# Patient Record
Sex: Female | Born: 1935
Health system: Southern US, Community
[De-identification: ages and names within clinical notes are randomized; demographics above are authoritative.]

## PROBLEM LIST (undated history)

## (undated) DIAGNOSIS — M1990 Unspecified osteoarthritis, unspecified site: Secondary | ICD-10-CM

## (undated) DIAGNOSIS — I4891 Unspecified atrial fibrillation: Secondary | ICD-10-CM

## (undated) DIAGNOSIS — I071 Rheumatic tricuspid insufficiency: Secondary | ICD-10-CM

## (undated) DIAGNOSIS — K219 Gastro-esophageal reflux disease without esophagitis: Secondary | ICD-10-CM

## (undated) DIAGNOSIS — E871 Hypo-osmolality and hyponatremia: Secondary | ICD-10-CM

## (undated) DIAGNOSIS — J449 Chronic obstructive pulmonary disease, unspecified: Secondary | ICD-10-CM

## (undated) DIAGNOSIS — I340 Nonrheumatic mitral (valve) insufficiency: Secondary | ICD-10-CM

## (undated) DIAGNOSIS — K469 Unspecified abdominal hernia without obstruction or gangrene: Secondary | ICD-10-CM

## (undated) DIAGNOSIS — I10 Essential (primary) hypertension: Secondary | ICD-10-CM

## (undated) DIAGNOSIS — I509 Heart failure, unspecified: Secondary | ICD-10-CM

## (undated) HISTORY — DX: Unspecified atrial fibrillation: I48.91

## (undated) HISTORY — DX: Rheumatic tricuspid insufficiency: I07.1

## (undated) HISTORY — PX: ABDOMINAL HYSTERECTOMY: SHX81

## (undated) HISTORY — DX: Gastro-esophageal reflux disease without esophagitis: K21.9

## (undated) HISTORY — PX: OTHER SURGICAL HISTORY: SHX169

## (undated) HISTORY — DX: Chronic obstructive pulmonary disease, unspecified: J44.9

## (undated) HISTORY — DX: Nonrheumatic mitral (valve) insufficiency: I34.0

## (undated) HISTORY — DX: Unspecified osteoarthritis, unspecified site: M19.90

## (undated) HISTORY — DX: Hypo-osmolality and hyponatremia: E87.1

## (undated) HISTORY — DX: Morbid (severe) obesity due to excess calories: E66.01

---

## 2011-01-17 DIAGNOSIS — I4891 Unspecified atrial fibrillation: Secondary | ICD-10-CM

## 2011-01-21 ENCOUNTER — Observation Stay (HOSPITAL_COMMUNITY)
Admission: EM | Admit: 2011-01-21 | Discharge: 2011-01-23 | Disposition: A | Discharge status: Home / Self Care | Payer: Medicare Other | Attending: Internal Medicine | Admitting: Internal Medicine

## 2011-01-21 ENCOUNTER — Emergency Department (HOSPITAL_COMMUNITY): Payer: Medicare Other

## 2011-01-21 DIAGNOSIS — R143 Flatulence: Secondary | ICD-10-CM | POA: Insufficient documentation

## 2011-01-21 DIAGNOSIS — I4891 Unspecified atrial fibrillation: Secondary | ICD-10-CM | POA: Insufficient documentation

## 2011-01-21 DIAGNOSIS — R0609 Other forms of dyspnea: Secondary | ICD-10-CM | POA: Insufficient documentation

## 2011-01-21 DIAGNOSIS — M6281 Muscle weakness (generalized): Secondary | ICD-10-CM | POA: Insufficient documentation

## 2011-01-21 DIAGNOSIS — Z7901 Long term (current) use of anticoagulants: Secondary | ICD-10-CM | POA: Insufficient documentation

## 2011-01-21 DIAGNOSIS — K5900 Constipation, unspecified: Secondary | ICD-10-CM | POA: Insufficient documentation

## 2011-01-21 DIAGNOSIS — R197 Diarrhea, unspecified: Secondary | ICD-10-CM | POA: Insufficient documentation

## 2011-01-21 DIAGNOSIS — R141 Gas pain: Secondary | ICD-10-CM | POA: Insufficient documentation

## 2011-01-21 DIAGNOSIS — N179 Acute kidney failure, unspecified: Secondary | ICD-10-CM | POA: Insufficient documentation

## 2011-01-21 DIAGNOSIS — I10 Essential (primary) hypertension: Secondary | ICD-10-CM | POA: Insufficient documentation

## 2011-01-21 DIAGNOSIS — R1013 Epigastric pain: Principal | ICD-10-CM | POA: Insufficient documentation

## 2011-01-21 DIAGNOSIS — R0989 Other specified symptoms and signs involving the circulatory and respiratory systems: Secondary | ICD-10-CM | POA: Insufficient documentation

## 2011-01-21 DIAGNOSIS — I8390 Asymptomatic varicose veins of unspecified lower extremity: Secondary | ICD-10-CM | POA: Insufficient documentation

## 2011-01-21 DIAGNOSIS — R142 Eructation: Secondary | ICD-10-CM | POA: Insufficient documentation

## 2011-01-21 HISTORY — DX: Essential (primary) hypertension: I10

## 2011-01-21 HISTORY — DX: Unspecified abdominal hernia without obstruction or gangrene: K46.9

## 2011-01-21 HISTORY — DX: Heart failure, unspecified: I50.9

## 2011-01-21 LAB — BASIC METABOLIC PANEL
BUN: 22 mg/dL (ref 6–23)
Chloride: 96 mEq/L (ref 96–112)
Glucose, Bld: 144 mg/dL — ABNORMAL HIGH (ref 70–99)
Potassium: 3.6 mEq/L (ref 3.5–5.1)

## 2011-01-21 LAB — DIFFERENTIAL
Eosinophils Relative: 0 % (ref 0–5)
Lymphocytes Relative: 19 % (ref 12–46)
Lymphs Abs: 1.9 10*3/uL (ref 0.7–4.0)
Monocytes Relative: 10 % (ref 3–12)

## 2011-01-21 LAB — CBC
HCT: 43.7 % (ref 36.0–46.0)
MCV: 91.8 fL (ref 78.0–100.0)
RBC: 4.76 MIL/uL (ref 3.87–5.11)
RDW: 13.2 % (ref 11.5–15.5)
WBC: 9.6 10*3/uL (ref 4.0–10.5)

## 2011-01-21 LAB — HEPATIC FUNCTION PANEL
ALT: 61 U/L — ABNORMAL HIGH (ref 0–35)
AST: 53 U/L — ABNORMAL HIGH (ref 0–37)
Albumin: 4.3 g/dL (ref 3.5–5.2)
Alkaline Phosphatase: 52 U/L (ref 39–117)
Total Protein: 8.3 g/dL (ref 6.0–8.3)

## 2011-01-21 LAB — POCT CARDIAC MARKERS: Troponin i, poc: <0.05 ng/mL (ref 0.00–0.09)

## 2011-01-22 ENCOUNTER — Emergency Department (HOSPITAL_COMMUNITY): Payer: Medicare Other

## 2011-01-22 ENCOUNTER — Encounter (HOSPITAL_COMMUNITY): Payer: Self-pay | Admitting: Radiology

## 2011-01-22 LAB — BASIC METABOLIC PANEL
CO2: 27 mEq/L (ref 19–32)
Chloride: 94 mEq/L — ABNORMAL LOW (ref 96–112)
GFR calc Af Amer: 57 mL/min — ABNORMAL LOW (ref >60)
Potassium: 3 mEq/L — ABNORMAL LOW (ref 3.5–5.1)

## 2011-01-22 LAB — URINE MICROSCOPIC-ADD ON

## 2011-01-22 LAB — URINALYSIS, ROUTINE W REFLEX MICROSCOPIC
Ketones, ur: 15 mg/dL — AB
Leukocytes, UA: NEGATIVE
Nitrite: NEGATIVE
Specific Gravity, Urine: 1.015 (ref 1.005–1.030)
pH: 8 (ref 5.0–8.0)

## 2011-01-22 LAB — DIFFERENTIAL
Basophils Relative: 0 % (ref 0–1)
Eosinophils Absolute: 0.1 10*3/uL (ref 0.0–0.7)
Lymphocytes Relative: 16 % (ref 12–46)
Monocytes Absolute: 1.7 10*3/uL — ABNORMAL HIGH (ref 0.1–1.0)

## 2011-01-22 LAB — CBC
Hemoglobin: 14.2 g/dL (ref 12.0–15.0)
MCH: 30.9 pg (ref 26.0–34.0)
MCV: 91.9 fL (ref 78.0–100.0)
Platelets: 211 10*3/uL (ref 150–400)
RBC: 4.59 MIL/uL (ref 3.87–5.11)
WBC: 12.5 10*3/uL — ABNORMAL HIGH (ref 4.0–10.5)

## 2011-01-22 MED ORDER — IOHEXOL 300 MG/ML  SOLN
100.0000 mL | Freq: Once | INTRAMUSCULAR | Status: AC | PRN
  Administered 2011-01-22: 100 mL via INTRAVENOUS

## 2011-01-23 ENCOUNTER — Encounter: Payer: Self-pay | Admitting: *Deleted

## 2011-01-23 LAB — CBC
HCT: 41.2 % (ref 36.0–46.0)
MCH: 31.7 pg (ref 26.0–34.0)
MCV: 92.6 fL (ref 78.0–100.0)
RBC: 4.45 MIL/uL (ref 3.87–5.11)
WBC: 13.8 10*3/uL — ABNORMAL HIGH (ref 4.0–10.5)

## 2011-01-23 LAB — BASIC METABOLIC PANEL
CO2: 27 mEq/L (ref 19–32)
Chloride: 95 mEq/L — ABNORMAL LOW (ref 96–112)
GFR calc Af Amer: 36 mL/min — ABNORMAL LOW (ref >60)
Glucose, Bld: 131 mg/dL — ABNORMAL HIGH (ref 70–99)
Potassium: 4 mEq/L (ref 3.5–5.1)
Sodium: 132 mEq/L — ABNORMAL LOW (ref 135–145)

## 2011-01-24 NOTE — Discharge Summary (Signed)
[name], [name] NO.:  [id number]  MEDICAL RECORD NO.:  [id number]           PATIENT TYPE:  O  LOCATION:  6739                         FACILITY:  [facility]  PHYSICIAN:  Andreas Blower, MD       DATE OF BIRTH:  1931-10-28  DATE OF ADMISSION:  01/21/2011 DATE OF DISCHARGE:  01/23/2011                              DISCHARGE SUMMARY   PRIMARY CARE PHYSICIAN:  The patient does not have one, is planning on scheduling an appointment with Dr. Margo Aye.  DISCHARGE DIAGNOSES: 1. Abdominal pain, etiology unclear, maybe due to constipation. 2. Constipation. 3. Atrial fibrillation. 4. Hypertension. 5. Acute renal failure, likely due to contrast-induced nephropathy. 6. Possible diagnosis of congestive heart failure, uncertain if it is     diastolic or systolic.  She was diagnosed recently at [facility]  Records are unavailable.  DISCHARGE MEDICATIONS: 1. Robitussin DM 100-10 mg per 5 mL, 5 mL every 6 hours as needed for     cough. 2. Lisinopril 5 mg p.o. daily. 3. Polyethylene glycol 17 g p.o. daily for constipation, hold if any     diarrhea. 4. Senna 1 tablet p.o. daily at bedtime for constipation, hold if any     diarrhea. 5. Simethicone 80 mg p.o. 3 times daily as needed for abdominal pain     and distention. 6. Flonase nasal spray 1 spray daily as needed for allergies. 7. Furosemide 20 mg p.o. daily. 8. Metoprolol 25 mg p.o. twice daily. 9. Potassium chloride 20 mEq p.o. daily. 10.Albuterol 2 puffs every 4 hours as needed. 11.Warfarin 5 mg p.o. daily.  BRIEF ADMITTING HISTORY AND PHYSICAL:  [name] is a 77 year old Caucasian female with history of hypertension, recent diagnosis of AFib, and congestive heart failure who presented with abdominal distention and belching for the last 3-4 days.  RADIOLOGY/IMAGING: 1. The patient had CT of abdomen and pelvis which showed cardiomegaly     without congestive heart failure.  Left base scar probably  atelectasis of the right lung base. 2. The patient had abdominal x-ray on January 22, 2011, which showed     nonobstructive bowel-gas pattern. 3. The patient had CT of the abdomen and pelvis with contrast which     showed abnormal left nephrogram suggesting pyelonephritis and no     acute changes identified.  LABORATORY DATA:  CBC shows a white count of 13.8, hemoglobin 14.1, hematocrit 41.2, and platelet count 218.  INR 1.51.  Electrolytes showed sodium is 132, potassium 4.0, chloride 96, CO2 of 27, BUN 30, and creatinine 1.68.  Initial creatinine on presentation was 1.18.  Liver function tests normal except AST was 53 and ALT is 61.  Magnesium is 2.1.  BNP is 298.  UA was negative for nitrites and leukocytes.  HOSPITAL COURSE BY PROBLEM: 1. Abdominal pain, etiology unclear.  The patient has had CT of the     abdomen and pelvis which did show any significant findings.  Her CT     did show abnormal left pyelogram suggesting pyelonephritis,     however, her UA was negative for nitrites and leukocytes.  The  patient did not complain of any flank pain or any dysuria.  The     patient initially was empirically started on ciprofloxacin,     however, this was discontinued given that the patient did not have     any other symptoms to suggest pyelonephritis.  For her abdominal     pain, she was also started on MiraLax and senna.  She was also     given simethicone.  During the course hospital stay, her abdominal     pain and distention significantly improved.  I instructed the     patient that she should follow up with her primary care physician.     If the patient continues to have abdominal symptoms, could consider     referral to GI for further workup and evaluation. 2. AFib, new diagnosis at [facility].  The patient's heart rate     was controlled.  Continue the patient on Coumadin.  At the time of     discharge, her INR is 1.51. 3. Hypertension.  The patient's blood pressure has  been in the low     100s.  As a result, her hydrochlorothiazide has been discontinued     and her lisinopril dose was also decreased from 20 mg to 5 mg.     Further titration of antihypertensive medications to be done as an     outpatient.  The patient was continued on metoprolol 25 mg twice     daily. 4. Acute renal failure.  The raise in creatinine is most likely due to     the CT of the abdomen and pelvis that she received with IV     contrast.  I suspect her renal failure is due to contrast-induced     nephropathy.  The patient was instructed to adequately hydrate     herself and to have the lab work checked at the next clinic visit.     Have lab work checked at the next clinic visit with her primary     care physician.  DISPOSITION AND FOLLOWUP:  The patient to follow with Dr. Margo Aye.  She was given Dr. Scharlene Gloss telephone number to call and make an appointment.  Home health PT will be arranged for her.  The patient was also instructed that given her rising creatinine she should have her renal panel checked at the next clinic visit.  Time spent on discharge talking to the patient, talking to the patient's son, and coordinating care was 35 minutes.     Andreas Blower, MD     SR/MEDQ  D:  01/23/2011  T:  01/24/2011  Job:  [id number]  Electronically Signed by Wardell Heath Zaeem Kandel  on 01/24/2011 08:40:58 PM

## 2011-01-27 NOTE — H&P (Signed)
NAMEELLISON, LEISURE NO.:  [id number]  MEDICAL RECORD NO.:  [id number]           PATIENT TYPE:  LOCATION:                                 FACILITY:  PHYSICIAN:  Jeoffrey Massed, MD    DATE OF BIRTH:  1932-04-26  DATE OF ADMISSION: DATE OF DISCHARGE:                             HISTORY & PHYSICAL   PRIMARY CARE PRACTITIONER:  The patient does not have one.  CHIEF COMPLAINT:  Belching, abdominal distention x3-4 days.  HISTORY OF PRESENT ILLNESS:  The patient is a 77 year old female with past medical history of hypertension recently diagnosed with CHF and atrial fibrillation, comes to the hospital with the above-noted complaints.  Per the patient, this all started approximately a week ago when she started having weakness, abdominal distention, and pain along with belching and burping.  She went to [facility] where she was diagnosed with atrial fibrillation and CHF.  She was then given Coumadin and Lovenox and discharged home.  While over at [facility], the patient was noted to have excessive belching, burping, and abdominal distention.  She was apparently not having any vomiting or constipation.  Upon discharge, the patient continued to have symptoms of belching and burping.  She therefore went to [facility] and now was evaluated there and discharged.  She now comes to [facility] for similar complaints.  She claims that she has vomited as well as today. She has had no appetite.  She had very small bowel movements today.  She also is passing of flatus.  The patient claims that her abdomen and she is not even able to put her regular sized pants on as well, so at this point the patient is being admitted for further workup and evaluation. The patient also has been complaining of abdominal pain and claims that every time she presses on her belly she gets burps.  She has no fever. She has no diarrhea.  There is no chest pain.  There  is no shortness of breath.  There are no headaches.  ALLERGIES:  None.  PAST MEDICAL HISTORY: 1. Hypertension. 2. Arthritis. 3. Recent diagnosis of atrial fibrillation, apparently is on     overlapping Lovenox and Coumadin. 4. Was told at [facility] that she might have some CHF as well.  PAST SURGICAL HISTORY:  The patient has had a hysterectomy and C- section.  Medications at home include the following: 1. Ventolin inhaler 2 puffs inhaled q.4 p.r.n. 2. Flonase 1 spray daily p.r.n. 3. Lopressor 25 mg 1 tablet twice daily. 4. Potassium chloride 20 mEq 1 tablet daily. 5. Lasix 20 mg 1 tablet daily. 6. Lisinopril/hydrochlorothiazide 10/12.5 one tablet daily. 7. Coumadin 5 mg 1 tablet daily.  FAMILY HISTORY:  Noncontributory.  SOCIAL HISTORY:  Nonsmoker, lives alone.  Denies any toxic habits.  REVIEW OF SYSTEMS:  A detailed review of 12 systems was done and these are negative except for the ones mentioned in the HPI.  PHYSICAL EXAMINATION:  VITAL SIGNS:  Afebrile, heart rate of 76, blood 129/93, respiration of 20. GENERAL:  Lying in bed, does not appear to be in any discomfort, is  burping during my evaluation. HEENT:  Atraumatic, normocephalic.  Pupils equally react to light and accommodation. NECK:  Supple. CHEST:  Bilaterally clear to auscultation. CARDIOVASCULAR:  Heart sounds are irregular, slightly tachycardic.  No murmurs heard. ABDOMEN:  Slightly distended.  Bowel sounds are present.  There is mild but diffuse tenderness without any rebound or rigidity. EXTREMITIES:  No edema, plus chronic varicose veins present. NEUROLOGIC:  The patient is alert and oriented and has no focal neurological deficits on exam.  LABORATORY DATA:  CBC shows a WBC of 9.6, hemoglobin of 15.2, hematocrit of 43.7, and a platelet count of 233,000.  Chemistry shows sodium of 133, potassium of 3.6, chloride of 96, bicarb of 24, glucose of 144, BUN of 22, creatinine of 1.18, and a calcium of 9.7.   BNP is 298.  INR 1.16. LFT shows AST of 53, ALT of 61, alkaline phosphatase of 52, albumin of 4.3.  point-of-care cardiac markers are negative. Abdominal x-ray shows a nonobstructive bowel gas pattern.  X-ray of the chest shows cardiomegaly without congestive failure.  Left base scar with possible atelectasis of the right lung base.  If there is strong concern for right lower lobe pneumonia. Short-term radiographic follow up should be considered.  ASSESSMENT: 1. Burping, belching, abdominal distention, rule out constipation,     rule out ileus. 2. Atrial fibrillation with the heart rate being in the 90s-100s     range. 3. Hypertension currently controlled. 4. On Coumadin therapy for atrial fibrillation with a subtherapeutic     INR.  PLAN: 1. The patient will be brought in as an observation. 2. In regard to atrial fibrillation, she will be continue on Lopressor     and we will place her on a heparin drip and only resume Coumadin on     discharge if no significant findings seen on the CT of the abdomen     and no procedures contemplated. 3. In regard to her belching and burping, we will get a CT scan of her     abdomen to make sure she does not have ileus or an occult     obstruction as these symptoms have been persistent for a week and     have not gotten better with outpatient therapy. 4. We will provide her with some stool softeners and see if these     symptoms resolve with bowel movements. 5. Further plan will depend as the patient's clinical course evolves.     Further radiological studies are obtained. 6. An echocardiogram will also be obtained. 7. The patient apparently has been scheduled to see [facility]     at [facility] for atrial fibrillation and will need to do so on     discharge. 8. DVT prophylaxis is not needed as the patient will be on heparin     drip.  CODE STATUS:  Full code.  TOTAL TIME SPENT:  Forty five minutes.     Jeoffrey Massed,  MD     SG/MEDQ  D:  01/22/2011  T:  01/22/2011  Job:  [id number]  Electronically Signed by Jeoffrey Massed  on 01/27/2011 08:34:26 PM

## 2011-01-31 ENCOUNTER — Ambulatory Visit (INDEPENDENT_AMBULATORY_CARE_PROVIDER_SITE_OTHER): Payer: Medicare Other | Admitting: *Deleted

## 2011-01-31 DIAGNOSIS — Z7901 Long term (current) use of anticoagulants: Secondary | ICD-10-CM

## 2011-01-31 DIAGNOSIS — I4891 Unspecified atrial fibrillation: Secondary | ICD-10-CM | POA: Insufficient documentation

## 2011-01-31 LAB — POCT INR: INR: 1.7

## 2011-02-03 ENCOUNTER — Other Ambulatory Visit: Payer: Self-pay | Admitting: *Deleted

## 2011-02-03 MED ORDER — WARFARIN SODIUM 5 MG PO TABS: 5.0000 mg | ORAL_TABLET | ORAL | Status: DC

## 2011-02-14 ENCOUNTER — Ambulatory Visit (INDEPENDENT_AMBULATORY_CARE_PROVIDER_SITE_OTHER): Payer: Medicare Other | Admitting: *Deleted

## 2011-02-14 DIAGNOSIS — I4891 Unspecified atrial fibrillation: Secondary | ICD-10-CM

## 2011-02-14 DIAGNOSIS — Z7901 Long term (current) use of anticoagulants: Secondary | ICD-10-CM

## 2011-02-14 LAB — POCT INR: INR: 2.8

## 2011-02-18 ENCOUNTER — Ambulatory Visit (INDEPENDENT_AMBULATORY_CARE_PROVIDER_SITE_OTHER): Payer: Medicare Other | Admitting: Cardiology

## 2011-02-18 ENCOUNTER — Encounter: Payer: Self-pay | Admitting: Cardiology

## 2011-02-18 ENCOUNTER — Encounter: Payer: Self-pay | Admitting: *Deleted

## 2011-02-18 DIAGNOSIS — IMO0002 Reserved for concepts with insufficient information to code with codable children: Secondary | ICD-10-CM | POA: Insufficient documentation

## 2011-02-18 DIAGNOSIS — I071 Rheumatic tricuspid insufficiency: Secondary | ICD-10-CM

## 2011-02-18 DIAGNOSIS — I059 Rheumatic mitral valve disease, unspecified: Secondary | ICD-10-CM

## 2011-02-18 DIAGNOSIS — I10 Essential (primary) hypertension: Secondary | ICD-10-CM

## 2011-02-18 DIAGNOSIS — I079 Rheumatic tricuspid valve disease, unspecified: Secondary | ICD-10-CM

## 2011-02-18 DIAGNOSIS — I509 Heart failure, unspecified: Secondary | ICD-10-CM

## 2011-02-18 DIAGNOSIS — I4891 Unspecified atrial fibrillation: Secondary | ICD-10-CM

## 2011-02-18 DIAGNOSIS — I340 Nonrheumatic mitral (valve) insufficiency: Secondary | ICD-10-CM

## 2011-02-18 MED ORDER — FLUTICASONE PROPIONATE 50 MCG/ACT NA SUSP: 2.0000 | Freq: Every day | NASAL | Status: DC

## 2011-02-18 MED ORDER — LISINOPRIL 5 MG PO TABS: 5.0000 mg | ORAL_TABLET | Freq: Every day | ORAL | Status: DC

## 2011-02-18 MED ORDER — POTASSIUM CHLORIDE 20 MEQ/15ML (10%) PO LIQD: 20.0000 meq | Freq: Every day | ORAL | Status: AC

## 2011-02-18 MED ORDER — RANITIDINE HCL 150 MG PO TABS: 150.0000 mg | ORAL_TABLET | Freq: Every day | ORAL | Status: DC

## 2011-02-18 MED ORDER — CARVEDILOL 3.125 MG PO TABS: 3.1250 mg | ORAL_TABLET | Freq: Two times a day (BID) | ORAL | Status: DC

## 2011-02-18 NOTE — Assessment & Plan Note (Signed)
I will follow this up with an echocardiogram along with mitral regurgitation in one year.

## 2011-02-18 NOTE — Assessment & Plan Note (Addendum)
Stable at this time.  Pt is euvolemic on exam.  She will continue lisinopril, lopressor has been changed to coreg, potassium and lasix.  Continue low sodium diet.  Will follow.   Of note the patient does not want to have a cardiac catheterization and we again discussed this.

## 2011-02-18 NOTE — Progress Notes (Deleted)
HPI   No Known Allergies  Current Outpatient Prescriptions  Medication Sig Dispense Refill  . albuterol (PROVENTIL HFA;VENTOLIN HFA) 108 (90 BASE) MCG/ACT inhaler Inhale 2 puffs into the lungs every 4 (four) hours as needed.        . fluticasone (FLONASE) 50 MCG/ACT nasal spray 2 sprays by Nasal route daily.        Marland Kitchen lisinopril (PRINIVIL,ZESTRIL) 5 MG tablet Take 5 mg by mouth daily.        . metoprolol tartrate (LOPRESSOR) 25 MG tablet Take 25 mg by mouth 2 (two) times daily.        . polyethylene glycol (MIRALAX / GLYCOLAX) packet Take 17 g by mouth daily.        . potassium chloride SA (K-DUR,KLOR-CON) 20 MEQ tablet Take 20 mEq by mouth daily.        Marland Kitchen senna (SENOKOT) 8.6 MG tablet Take 1 tablet by mouth daily.        . simethicone (MYLICON) 80 MG chewable tablet Chew 80 mg by mouth 3 (three) times daily as needed.        . warfarin (COUMADIN) 5 MG tablet Take 1 tablet (5 mg total) by mouth as directed.  45 tablet  3  . DISCONTD: furosemide (LASIX) 20 MG tablet Take 20 mg by mouth daily.        Marland Kitchen DISCONTD: guaiFENesin-dextromethorphan (ROBITUSSIN DM) 100-10 MG/5ML syrup Take 5 mLs by mouth every 6 (six) hours as needed.          Past Medical History  Diagnosis Date  . CHF (congestive heart failure)     EF  30%  Echo 01/17/11  . Hypertension   . Atrial fib/flutter, transient   . Hernia   . Hyposmolality and/or hyponatremia   . Esophageal reflux   . Chronic airway obstruction, not elsewhere classified   . Morbid obesity   . Osteoarthrosis, unspecified whether generalized or localized, unspecified site   . Mitral regurgitation   . Moderate tricuspid regurgitation by prior echocardiogram     Past Surgical History  Procedure Date  . Abdominal hysterectomy   . Right lower extremity surgery     ROS:  Nasal congestion.  Otherwise negative for all other systems.  PHYSICAL EXAM BP 108/75  Pulse 74  Ht 5\' 5"  (1.651 m)  Wt 193 lb (87.544 kg)  BMI 32.12 kg/m2 GENERAL:  Well  appearing HEENT:  Pupils equal round and reactive, fundi not visualized, oral mucosa unremarkable NECK:  No jugular venous distention, waveform within normal limits, carotid upstroke brisk and symmetric, no bruits, no thyromegaly LYMPHATICS:  No cervical, inguinal adenopathy LUNGS:  Clear to auscultation bilaterally BACK:  No CVA tenderness CHEST:  Unremarkable HEART:  PMI not displaced or sustained,S1 and S2 within normal limits, no S3, no S4, no clicks, no rubs, no murmurs ABD:  Flat, positive bowel sounds normal in frequency in pitch, no bruits, no rebound, no guarding, no midline pulsatile mass, no hepatomegaly, no splenomegaly EXT:  2 plus pulses throughout, no edema, no cyanosis no clubbing SKIN:  No rashes no nodules NEURO:  Cranial nerves II through XII grossly intact, motor grossly intact throughout PSYCH:  Cognitively intact, oriented to person place and time  EKG:  Atrial fibrillation, right bundle branch block, anterior T-wave inversion unchanged from previous  ASSESSMENT AND PLAN

## 2011-02-18 NOTE — Assessment & Plan Note (Addendum)
Stable at this time.  Pt will need repeat echo in 12 months.

## 2011-02-18 NOTE — Progress Notes (Signed)
HPI This is a 77 year old female with newly diagnosed Afib on 01/17/11 while being evaluated for increased SOB at [facility].  The pt was placed on coumadin and metoprolol for rate control.  A 2D echo showed an ejection fraction of 30-35% as well as Mod MR/TR.  Further evaluation for etiology of cardiomyopathy with cardiac catherization to exclude ischemic cause were discussed with the patient and she did not wish to pursue catherization during that time.    The patient has been doing well since discharge.  She has had physical therapy visits and feel they are helping with strength.  She is completing daily exercises without CP or increased SOB.  She walks approx 260 feet twice daily.  Her dyspnea is back at baseline per patient.  She denies any DOE, orthopnea, PND or lower extremity edema.  She continues to sleep on two pillows but this is per usual.  Patient denies any chest pain/pressure.  She denies recent fever/chills but does complain of nasal congestion.  Her constipation and abdominal pain has improved immensely with the addition of Zantac 150 mg daily.    Pt is seen today for hospital follow up.    No Known Allergies  Current Outpatient Prescriptions  Medication Sig Dispense Refill  . albuterol (PROVENTIL HFA;VENTOLIN HFA) 108 (90 BASE) MCG/ACT inhaler Inhale 2 puffs into the lungs every 4 (four) hours as needed.        . fluticasone (FLONASE) 50 MCG/ACT nasal spray 2 sprays by Nasal route daily.        Marland Kitchen lisinopril (PRINIVIL,ZESTRIL) 5 MG tablet Take 5 mg by mouth daily.        . metoprolol tartrate (LOPRESSOR) 25 MG tablet Take 25 mg by mouth 2 (two) times daily.        . polyethylene glycol (MIRALAX / GLYCOLAX) packet Take 17 g by mouth daily.        . potassium chloride SA (K-DUR,KLOR-CON) 20 MEQ tablet Take 20 mEq by mouth daily.        Marland Kitchen senna (SENOKOT) 8.6 MG tablet Take 1 tablet by mouth daily.        . simethicone (MYLICON) 80 MG chewable tablet Chew 80 mg by mouth 3 (three)  times daily as needed.        . warfarin (COUMADIN) 5 MG tablet Take 1 tablet (5 mg total) by mouth as directed.  45 tablet  3  . DISCONTD: furosemide (LASIX) 20 MG tablet Take 20 mg by mouth daily.        Marland Kitchen DISCONTD: guaiFENesin-dextromethorphan (ROBITUSSIN DM) 100-10 MG/5ML syrup Take 5 mLs by mouth every 6 (six) hours as needed.          Past Medical History  Diagnosis Date  . CHF (congestive heart failure)     EF  30%  Echo 01/17/11  . Hypertension   . Atrial fib/flutter, transient   . Hernia   . Hyposmolality and/or hyponatremia   . Esophageal reflux   . Chronic airway obstruction, not elsewhere classified   . Morbid obesity   . Osteoarthrosis, unspecified whether generalized or localized, unspecified site   . Mitral regurgitation   . Moderate tricuspid regurgitation by prior echocardiogram     Past Surgical History  Procedure Date  . Abdominal hysterectomy   . Right lower extremity surgery     ROS: Nasal Congestion.  All other systems reviewed and are negative.   PHYSICAL EXAM BP 108/75  Pulse 74  Ht 5\' 5"  (1.651  m)  Wt 193 lb (87.544 kg)  BMI 32.12 kg/m2 GENERAL:  Well appearing HEENT:  Pupils equal round and reactive, fundi not visualized, oral mucosa unremarkable NECK:  No jugular venous distention, waveform within normal limits, carotid upstroke brisk and symmetric, no bruits, no thyromegaly LYMPHATICS:  No cervical, inguinal adenopathy LUNGS:  Clear to auscultation bilaterally BACK:  No CVA tenderness CHEST:  Unremarkable HEART:  PMI not displaced or sustained,S1 and S2 within normal limits, no S3, no S4, no clicks, no rubs, 2/6 murmur apical  ABD:  Flat, positive bowel sounds normal in frequency in pitch, no bruits, no rebound, no guarding, no midline pulsatile mass, no hepatomegaly, no splenomegaly, obese EXT:  2 plus pulses throughout, no edema, no cyanosis no clubbing SKIN:  No rashes no nodules NEURO:  Cranial nerves II through XII grossly intact, motor  grossly intact throughout PSYCH:  Cognitively intact, oriented to person place and time  EKG:  Atrial fibrillation, right bundle branch block, no change from previous  ASSESSMENT AND PLAN

## 2011-02-18 NOTE — Progress Notes (Deleted)
   Patient ID: [name], female    DOB: Jul 19, 1932, 77 y.o.   MRN: [id number]  HPI    Review of Systems    Physical Exam

## 2011-02-18 NOTE — Patient Instructions (Addendum)
   Follow up in 6 weeks as scheduled.  Start Coreg (carvedilol) 3.125mg  twice per day.  Stop Lopressor (metoprolol).  Start Zantax 150mg  daily.

## 2011-02-18 NOTE — Assessment & Plan Note (Signed)
Stable.  Pt will continue current antihypertensives.  Lopressor will be changed to coreg. And

## 2011-02-18 NOTE — Assessment & Plan Note (Addendum)
Pt remains in Afib, currently rate controlled.  At this time pt will continue coumadin with a CHADS2 score of at least 2.  I will change her lopressor to coreg for better rate control.  She is tolerating her afib well and cardioversion is not needed.  Patient has again refused catherization at the present time but will reconsider if symptoms worsen, I feel this is adequate.  If Afib persist can consider event monitor for further monitoring.

## 2011-03-04 ENCOUNTER — Ambulatory Visit (INDEPENDENT_AMBULATORY_CARE_PROVIDER_SITE_OTHER): Payer: Medicare Other | Admitting: *Deleted

## 2011-03-04 DIAGNOSIS — Z7901 Long term (current) use of anticoagulants: Secondary | ICD-10-CM

## 2011-03-04 DIAGNOSIS — I4891 Unspecified atrial fibrillation: Secondary | ICD-10-CM

## 2011-03-09 ENCOUNTER — Emergency Department (HOSPITAL_COMMUNITY)
Admission: EM | Admit: 2011-03-09 | Discharge: 2011-03-09 | Disposition: A | Discharge status: Home / Self Care | Payer: Medicare Other | Attending: Emergency Medicine | Admitting: Emergency Medicine

## 2011-03-09 ENCOUNTER — Emergency Department (HOSPITAL_COMMUNITY): Payer: Medicare Other

## 2011-03-09 DIAGNOSIS — Z79899 Other long term (current) drug therapy: Secondary | ICD-10-CM | POA: Insufficient documentation

## 2011-03-09 DIAGNOSIS — I4891 Unspecified atrial fibrillation: Secondary | ICD-10-CM | POA: Insufficient documentation

## 2011-03-09 DIAGNOSIS — I509 Heart failure, unspecified: Secondary | ICD-10-CM | POA: Insufficient documentation

## 2011-03-09 DIAGNOSIS — Z7901 Long term (current) use of anticoagulants: Secondary | ICD-10-CM | POA: Insufficient documentation

## 2011-03-09 DIAGNOSIS — I10 Essential (primary) hypertension: Secondary | ICD-10-CM | POA: Insufficient documentation

## 2011-03-09 DIAGNOSIS — M109 Gout, unspecified: Secondary | ICD-10-CM | POA: Insufficient documentation

## 2011-03-09 LAB — CBC
HCT: 39.3 % (ref 36.0–46.0)
Hemoglobin: 13.3 g/dL (ref 12.0–15.0)
MCH: 30.9 pg (ref 26.0–34.0)
MCV: 91.4 fL (ref 78.0–100.0)
RBC: 4.3 MIL/uL (ref 3.87–5.11)
WBC: 11.7 10*3/uL — ABNORMAL HIGH (ref 4.0–10.5)

## 2011-03-09 LAB — DIFFERENTIAL
Lymphocytes Relative: 20 % (ref 12–46)
Lymphs Abs: 2.3 10*3/uL (ref 0.7–4.0)
Monocytes Relative: 13 % — ABNORMAL HIGH (ref 3–12)
Neutrophils Relative %: 67 % (ref 43–77)

## 2011-03-09 LAB — BASIC METABOLIC PANEL
CO2: 21 mEq/L (ref 19–32)
Calcium: 10.2 mg/dL (ref 8.4–10.5)
Chloride: 95 mEq/L — ABNORMAL LOW (ref 96–112)
GFR calc Af Amer: >60 mL/min (ref >60)
Glucose, Bld: 131 mg/dL — ABNORMAL HIGH (ref 70–99)
Potassium: 3.8 mEq/L (ref 3.5–5.1)
Sodium: 128 mEq/L — ABNORMAL LOW (ref 135–145)

## 2011-03-27 ENCOUNTER — Encounter: Payer: Self-pay | Admitting: Cardiology

## 2011-04-01 ENCOUNTER — Encounter: Payer: Self-pay | Admitting: Cardiology

## 2011-04-01 ENCOUNTER — Ambulatory Visit (INDEPENDENT_AMBULATORY_CARE_PROVIDER_SITE_OTHER): Payer: Medicare Other | Admitting: *Deleted

## 2011-04-01 DIAGNOSIS — Z7901 Long term (current) use of anticoagulants: Secondary | ICD-10-CM

## 2011-04-01 DIAGNOSIS — I4891 Unspecified atrial fibrillation: Secondary | ICD-10-CM

## 2011-04-01 LAB — POCT INR: INR: 4.1

## 2011-04-08 ENCOUNTER — Ambulatory Visit (INDEPENDENT_AMBULATORY_CARE_PROVIDER_SITE_OTHER): Payer: Medicare Other | Admitting: *Deleted

## 2011-04-08 ENCOUNTER — Ambulatory Visit (INDEPENDENT_AMBULATORY_CARE_PROVIDER_SITE_OTHER): Payer: Medicare Other | Admitting: Cardiology

## 2011-04-08 ENCOUNTER — Encounter: Payer: Self-pay | Admitting: Cardiology

## 2011-04-08 VITALS — BP 125/87 | HR 92 | Ht 65.0 in | Wt 193.0 lb

## 2011-04-08 DIAGNOSIS — I059 Rheumatic mitral valve disease, unspecified: Secondary | ICD-10-CM

## 2011-04-08 DIAGNOSIS — I071 Rheumatic tricuspid insufficiency: Secondary | ICD-10-CM

## 2011-04-08 DIAGNOSIS — Z7901 Long term (current) use of anticoagulants: Secondary | ICD-10-CM

## 2011-04-08 DIAGNOSIS — I4891 Unspecified atrial fibrillation: Secondary | ICD-10-CM

## 2011-04-08 DIAGNOSIS — I509 Heart failure, unspecified: Secondary | ICD-10-CM

## 2011-04-08 DIAGNOSIS — I10 Essential (primary) hypertension: Secondary | ICD-10-CM

## 2011-04-08 DIAGNOSIS — I079 Rheumatic tricuspid valve disease, unspecified: Secondary | ICD-10-CM

## 2011-04-08 DIAGNOSIS — I340 Nonrheumatic mitral (valve) insufficiency: Secondary | ICD-10-CM

## 2011-04-08 LAB — POCT INR: INR: 2.2

## 2011-04-08 NOTE — Progress Notes (Signed)
HPI This is a 77 year old female with newly diagnosed Afib on 01/17/11 while being evaluated for increased SOB at [facility].  The pt was placed on coumadin and metoprolol for rate control.  A 2D echo showed an ejection fraction of 30-35% as well as Mod MR/TR.  Further evaluation for etiology of cardiomyopathy with cardiac catherization to exclude ischemic cause were discussed with the patient and she did not wish to pursue catherization during that time.    At the last visit I changed to carvedilol or metoprolol. She did well with this. She's not having any shortness of breath, PND or orthopnea. Dr. Margo Aye increase her carvedilol. She did well with this change. She's not had any presyncope or syncope. She's not having any chest pressure, neck or arm discomfort. She's had no weight gain or edema. She has had gout.    Allergies  Allergen Reactions  . Garlic Rash  . Penicillins     Swelling     Current Outpatient Prescriptions  Medication Sig Dispense Refill  . acetaminophen (TYLENOL) 500 MG tablet Take 500 mg by mouth every 6 (six) hours as needed.        Marland Kitchen albuterol (PROVENTIL HFA;VENTOLIN HFA) 108 (90 BASE) MCG/ACT inhaler Inhale 2 puffs into the lungs every 4 (four) hours as needed.        . carvedilol (COREG) 6.25 MG tablet Take 6.25 mg by mouth 2 (two) times daily with a meal.        . fluticasone (FLONASE) 50 MCG/ACT nasal spray 2 sprays by Nasal route daily.  16 g  0  . lisinopril (PRINIVIL,ZESTRIL) 5 MG tablet Take 1 tablet (5 mg total) by mouth daily.  30 tablet  6  . polyethylene glycol (MIRALAX / GLYCOLAX) packet Take 17 g by mouth daily.        . ranitidine (ZANTAC) 150 MG tablet Take 1 tablet (150 mg total) by mouth daily.  30 tablet  0  . Red Yeast Rice 600 MG CAPS Take 2 capsules by mouth daily.        Marland Kitchen senna (SENOKOT) 8.6 MG tablet Take 1 tablet by mouth daily as needed.       . warfarin (COUMADIN) 5 MG tablet Take 1 tablet (5 mg total) by mouth as directed.  45 tablet  3   . DISCONTD: carvedilol (COREG) 3.125 MG tablet Take 1 tablet (3.125 mg total) by mouth 2 (two) times daily.  60 tablet  6  . DISCONTD: simethicone (MYLICON) 80 MG chewable tablet Chew 80 mg by mouth 3 (three) times daily as needed.          Past Medical History  Diagnosis Date  . CHF (congestive heart failure)     EF  30%  Echo 01/17/11  . Hypertension   . Atrial fib/flutter, transient   . Hernia   . Hyposmolality and/or hyponatremia   . Esophageal reflux   . Chronic airway obstruction, not elsewhere classified   . Morbid obesity   . Osteoarthrosis, unspecified whether generalized or localized, unspecified site   . Mitral regurgitation   . Moderate tricuspid regurgitation by prior echocardiogram     Past Surgical History  Procedure Date  . Abdominal hysterectomy   . Right lower extremity surgery    ROS:  Gout, cough at night non productive.  Otherwise as stated in the HPI and negative for all other systems  PHYSICAL EXAM BP 125/87  Pulse 92  Ht 5\' 5"  (1.651 m)  Wt  193 lb (87.544 kg)  BMI 32.12 kg/m2  SpO2 98% GENERAL:  Well appearing HEENT:  Pupils equal round and reactive, fundi not visualized, oral mucosa unremarkable NECK:  No jugular venous distention, waveform within normal limits, carotid upstroke brisk and symmetric, no bruits, no thyromegaly LYMPHATICS:  No cervical, inguinal adenopathy LUNGS:  Clear to auscultation bilaterally BACK:  No CVA tenderness CHEST:  Unremarkable HEART:  PMI not displaced or sustained,S1 and S2 within normal limits, no S3, no S4, no clicks, no rubs, 2/6 murmur apical irregular ABD:  Flat, positive bowel sounds normal in frequency in pitch, no bruits, no rebound, no guarding, no midline pulsatile mass, no hepatomegaly, no splenomegaly, obese EXT:  2 plus pulses throughout, no edema, no cyanosis no clubbing SKIN:  No rashes no nodules NEURO:  Cranial nerves II through XII grossly intact, motor grossly intact throughout PSYCH:   Cognitively intact, oriented to person place and time  ASSESSMENT AND PLAN

## 2011-04-08 NOTE — Patient Instructions (Signed)
Follow up as scheduled. Your physician recommends that you continue on your current medications as directed. Please refer to the Current Medication list given to you today. 

## 2011-04-08 NOTE — Assessment & Plan Note (Signed)
Her blood pressure will be controlled in the context of treating her cardiomyopathy.

## 2011-04-08 NOTE — Assessment & Plan Note (Signed)
I will defer to Dr. Margo Aye and agree with titrating carvedilol with a next step being 12.5 mg b.i.d. Of note she does have a cough only at night. I would suggest this is unlikely to be ACE inhibitor with consideration given to sinus drainage or reflux although switching to an ARB would be reasonable.

## 2011-04-08 NOTE — Assessment & Plan Note (Signed)
I will followup and echocardiogram once we have achieved and that titration.

## 2011-04-08 NOTE — Assessment & Plan Note (Signed)
She tolerates Coumadin and we will achieve good rate control through titration of her beta blocker.

## 2011-04-25 ENCOUNTER — Ambulatory Visit (INDEPENDENT_AMBULATORY_CARE_PROVIDER_SITE_OTHER): Payer: Medicare Other | Admitting: *Deleted

## 2011-04-25 DIAGNOSIS — Z7901 Long term (current) use of anticoagulants: Secondary | ICD-10-CM

## 2011-04-25 DIAGNOSIS — I4891 Unspecified atrial fibrillation: Secondary | ICD-10-CM

## 2011-04-25 LAB — POCT INR: INR: 2.9

## 2011-05-23 ENCOUNTER — Encounter: Payer: Medicare Other | Admitting: *Deleted

## 2011-05-27 ENCOUNTER — Ambulatory Visit (INDEPENDENT_AMBULATORY_CARE_PROVIDER_SITE_OTHER): Payer: Medicare Other | Admitting: *Deleted

## 2011-05-27 DIAGNOSIS — I4891 Unspecified atrial fibrillation: Secondary | ICD-10-CM

## 2011-05-27 DIAGNOSIS — Z7901 Long term (current) use of anticoagulants: Secondary | ICD-10-CM

## 2011-06-21 ENCOUNTER — Other Ambulatory Visit: Payer: Self-pay | Admitting: Cardiology

## 2011-06-24 ENCOUNTER — Ambulatory Visit (INDEPENDENT_AMBULATORY_CARE_PROVIDER_SITE_OTHER): Payer: Medicare Other | Admitting: *Deleted

## 2011-06-24 DIAGNOSIS — I4891 Unspecified atrial fibrillation: Secondary | ICD-10-CM

## 2011-06-24 DIAGNOSIS — Z7901 Long term (current) use of anticoagulants: Secondary | ICD-10-CM

## 2011-06-24 LAB — POCT INR: INR: 1.7

## 2011-06-24 MED ORDER — WARFARIN SODIUM 5 MG PO TABS: 5.0000 mg | ORAL_TABLET | Freq: Every day | ORAL | Status: DC

## 2011-07-07 ENCOUNTER — Ambulatory Visit (INDEPENDENT_AMBULATORY_CARE_PROVIDER_SITE_OTHER): Payer: Medicare Other | Admitting: *Deleted

## 2011-07-07 ENCOUNTER — Encounter: Payer: Self-pay | Admitting: Cardiology

## 2011-07-07 ENCOUNTER — Ambulatory Visit (INDEPENDENT_AMBULATORY_CARE_PROVIDER_SITE_OTHER): Payer: Medicare Other | Admitting: Cardiology

## 2011-07-07 ENCOUNTER — Ambulatory Visit: Payer: Medicare Other | Admitting: Cardiology

## 2011-07-07 DIAGNOSIS — Z7901 Long term (current) use of anticoagulants: Secondary | ICD-10-CM

## 2011-07-07 DIAGNOSIS — I4891 Unspecified atrial fibrillation: Secondary | ICD-10-CM

## 2011-07-07 DIAGNOSIS — I340 Nonrheumatic mitral (valve) insufficiency: Secondary | ICD-10-CM

## 2011-07-07 DIAGNOSIS — I509 Heart failure, unspecified: Secondary | ICD-10-CM

## 2011-07-07 DIAGNOSIS — I10 Essential (primary) hypertension: Secondary | ICD-10-CM

## 2011-07-07 DIAGNOSIS — IMO0002 Reserved for concepts with insufficient information to code with codable children: Secondary | ICD-10-CM

## 2011-07-07 DIAGNOSIS — I059 Rheumatic mitral valve disease, unspecified: Secondary | ICD-10-CM

## 2011-07-07 MED ORDER — CARVEDILOL 12.5 MG PO TABS: 12.5000 mg | ORAL_TABLET | Freq: Two times a day (BID) | ORAL | Status: DC

## 2011-07-07 NOTE — Assessment & Plan Note (Signed)
Her rate was rapid in the office with minimal activity.  I will titrate the beta blocker.  She tolerates anticoagulation.

## 2011-07-07 NOTE — Patient Instructions (Addendum)
   Increase Coreg (carvedilol) to 12.5 mg two times a day. You may take 2 of your 6.25 mg tablets in the morning and 2 tablets in the pm until gone and then get new prescription filled. Follow up as scheduled with Dr. Antoine Poche in [place].   (Dr. Antoine Poche told pt we would refill all of her meds until she can est w/a new primary MD.)

## 2011-07-07 NOTE — Assessment & Plan Note (Signed)
This is presumed ischemic.  She has deferred any further studies such as cath.  Today I will titrate the Coreg to 12.5 bid

## 2011-07-07 NOTE — Progress Notes (Signed)
HPI This is a 77 year old female with newly diagnosed Afib on 01/17/11 while being evaluated for increased SOB at [facility].  The pt was placed on coumadin and metoprolol for rate control.  A 2D echo showed an ejection fraction of 30-35% as well as Mod MR/TR.  Further evaluation for etiology of cardiomyopathy with cardiac catherization to exclude ischemic cause were discussed with the patient and she did not wish to pursue catherization during that time.    I did not change her meds at the last visit.  We did discuss cough which I did not suspect was related to ARB.  Since that visit she has been treated with OTC cough medication.  She has resolution of this since that time.  She has not been walking because of hot weather.  With her limited activity (she uses two canes) she has had no new symptoms.  The patient denies chest discomfort, neck or arm discomfort. There has been no new shortness of breath, PND or orthopnea. There have been no reported palpitations, presyncope or syncope.   Allergies  Allergen Reactions  . Garlic Rash  . Penicillins     Swelling     Current Outpatient Prescriptions  Medication Sig Dispense Refill  . acetaminophen (TYLENOL) 500 MG tablet Take 500 mg by mouth every 6 (six) hours as needed.        Marland Kitchen albuterol (PROVENTIL HFA;VENTOLIN HFA) 108 (90 BASE) MCG/ACT inhaler Inhale 2 puffs into the lungs every 4 (four) hours as needed.        . carvedilol (COREG) 6.25 MG tablet Take 6.25 mg by mouth 2 (two) times daily with a meal.        . fluticasone (FLONASE) 50 MCG/ACT nasal spray 2 sprays by Nasal route daily.  16 g  0  . lisinopril (PRINIVIL,ZESTRIL) 5 MG tablet Take 1 tablet (5 mg total) by mouth daily.  30 tablet  6  . polyethylene glycol (MIRALAX / GLYCOLAX) packet Take 17 g by mouth daily.        . ranitidine (ZANTAC) 150 MG tablet Take 1 tablet (150 mg total) by mouth daily.  30 tablet  0  . Red Yeast Rice 600 MG CAPS Take 2 capsules by mouth daily.        Marland Kitchen  senna (SENOKOT) 8.6 MG tablet Take 1 tablet by mouth daily as needed.       . warfarin (COUMADIN) 5 MG tablet Take 1 tablet (5 mg total) by mouth daily.  60 tablet  3    Past Medical History  Diagnosis Date  . CHF (congestive heart failure)     EF  30%  Echo 01/17/11  . Hypertension   . Atrial fib/flutter, transient   . Hernia   . Hyposmolality and/or hyponatremia   . Esophageal reflux   . Chronic airway obstruction, not elsewhere classified   . Morbid obesity   . Osteoarthrosis, unspecified whether generalized or localized, unspecified site   . Mitral regurgitation   . Moderate tricuspid regurgitation by prior echocardiogram     Past Surgical History  Procedure Date  . Abdominal hysterectomy   . Right lower extremity surgery    ROS:  Gout, cough at night non productive.  Otherwise as stated in the HPI and negative for all other systems  PHYSICAL EXAM There were no vitals taken for this visit. GENERAL:  Well appearing HEENT:  Pupils equal round and reactive, fundi not visualized, oral mucosa unremarkable NECK:  No jugular venous  distention, waveform within normal limits, carotid upstroke brisk and symmetric, no bruits, no thyromegaly LYMPHATICS:  No cervical, inguinal adenopathy LUNGS:  Clear to auscultation bilaterally BACK:  No CVA tenderness CHEST:  Unremarkable HEART:  PMI not displaced or sustained,S1 and S2 within normal limits, no S3, no S4, no clicks, no rubs, 2/6 murmur apical irregular ABD:  Flat, positive bowel sounds normal in frequency in pitch, no bruits, no rebound, no guarding, no midline pulsatile mass, no hepatomegaly, no splenomegaly, obese EXT:  2 plus pulses throughout, no edema, no cyanosis no clubbing SKIN:  No rashes no nodules NEURO:  Cranial nerves II through XII grossly intact, motor grossly intact throughout PSYCH:  Cognitively intact, oriented to person place and time  Atrial flutter with variable conduction, right bundle branch block, no change  from previous  ASSESSMENT AND PLAN

## 2011-07-07 NOTE — Assessment & Plan Note (Signed)
This is being treated in the context of treating her cardiomyopathy.

## 2011-07-22 ENCOUNTER — Encounter: Payer: Medicare Other | Admitting: *Deleted

## 2011-07-22 ENCOUNTER — Ambulatory Visit (INDEPENDENT_AMBULATORY_CARE_PROVIDER_SITE_OTHER): Payer: Medicare Other | Admitting: *Deleted

## 2011-07-22 ENCOUNTER — Other Ambulatory Visit: Payer: Self-pay | Admitting: Cardiology

## 2011-07-22 DIAGNOSIS — I4891 Unspecified atrial fibrillation: Secondary | ICD-10-CM

## 2011-07-22 DIAGNOSIS — Z7901 Long term (current) use of anticoagulants: Secondary | ICD-10-CM

## 2011-07-22 MED ORDER — LOSARTAN POTASSIUM 50 MG PO TABS: 50.0000 mg | ORAL_TABLET | Freq: Every day | ORAL | Status: DC

## 2011-07-24 ENCOUNTER — Other Ambulatory Visit: Payer: Self-pay | Admitting: Cardiology

## 2011-07-29 ENCOUNTER — Telehealth: Payer: Self-pay | Admitting: *Deleted

## 2011-07-29 MED ORDER — FLUTICASONE PROPIONATE 50 MCG/ACT NA SUSP: 2.0000 | Freq: Every day | NASAL | Status: DC

## 2011-07-29 NOTE — Telephone Encounter (Signed)
Pt left message on voicemail asking for a refill for her Flonase to be called into her pharmacy. She states in the message that Dr. Antoine Poche told her he would refill her medications until she could obtain a primary MD.   Pt states she has not been able to set up with primary MD yet. She is having trouble finding someone who will take her insurance. She is going to try to get in with Dr. Sherryll Burger next week.

## 2011-08-12 ENCOUNTER — Ambulatory Visit (INDEPENDENT_AMBULATORY_CARE_PROVIDER_SITE_OTHER): Payer: Medicare Other | Admitting: *Deleted

## 2011-08-12 DIAGNOSIS — Z7901 Long term (current) use of anticoagulants: Secondary | ICD-10-CM

## 2011-08-12 DIAGNOSIS — I4891 Unspecified atrial fibrillation: Secondary | ICD-10-CM

## 2011-09-09 ENCOUNTER — Ambulatory Visit (INDEPENDENT_AMBULATORY_CARE_PROVIDER_SITE_OTHER): Payer: Medicare Other | Admitting: *Deleted

## 2011-09-09 DIAGNOSIS — I4891 Unspecified atrial fibrillation: Secondary | ICD-10-CM

## 2011-09-09 DIAGNOSIS — Z7901 Long term (current) use of anticoagulants: Secondary | ICD-10-CM

## 2011-09-09 LAB — POCT INR: INR: 1.9

## 2011-09-17 ENCOUNTER — Ambulatory Visit (INDEPENDENT_AMBULATORY_CARE_PROVIDER_SITE_OTHER): Payer: Medicare Other | Admitting: Cardiology

## 2011-09-17 ENCOUNTER — Encounter: Payer: Self-pay | Admitting: Cardiology

## 2011-09-17 DIAGNOSIS — Z7901 Long term (current) use of anticoagulants: Secondary | ICD-10-CM

## 2011-09-17 DIAGNOSIS — I079 Rheumatic tricuspid valve disease, unspecified: Secondary | ICD-10-CM

## 2011-09-17 DIAGNOSIS — I071 Rheumatic tricuspid insufficiency: Secondary | ICD-10-CM

## 2011-09-17 DIAGNOSIS — I4891 Unspecified atrial fibrillation: Secondary | ICD-10-CM

## 2011-09-17 DIAGNOSIS — I509 Heart failure, unspecified: Secondary | ICD-10-CM

## 2011-09-17 DIAGNOSIS — I10 Essential (primary) hypertension: Secondary | ICD-10-CM

## 2011-09-17 MED ORDER — TRAMADOL HCL 50 MG PO TABS: 50.0000 mg | ORAL_TABLET | Freq: Four times a day (QID) | ORAL | Status: AC | PRN

## 2011-09-17 MED ORDER — LOSARTAN POTASSIUM 25 MG PO TABS: 25.0000 mg | ORAL_TABLET | Freq: Every day | ORAL | Status: DC

## 2011-09-17 NOTE — Assessment & Plan Note (Signed)
She has significant joint pain.  I have asked her to avoid Aleve.  She gets no improvement with Tylenol. I will cautiously prescribe Ultram as her joint pain is very limiting.

## 2011-09-17 NOTE — Assessment & Plan Note (Signed)
This is being treated in the context of treating the cardiomyopathy.

## 2011-09-17 NOTE — Progress Notes (Signed)
HPI This is a 77 year old female with newly diagnosed Afib on 01/17/11 while being evaluated for increased SOB at [facility].  The pt was placed on coumadin and metoprolol for rate control.  A 2D echo showed an ejection fraction of 30-35% as well as Mod MR/TR.  Further evaluation for etiology of cardiomyopathy with cardiac catherization to exclude ischemic cause were discussed with the patient and she did not wish to pursue catherization during that time.    At the last visit I increased her beta blocker. She did well with this. She does not notice any palpitations, presyncope or syncope. She does not have any chest pressure, neck or arm discomfort. She has no shortness of breath, PND or orthopnea. She's had no weight gain or edema. She tolerates her blood thinner. She is trying to do some walking. She is limited by joint pains. She recently bought a rolling walker to use.   Allergies  Allergen Reactions  . Garlic Rash  . Penicillins     Swelling     Current Outpatient Prescriptions  Medication Sig Dispense Refill  . acetaminophen (TYLENOL) 500 MG tablet Take 500 mg by mouth every 6 (six) hours as needed.        . carvedilol (COREG) 12.5 MG tablet Take 1 tablet (12.5 mg total) by mouth 2 (two) times daily.  60 tablet  6  . fluticasone (FLONASE) 50 MCG/ACT nasal spray Place 2 sprays into the nose daily.  16 g  1  . losartan (COZAAR) 50 MG tablet Take 1 tablet (50 mg total) by mouth daily.  30 tablet  11  . polyethylene glycol (MIRALAX / GLYCOLAX) packet Take 17 g by mouth daily.        Marland Kitchen senna (SENOKOT) 8.6 MG tablet Take 1 tablet by mouth daily as needed.       . warfarin (COUMADIN) 5 MG tablet Take 1 tablet (5 mg total) by mouth daily.  60 tablet  3    Past Medical History  Diagnosis Date  . CHF (congestive heart failure)     EF  30%  Echo 01/17/11  . Hypertension   . Atrial fibrillation   . Hernia   . Hyposmolality and/or hyponatremia   . Esophageal reflux   . Chronic  airway obstruction, not elsewhere classified   . Morbid obesity   . Osteoarthrosis, unspecified whether generalized or localized, unspecified site   . Mitral regurgitation   . Moderate tricuspid regurgitation by prior echocardiogram     Past Surgical History  Procedure Date  . Abdominal hysterectomy   . Right lower extremity surgery    ROS:  As stated in the HPI and negative for all other systems  PHYSICAL EXAM BP 152/84  Pulse 66  Ht 5\' 7"  (1.702 m)  Wt 207 lb (93.895 kg)  BMI 32.42 kg/m2 GENERAL:  Well appearing HEENT:  Pupils equal round and reactive, fundi not visualized, oral mucosa unremarkable NECK:  No jugular venous distention, waveform within normal limits, carotid upstroke brisk and symmetric, no bruits, no thyromegaly LYMPHATICS:  No cervical, inguinal adenopathy LUNGS:  Clear to auscultation bilaterally BACK:  No CVA tenderness CHEST:  Unremarkable HEART:  PMI not displaced or sustained,S1 and S2 within normal limits, no S3, no S4, no clicks, no rubs, 2/6 murmur apical irregular ABD:  Flat, positive bowel sounds normal in frequency in pitch, no bruits, no rebound, no guarding, no midline pulsatile mass, no hepatomegaly, no splenomegaly, obese EXT:  2 plus pulses  throughout, no edema, no cyanosis no clubbing SKIN:  No rashes no nodules NEURO:  Cranial nerves II through XII grossly intact, motor grossly intact throughout PSYCH:  Cognitively intact, oriented to person place and time  ASSESSMENT AND PLAN

## 2011-09-17 NOTE — Patient Instructions (Signed)
Please increase your Cozaar (losartan) 25 mg at bedtime Continue all other medications as listed  Follow up in 2 months with Dr Antoine Poche

## 2011-09-17 NOTE — Assessment & Plan Note (Addendum)
The patient  tolerates this rhythm and rate control and anticoagulation. We will continue with the meds as listed.  

## 2011-09-17 NOTE — Assessment & Plan Note (Signed)
We will again treat this conservatively with meds as above.

## 2011-09-17 NOTE — Assessment & Plan Note (Signed)
She wants conservative therapy.  I will increase the Cozaar by adding 25 mg at night to her current.

## 2011-09-23 ENCOUNTER — Telehealth: Payer: Self-pay | Admitting: Cardiology

## 2011-09-23 NOTE — Telephone Encounter (Signed)
Pt to take losartan 50 mg in the am and 25 mg in the pm.  Pharmacy aware.

## 2011-09-23 NOTE — Telephone Encounter (Signed)
New problem [facility] wanted to verify the dosage of losartan Please call

## 2011-09-30 ENCOUNTER — Ambulatory Visit (INDEPENDENT_AMBULATORY_CARE_PROVIDER_SITE_OTHER): Payer: Medicare Other | Admitting: *Deleted

## 2011-09-30 DIAGNOSIS — I4891 Unspecified atrial fibrillation: Secondary | ICD-10-CM

## 2011-09-30 DIAGNOSIS — Z7901 Long term (current) use of anticoagulants: Secondary | ICD-10-CM

## 2011-09-30 LAB — POCT INR: INR: 2.2

## 2011-11-04 ENCOUNTER — Encounter: Payer: Medicare Other | Admitting: *Deleted

## 2011-11-11 ENCOUNTER — Ambulatory Visit (INDEPENDENT_AMBULATORY_CARE_PROVIDER_SITE_OTHER): Payer: Medicare Other | Admitting: *Deleted

## 2011-11-11 ENCOUNTER — Other Ambulatory Visit: Payer: Self-pay | Admitting: *Deleted

## 2011-11-11 DIAGNOSIS — I4891 Unspecified atrial fibrillation: Secondary | ICD-10-CM

## 2011-11-11 DIAGNOSIS — Z7901 Long term (current) use of anticoagulants: Secondary | ICD-10-CM

## 2011-11-11 MED ORDER — FLUTICASONE PROPIONATE 50 MCG/ACT NA SUSP: 2.0000 | Freq: Every day | NASAL | Status: DC

## 2011-11-12 ENCOUNTER — Ambulatory Visit (INDEPENDENT_AMBULATORY_CARE_PROVIDER_SITE_OTHER): Payer: Medicare Other | Admitting: Cardiology

## 2011-11-12 ENCOUNTER — Encounter: Payer: Self-pay | Admitting: Cardiology

## 2011-11-12 DIAGNOSIS — I509 Heart failure, unspecified: Secondary | ICD-10-CM

## 2011-11-12 DIAGNOSIS — I10 Essential (primary) hypertension: Secondary | ICD-10-CM | POA: Diagnosis not present

## 2011-11-12 DIAGNOSIS — I4891 Unspecified atrial fibrillation: Secondary | ICD-10-CM | POA: Diagnosis not present

## 2011-11-12 MED ORDER — LOSARTAN POTASSIUM 50 MG PO TABS: 50.0000 mg | ORAL_TABLET | Freq: Two times a day (BID) | ORAL | Status: DC

## 2011-11-12 MED ORDER — FLUTICASONE PROPIONATE 50 MCG/ACT NA SUSP: 2.0000 | Freq: Every day | NASAL | Status: DC

## 2011-11-12 NOTE — Assessment & Plan Note (Signed)
Today I will increase her Cozaar to 50 mg twice a day. She will get a basic metabolic profile when she comes back for her next Coumadin check. I will continue to titrate meds and I will check an echocardiogram in the spring which will be one year from the date of the previous.

## 2011-11-12 NOTE — Progress Notes (Signed)
HPI The patient presents for follow up of her atrial fibrillation and cardiomyopathy.  At the last visit I increased her Cozaar slightly. Since that time she has had no new symptoms. She tolerated this increased dose without presyncope or syncope. She has no new shortness of breath, PND or orthopnea. She has had no new palpitations, presyncope or syncope. She denies any chest pressure, neck or arm discomfort. She's had no weight gain or edema. She gets around in her apartment with a walker.  Allergies  Allergen Reactions  . Garlic Rash  . Penicillins     Swelling     Current Outpatient Prescriptions  Medication Sig Dispense Refill  . acetaminophen (TYLENOL) 500 MG tablet Take 500 mg by mouth every 6 (six) hours as needed.        . carvedilol (COREG) 12.5 MG tablet Take 1 tablet (12.5 mg total) by mouth 2 (two) times daily.  60 tablet  6  . fluticasone (FLONASE) 50 MCG/ACT nasal spray Place 2 sprays into the nose daily.  16 g  1  . losartan (COZAAR) 25 MG tablet Take 1 tablet (25 mg total) by mouth at bedtime.  30 tablet  11  . losartan (COZAAR) 50 MG tablet Take 1 tablet (50 mg total) by mouth daily.  30 tablet  11  . polyethylene glycol (MIRALAX / GLYCOLAX) packet Take 17 g by mouth daily.        Marland Kitchen senna (SENOKOT) 8.6 MG tablet Take 1 tablet by mouth daily as needed.       . traMADol (ULTRAM) 50 MG tablet Take 1 tablet (50 mg total) by mouth every 6 (six) hours as needed for pain. Maximum dose= 8 tablets per day  30 tablet  0  . warfarin (COUMADIN) 5 MG tablet Take 1 tablet (5 mg total) by mouth daily.  60 tablet  3    Past Medical History  Diagnosis Date  . CHF (congestive heart failure)     EF  30%  Echo 01/17/11  . Hypertension   . Atrial fibrillation   . Hernia   . Hyposmolality and/or hyponatremia   . Esophageal reflux   . Chronic airway obstruction, not elsewhere classified   . Morbid obesity   . Osteoarthrosis, unspecified whether generalized or localized, unspecified  site   . Mitral regurgitation   . Moderate tricuspid regurgitation by prior echocardiogram     Past Surgical History  Procedure Date  . Abdominal hysterectomy   . Right lower extremity surgery    ROS:  As stated in the HPI and negative for all other systems  PHYSICAL EXAM BP 140/94  Pulse 98  Resp 18  Ht 5\' 7"  (1.702 m)  Wt 209 lb (94.802 kg)  BMI 32.73 kg/m2 GEN:  No distress NECK:  No jugular venous distention at 90 degrees, waveform within normal limits, carotid upstroke brisk and symmetric, no bruits, no thyromegaly LYMPHATICS:  No cervical adenopathy LUNGS:  Clear to auscultation bilaterally BACK:  No CVA tenderness CHEST:  Unremarkable HEART:  S1 and S2 within normal limits, no S3, no clicks, no rubs, no murmurs ABD:  Positive bowel sounds normal in frequency in pitch, no bruits, no rebound, no guarding, unable to assess midline mass or bruit with the patient seated. EXT:  2 plus pulses throughout, moderate edema, no cyanosis no clubbing SKIN:  No rashes no nodules NEURO:  Cranial nerves II through XII grossly intact, motor grossly intact throughout PSYCH:  Cognitively intact, oriented to person place  and time   ASSESSMENT AND PLAN

## 2011-11-12 NOTE — Assessment & Plan Note (Signed)
The patient  tolerates this rhythm and rate control and anticoagulation. We will continue with the meds as listed.  

## 2011-11-12 NOTE — Assessment & Plan Note (Signed)
This is being managed in the context of treating his CHF  

## 2011-11-12 NOTE — Patient Instructions (Addendum)
Please increase your Cozaar (losartan to 50 mg twice a day)  Continue all other medications as listed.  Please have blood work in 6 weeks (BMP)  Follow up in 2 months with Dr Antoine Poche

## 2011-12-30 ENCOUNTER — Ambulatory Visit (INDEPENDENT_AMBULATORY_CARE_PROVIDER_SITE_OTHER): Payer: Medicare Other | Admitting: *Deleted

## 2011-12-30 DIAGNOSIS — Z7901 Long term (current) use of anticoagulants: Secondary | ICD-10-CM | POA: Diagnosis not present

## 2011-12-30 DIAGNOSIS — I4891 Unspecified atrial fibrillation: Secondary | ICD-10-CM

## 2011-12-30 LAB — POCT INR: INR: 2.9

## 2011-12-31 ENCOUNTER — Other Ambulatory Visit: Payer: Self-pay | Admitting: Cardiology

## 2012-01-14 ENCOUNTER — Ambulatory Visit (INDEPENDENT_AMBULATORY_CARE_PROVIDER_SITE_OTHER): Payer: Medicare Other | Admitting: Cardiology

## 2012-01-14 ENCOUNTER — Encounter: Payer: Self-pay | Admitting: Cardiology

## 2012-01-14 ENCOUNTER — Encounter (INDEPENDENT_AMBULATORY_CARE_PROVIDER_SITE_OTHER): Payer: Medicare Other | Admitting: *Deleted

## 2012-01-14 ENCOUNTER — Ambulatory Visit: Payer: Medicare Other | Admitting: Cardiology

## 2012-01-14 VITALS — BP 110/80 | HR 120 | Ht 67.0 in | Wt 206.0 lb

## 2012-01-14 DIAGNOSIS — I509 Heart failure, unspecified: Secondary | ICD-10-CM

## 2012-01-14 DIAGNOSIS — R000 Tachycardia, unspecified: Secondary | ICD-10-CM

## 2012-01-14 DIAGNOSIS — I4891 Unspecified atrial fibrillation: Secondary | ICD-10-CM

## 2012-01-14 MED ORDER — DIGOXIN 250 MCG PO TABS: 250.0000 ug | ORAL_TABLET | Freq: Every day | ORAL | Status: DC

## 2012-01-14 NOTE — Progress Notes (Signed)
   HPI The patient presents for follow up of her atrial fibrillation and cardiomyopathy.  At the last visit I increased her Cozaar.  She had been a little with this but this has resolved. She's not describing any presyncope or syncope. She thinks her breathing is fine. She denies any PND or orthopnea. She has been battling some sinus. He is not describing any chest pressure, neck or arm discomfort. She has had no palpitations. She has had no weight gain or edema.  Allergies  Allergen Reactions  . Garlic Rash  . Penicillins     Swelling     Current Outpatient Prescriptions  Medication Sig Dispense Refill  . acetaminophen (TYLENOL) 500 MG tablet Take 500 mg by mouth every 6 (six) hours as needed.        . carvedilol (COREG) 12.5 MG tablet Take 1 tablet (12.5 mg total) by mouth 2 (two) times daily.  60 tablet  6  . fluticasone (FLONASE) 50 MCG/ACT nasal spray Place 2 sprays into the nose daily.  16 g  1  . losartan (COZAAR) 50 MG tablet Take 1 tablet (50 mg total) by mouth 2 (two) times daily.  60 tablet  11  . polyethylene glycol (MIRALAX / GLYCOLAX) packet Take 17 g by mouth daily.        Marland Kitchen senna (SENOKOT) 8.6 MG tablet Take 1 tablet by mouth daily as needed.       . traMADol (ULTRAM) 50 MG tablet Take 1 tablet (50 mg total) by mouth every 6 (six) hours as needed for pain. Maximum dose= 8 tablets per day  30 tablet  0  . warfarin (COUMADIN) 5 MG tablet TAKE 1 TABLET AS DIRECTED  45 tablet  3    Past Medical History  Diagnosis Date  . CHF (congestive heart failure)     EF  30%  Echo 01/17/11  . Hypertension   . Atrial fibrillation   . Hernia   . Hyposmolality and/or hyponatremia   . Esophageal reflux   . Chronic airway obstruction, not elsewhere classified   . Morbid obesity   . Osteoarthrosis, unspecified whether generalized or localized, unspecified site   . Mitral regurgitation   . Moderate tricuspid regurgitation by prior echocardiogram     Past Surgical History  Procedure  Date  . Abdominal hysterectomy   . Right lower extremity surgery    ROS:  Sinus congestion. As stated in the HPI and negative for all other systems  PHYSICAL EXAM BP 110/80  Pulse 120  Ht 5\' 7"  (1.702 m)  Wt 206 lb (93.441 kg)  BMI 32.26 kg/m2 GEN: No distress  NECK: No jugular venous distention at 90 degrees, waveform within normal limits, carotid upstroke brisk and symmetric, no bruits, no thyromegaly  LYMPHATICS: No cervical adenopathy  LUNGS: Clear to auscultation bilaterally  BACK: No CVA tenderness  CHEST: Unremarkable  HEART: S1 and S2 within normal limits, no S3, no clicks, no rubs, no murmurs  ABD: Positive bowel sounds normal in frequency in pitch, no bruits, no rebound, no guarding, unable to assess midline mass or bruit with the patient seated.  EXT: 2 plus pulses throughout, moderate edema, no cyanosis no clubbing  SKIN: No rashes no nodules  NEURO: Cranial nerves II through XII grossly intact, motor grossly intact throughout  PSYCH: Cognitively intact, oriented to person place and time  EKG:   Tachycardia rate 120, right bundle branch block (old). No acute ST-T wave changes. 01/14/2012  ASSESSMENT AND PLAN

## 2012-01-14 NOTE — Progress Notes (Unsigned)
24 hour holter monitor placed with instructions. Patient advised to bring monitor back on tomorrow for downloading. Patient verbalized understanding of plan.

## 2012-01-14 NOTE — Assessment & Plan Note (Signed)
I will order the Holter as above.  She was in sinus rhythm with a very long first degree heart block at the last visit. She will continue her warfarin.

## 2012-01-14 NOTE — Assessment & Plan Note (Signed)
I'm not going to titrate her medications further at this point that she was slightly lightheaded with the increased dose of Cozaar. She will continue the meds as listed.

## 2012-01-14 NOTE — Assessment & Plan Note (Signed)
This might be a sinus tachycardia. I am going to try to get a Holter monitor placed today for 24 hours. I'm going to start digoxin 0.25 mg daily. Like to see her back early next week to further evaluate. Otherwise she will continue the medications as listed.

## 2012-01-14 NOTE — Patient Instructions (Signed)
Please start Digoxin .25 mg a day Continue all other medications as listed.  Your physician has recommended that you wear a holter monitor for 24 hours. Holter monitors are medical devices that record the heart's electrical activity. Doctors most often use these monitors to diagnose arrhythmias. Arrhythmias are problems with the speed or rhythm of the heartbeat. The monitor is a small, portable device. You can wear one while you do your normal daily activities. This is usually used to diagnose what is causing palpitations/syncope (passing out).  Follow up with Dr Antoine Poche after testing.

## 2012-01-26 ENCOUNTER — Telehealth: Payer: Self-pay | Admitting: *Deleted

## 2012-01-26 DIAGNOSIS — I4892 Unspecified atrial flutter: Secondary | ICD-10-CM

## 2012-01-26 MED ORDER — LOSARTAN POTASSIUM 50 MG PO TABS: 50.0000 mg | ORAL_TABLET | Freq: Every day | ORAL | Status: DC

## 2012-01-26 MED ORDER — CARVEDILOL 12.5 MG PO TABS: 18.7500 mg | ORAL_TABLET | Freq: Two times a day (BID) | ORAL | Status: DC

## 2012-01-26 NOTE — Telephone Encounter (Signed)
Med changes per Dr. Jenene Slicker review of Holter: decrease Cozaar to 50 mg daily and increase Coreg to 18.75 mg two times a day. (See scanned holter.)   Pt scheduled to see Dr. Graciela Husbands tomorrow (4/9) in [facility] office.  Spoke w/pt on mobile number. (Home number in Epic is office to pt's apartments.) Pt states she has to arrange transportation and cannot go to [employer]. Pt notified of med changes. She asked if Dr. Antoine Poche refilled cream that she d/w him at recent office visit. Pt notified by RN that there is no cream listed on her medication list. Dr. Antoine Poche states he will see pt in office tomorrow to discuss further. Pt scheduled for OV on 01/27/12 w/Dr. Antoine Poche at 9:15. She will try to arrange transportation for this appointment.

## 2012-01-26 NOTE — Telephone Encounter (Signed)
Message copied by Arlyss Gandy on Mon Jan 26, 2012  3:59 PM ------      Message from: Rollene Rotunda      Created: Mon Jan 26, 2012  1:42 PM       Atrial flutter with variable conduction.  I would like for her to see EP as soon as possible for consideration of ablation.

## 2012-01-27 ENCOUNTER — Ambulatory Visit (INDEPENDENT_AMBULATORY_CARE_PROVIDER_SITE_OTHER): Payer: Medicare Other | Admitting: Cardiology

## 2012-01-27 ENCOUNTER — Encounter: Payer: Self-pay | Admitting: Cardiology

## 2012-01-27 ENCOUNTER — Institutional Professional Consult (permissible substitution): Payer: Medicare Other | Admitting: Internal Medicine

## 2012-01-27 VITALS — BP 150/81 | HR 70 | Ht 65.0 in | Wt 207.0 lb

## 2012-01-27 DIAGNOSIS — I509 Heart failure, unspecified: Secondary | ICD-10-CM | POA: Diagnosis not present

## 2012-01-27 DIAGNOSIS — I4891 Unspecified atrial fibrillation: Secondary | ICD-10-CM

## 2012-01-27 DIAGNOSIS — I10 Essential (primary) hypertension: Secondary | ICD-10-CM | POA: Diagnosis not present

## 2012-01-27 MED ORDER — TRIAMCINOLONE ACETONIDE 0.1 % EX CREA: TOPICAL_CREAM | Freq: Every day | CUTANEOUS | Status: AC

## 2012-01-27 MED ORDER — FLUTICASONE PROPIONATE 50 MCG/ACT NA SUSP: 2.0000 | Freq: Every day | NASAL | Status: DC

## 2012-01-27 NOTE — Progress Notes (Signed)
HPI The patient presents for follow up of her atrial fibrillation and cardiomyopathy. At the last visit she was noted to be tachycardic. A Holter monitor demonstrated atrial flutter with variable conduction. The predominant ventricular rate was about 120.  She was started on digoxin at that visit. After reviewing the Holter results I increased her beta blocker reducing her ARB to avoid hypotension. This is listed below in her meds though she has not yet started this change. I was trying to refer her for an electrophysiology evaluation. However she does not want to do this at this time. She denies any palpitations, presyncope or syncope. She denies any chest pressure, neck or arm discomfort. She gets around slowly because of arthritis.  Allergies  Allergen Reactions  . Garlic Rash  . Penicillins     Swelling     Current Outpatient Prescriptions  Medication Sig Dispense Refill  . acetaminophen (TYLENOL) 500 MG tablet Take 500 mg by mouth every 6 (six) hours as needed.        . carvedilol (COREG) 12.5 MG tablet Take 1.5 tablets (18.75 mg total) by mouth 2 (two) times daily.  90 tablet  6  . digoxin (LANOXIN) 0.25 MG tablet Take 1 tablet (250 mcg total) by mouth daily.  30 tablet  11  . fluticasone (FLONASE) 50 MCG/ACT nasal spray Place 2 sprays into the nose daily.  16 g  1  . losartan (COZAAR) 50 MG tablet Take 1 tablet (50 mg total) by mouth daily.  60 tablet  6  . NON FORMULARY Apply 1 application topically at bedtime. Triamcinolone 0.1%/Aquaphor1:1 total      . polyethylene glycol (MIRALAX / GLYCOLAX) packet Take 17 g by mouth daily.        Marland Kitchen senna (SENOKOT) 8.6 MG tablet Take 1 tablet by mouth daily as needed.       . traMADol (ULTRAM) 50 MG tablet Take 1 tablet (50 mg total) by mouth every 6 (six) hours as needed for pain. Maximum dose= 8 tablets per day  30 tablet  0  . warfarin (COUMADIN) 5 MG tablet TAKE 1 TABLET AS DIRECTED  45 tablet  3    Past Medical History  Diagnosis Date    . CHF (congestive heart failure)     EF  30%  Echo 01/17/11  . Hypertension   . Atrial fibrillation   . Hernia   . Hyposmolality and/or hyponatremia   . Esophageal reflux   . Chronic airway obstruction, not elsewhere classified   . Morbid obesity   . Osteoarthrosis, unspecified whether generalized or localized, unspecified site   . Mitral regurgitation   . Moderate tricuspid regurgitation by prior echocardiogram     Past Surgical History  Procedure Date  . Abdominal hysterectomy   . Right lower extremity surgery    ROS:  Sinus congestion. As stated in the HPI and negative for all other systems  PHYSICAL EXAM BP 150/81  Pulse 70  Ht 5\' 5"  (1.651 m)  Wt 207 lb (93.895 kg)  BMI 34.45 kg/m2  SpO2 98% GEN: No distress  NECK: No jugular venous distention at 90 degrees, waveform within normal limits, carotid upstroke brisk and symmetric, no bruits, no thyromegaly  LYMPHATICS: No cervical adenopathy  LUNGS: Clear to auscultation bilaterally  BACK: No CVA tenderness  CHEST: Unremarkable  HEART: S1 and S2 within normal limits, no S3, no clicks, no rubs, no murmurs, irregular ABD: Positive bowel sounds normal in frequency in pitch, no bruits, no rebound,  no guarding, unable to assess midline mass or bruit with the patient seated.  EXT: 2 plus pulses throughout, moderate edema, no cyanosis no clubbing  SKIN: No rashes no nodules  NEURO: Cranial nerves II through XII grossly intact, motor grossly intact throughout  PSYCH: Cognitively intact, oriented to person place and time  EKG:   . 01/27/2012  atrial fibrillation, rate 74, right bundle branch block, digoxin changes.  ASSESSMENT AND PLAN

## 2012-01-27 NOTE — Assessment & Plan Note (Signed)
The blood pressure is at target. No change in medications is indicated. We will continue with therapeutic lifestyle changes (TLC).  

## 2012-01-27 NOTE — Assessment & Plan Note (Signed)
She seems to be euvolemic.  At this point, no change in therapy is indicated.  We have reviewed salt and fluid restrictions.  No further cardiovascular testing is indicated.   

## 2012-01-27 NOTE — Assessment & Plan Note (Signed)
The patient is now back in atrial fibrillation. He does not want to consider ablation of her flutter at this point. I will proceed with increasing her beta blocker. She will continue with the current dose of digoxin. I will reassess with Holter monitors in the future. She'll let me know if she has any palpitations, presyncope or syncope. She will continue with anticoagulation.

## 2012-01-27 NOTE — Patient Instructions (Addendum)
Follow up in 1 month. Decrease Cozaar (losartan) to 50 mg daily. Increase Coreg (carvedilol) to 18.75 mg two times a day. This will be  _1 1/2_ of your _12.5_ mg tablets two times a day.

## 2012-02-10 ENCOUNTER — Ambulatory Visit (INDEPENDENT_AMBULATORY_CARE_PROVIDER_SITE_OTHER): Payer: Medicare Other | Admitting: *Deleted

## 2012-02-10 DIAGNOSIS — Z7901 Long term (current) use of anticoagulants: Secondary | ICD-10-CM

## 2012-02-10 DIAGNOSIS — I4891 Unspecified atrial fibrillation: Secondary | ICD-10-CM | POA: Diagnosis not present

## 2012-02-10 LAB — POCT INR: INR: 3.9

## 2012-03-23 ENCOUNTER — Encounter: Payer: Self-pay | Admitting: Cardiology

## 2012-03-23 ENCOUNTER — Ambulatory Visit (INDEPENDENT_AMBULATORY_CARE_PROVIDER_SITE_OTHER): Payer: Medicare Other | Admitting: *Deleted

## 2012-03-23 ENCOUNTER — Ambulatory Visit (INDEPENDENT_AMBULATORY_CARE_PROVIDER_SITE_OTHER): Payer: Medicare Other | Admitting: Cardiology

## 2012-03-23 VITALS — BP 162/85 | HR 64 | Resp 16 | Ht 67.0 in | Wt 206.0 lb

## 2012-03-23 DIAGNOSIS — I4891 Unspecified atrial fibrillation: Secondary | ICD-10-CM

## 2012-03-23 DIAGNOSIS — Z7901 Long term (current) use of anticoagulants: Secondary | ICD-10-CM | POA: Diagnosis not present

## 2012-03-23 DIAGNOSIS — I10 Essential (primary) hypertension: Secondary | ICD-10-CM

## 2012-03-23 DIAGNOSIS — I509 Heart failure, unspecified: Secondary | ICD-10-CM

## 2012-03-23 NOTE — Assessment & Plan Note (Signed)
Her blood pressure is elevated today. Given her instructions on keeping a blood pressure diary. I looked at previous recordings in her blood pressure fluctuates so I do not need to change based on today's reading.

## 2012-03-23 NOTE — Patient Instructions (Signed)
Your physician recommends that you schedule a follow-up appointment in: 6 months. You will receive a reminder letter in the mail in about 4 months reminding you to call and schedule your appointment. If you don't receive this letter, please contact our office.  Your physician recommends that you continue on your current medications as directed. Please refer to the Current Medication list given to you today.   Please keep a diary of your blood pressure readings.

## 2012-03-23 NOTE — Assessment & Plan Note (Signed)
She seems to be euvolemic.  At this point, no change in therapy is indicated.  We have reviewed salt and fluid restrictions.  No further cardiovascular testing is indicated.   

## 2012-03-23 NOTE — Progress Notes (Signed)
   HPI The patient presents for follow up of her atrial fibrillation and cardiomyopathy. Since I last saw her she moved into a new apartment. She's excited by this. She denies any new cardiovascular symptoms. In particular she denies any palpitations, presyncope or syncope. She denies any chest pressure, neck or arm discomfort. She is limited by musculoskeletal pains. She gets around however with her walker. She doesn't have any new shortness of breath, PND or orthopnea.  Allergies  Allergen Reactions  . Garlic Rash  . Penicillins     Swelling     Current Outpatient Prescriptions  Medication Sig Dispense Refill  . acetaminophen (TYLENOL) 500 MG tablet Take 500 mg by mouth every 6 (six) hours as needed.        . carvedilol (COREG) 12.5 MG tablet Take 1.5 tablets (18.75 mg total) by mouth 2 (two) times daily.  90 tablet  6  . digoxin (LANOXIN) 0.25 MG tablet Take 1 tablet (250 mcg total) by mouth daily.  30 tablet  11  . fluticasone (FLONASE) 50 MCG/ACT nasal spray Place 2 sprays into the nose daily.  16 g  2  . losartan (COZAAR) 50 MG tablet Take 1 tablet (50 mg total) by mouth daily.  60 tablet  6  . polyethylene glycol (MIRALAX / GLYCOLAX) packet Take 17 g by mouth daily.        Marland Kitchen senna (SENOKOT) 8.6 MG tablet Take 1 tablet by mouth daily as needed.       . traMADol (ULTRAM) 50 MG tablet Take 1 tablet (50 mg total) by mouth every 6 (six) hours as needed for pain. Maximum dose= 8 tablets per day  30 tablet  0  . triamcinolone cream (KENALOG) 0.1 % Apply topically at bedtime. Mix 1:1 with Aquaphor  454 g  2  . warfarin (COUMADIN) 5 MG tablet TAKE 1 TABLET AS DIRECTED  45 tablet  3    Past Medical History  Diagnosis Date  . CHF (congestive heart failure)     EF  30%  Echo 01/17/11  . Hypertension   . Atrial fibrillation   . Hernia   . Hyposmolality and/or hyponatremia   . Esophageal reflux   . Chronic airway obstruction, not elsewhere classified   . Morbid obesity   .  Osteoarthrosis, unspecified whether generalized or localized, unspecified site   . Mitral regurgitation   . Moderate tricuspid regurgitation by prior echocardiogram     Past Surgical History  Procedure Date  . Abdominal hysterectomy   . Right lower extremity surgery    ROS:  Sinus congestion. As stated in the HPI and negative for all other systems  PHYSICAL EXAM BP 162/85  Pulse 64  Resp 16  Ht 5\' 7"  (1.702 m)  Wt 206 lb (93.441 kg)  BMI 32.26 kg/m2 GEN: No distress  NECK: No jugular venous distention at 90 degrees, waveform within normal limits, carotid upstroke brisk and symmetric, no bruits, no thyromegaly  LYMPHATICS: No cervical adenopathy  LUNGS: Clear to auscultation bilaterally  BACK: No CVA tenderness  CHEST: Unremarkable  HEART: S1 and S2 within normal limits, no S3, no clicks, no rubs, no murmurs, irregular ABD: Positive bowel sounds normal in frequency in pitch, no bruits, no rebound, no guarding, unable to assess midline mass or bruit with the patient seated.  EXT: 2 plus pulses throughout, moderate edema, no cyanosis no clubbing   ASSESSMENT AND PLAN

## 2012-03-23 NOTE — Assessment & Plan Note (Signed)
At the last appt I adjusted her meds to further treat her ventricular rate.  She tolerated this well. She tolerates rate control anticoagulation. At this point I will make no change her medical regimen.

## 2012-03-24 ENCOUNTER — Other Ambulatory Visit: Payer: Self-pay | Admitting: *Deleted

## 2012-03-25 ENCOUNTER — Other Ambulatory Visit: Payer: Self-pay | Admitting: Cardiology

## 2012-04-06 ENCOUNTER — Ambulatory Visit (INDEPENDENT_AMBULATORY_CARE_PROVIDER_SITE_OTHER): Payer: Medicare Other | Admitting: *Deleted

## 2012-04-06 DIAGNOSIS — Z7901 Long term (current) use of anticoagulants: Secondary | ICD-10-CM

## 2012-04-06 DIAGNOSIS — I4891 Unspecified atrial fibrillation: Secondary | ICD-10-CM | POA: Diagnosis not present

## 2012-04-06 LAB — POCT INR: INR: 3.6

## 2012-04-27 ENCOUNTER — Ambulatory Visit (INDEPENDENT_AMBULATORY_CARE_PROVIDER_SITE_OTHER): Payer: Medicare Other | Admitting: *Deleted

## 2012-04-27 DIAGNOSIS — I4891 Unspecified atrial fibrillation: Secondary | ICD-10-CM | POA: Diagnosis not present

## 2012-04-27 DIAGNOSIS — Z7901 Long term (current) use of anticoagulants: Secondary | ICD-10-CM | POA: Diagnosis not present

## 2012-05-25 ENCOUNTER — Ambulatory Visit (INDEPENDENT_AMBULATORY_CARE_PROVIDER_SITE_OTHER): Payer: Medicare Other | Admitting: *Deleted

## 2012-05-25 DIAGNOSIS — I4891 Unspecified atrial fibrillation: Secondary | ICD-10-CM | POA: Diagnosis not present

## 2012-05-25 DIAGNOSIS — Z7901 Long term (current) use of anticoagulants: Secondary | ICD-10-CM | POA: Diagnosis not present

## 2012-05-25 LAB — POCT INR: INR: 2.1

## 2012-06-29 ENCOUNTER — Ambulatory Visit (INDEPENDENT_AMBULATORY_CARE_PROVIDER_SITE_OTHER): Payer: Medicare Other | Admitting: *Deleted

## 2012-06-29 DIAGNOSIS — Z7901 Long term (current) use of anticoagulants: Secondary | ICD-10-CM

## 2012-06-29 DIAGNOSIS — I4891 Unspecified atrial fibrillation: Secondary | ICD-10-CM | POA: Diagnosis not present

## 2012-07-05 ENCOUNTER — Other Ambulatory Visit: Payer: Self-pay | Admitting: Cardiology

## 2012-07-09 ENCOUNTER — Other Ambulatory Visit: Payer: Self-pay | Admitting: *Deleted

## 2012-07-09 MED ORDER — FLUTICASONE PROPIONATE 50 MCG/ACT NA SUSP: 2.0000 | Freq: Every day | NASAL | Status: DC

## 2012-08-03 ENCOUNTER — Ambulatory Visit (INDEPENDENT_AMBULATORY_CARE_PROVIDER_SITE_OTHER): Payer: Medicare Other | Admitting: *Deleted

## 2012-08-03 DIAGNOSIS — I4891 Unspecified atrial fibrillation: Secondary | ICD-10-CM | POA: Diagnosis not present

## 2012-08-03 DIAGNOSIS — Z7901 Long term (current) use of anticoagulants: Secondary | ICD-10-CM | POA: Diagnosis not present

## 2012-08-05 ENCOUNTER — Other Ambulatory Visit: Payer: Self-pay | Admitting: Cardiology

## 2012-08-05 NOTE — Telephone Encounter (Signed)
Please refill.

## 2012-09-04 ENCOUNTER — Other Ambulatory Visit: Payer: Self-pay | Admitting: Cardiology

## 2012-09-06 NOTE — Telephone Encounter (Signed)
..   Requested Prescriptions   Pending Prescriptions Disp Refills  . carvedilol (COREG) 12.5 MG tablet [Pharmacy Med Name: Carvedilol 12.5 Mg Tablet] 90 tablet 3    Sig: TAKE 1 AND 1/2 TABLETS TWICE DAILY

## 2012-10-01 ENCOUNTER — Ambulatory Visit (INDEPENDENT_AMBULATORY_CARE_PROVIDER_SITE_OTHER): Payer: Medicare Other | Admitting: *Deleted

## 2012-10-01 DIAGNOSIS — Z7901 Long term (current) use of anticoagulants: Secondary | ICD-10-CM | POA: Diagnosis not present

## 2012-10-01 DIAGNOSIS — I4891 Unspecified atrial fibrillation: Secondary | ICD-10-CM

## 2012-10-01 LAB — POCT INR: INR: 2.7

## 2012-10-18 ENCOUNTER — Ambulatory Visit (INDEPENDENT_AMBULATORY_CARE_PROVIDER_SITE_OTHER): Payer: Medicare Other | Admitting: Cardiology

## 2012-10-18 ENCOUNTER — Encounter: Payer: Self-pay | Admitting: Cardiology

## 2012-10-18 ENCOUNTER — Other Ambulatory Visit: Payer: Self-pay | Admitting: Cardiology

## 2012-10-18 VITALS — BP 140/70 | HR 50 | Ht 67.0 in | Wt 192.0 lb

## 2012-10-18 DIAGNOSIS — I079 Rheumatic tricuspid valve disease, unspecified: Secondary | ICD-10-CM

## 2012-10-18 DIAGNOSIS — I059 Rheumatic mitral valve disease, unspecified: Secondary | ICD-10-CM

## 2012-10-18 DIAGNOSIS — I509 Heart failure, unspecified: Secondary | ICD-10-CM | POA: Diagnosis not present

## 2012-10-18 DIAGNOSIS — I10 Essential (primary) hypertension: Secondary | ICD-10-CM | POA: Diagnosis not present

## 2012-10-18 DIAGNOSIS — I4891 Unspecified atrial fibrillation: Secondary | ICD-10-CM | POA: Diagnosis not present

## 2012-10-18 DIAGNOSIS — I340 Nonrheumatic mitral (valve) insufficiency: Secondary | ICD-10-CM

## 2012-10-18 DIAGNOSIS — I071 Rheumatic tricuspid insufficiency: Secondary | ICD-10-CM

## 2012-10-18 MED ORDER — SENNOSIDES 8.6 MG PO TABS: 1.0000 | ORAL_TABLET | Freq: Every day | ORAL | Status: DC | PRN

## 2012-10-18 MED ORDER — POLYETHYLENE GLYCOL 3350 17 GM/SCOOP PO POWD: 17.0000 g | Freq: Every day | ORAL | Status: DC

## 2012-10-18 NOTE — Patient Instructions (Signed)
Your physician recommends that you schedule a follow-up appointment in: 6 months with Dr. Antoine Poche  Continue your current medications.

## 2012-10-18 NOTE — Progress Notes (Signed)
HPI The patient presents for follow up of her atrial fibrillation and cardiomyopathy. Since I last saw her she is having some trouble with her new apartment which is disappointing as she was so excited about this. She denies any new cardiovascular symptoms however.  In particular she denies any palpitations, presyncope or syncope. She denies any chest pressure, neck or arm discomfort. She is limited by musculoskeletal pains. She gets around however with her walker. She doesn't have any new shortness of breath, PND or orthopnea.  She sleeps chronically on 2 pillows.  Allergies  Allergen Reactions  . Garlic Rash  . Penicillins     Swelling     Current Outpatient Prescriptions  Medication Sig Dispense Refill  . acetaminophen (TYLENOL) 500 MG tablet Take 500 mg by mouth every 6 (six) hours as needed.        . carvedilol (COREG) 12.5 MG tablet TAKE 1 AND 1/2 TABLETS TWICE DAILY  90 tablet  3  . digoxin (LANOXIN) 0.25 MG tablet Take 1 tablet (250 mcg total) by mouth daily.  30 tablet  11  . fluticasone (FLONASE) 50 MCG/ACT nasal spray Place 2 sprays into the nose daily.  16 g  2  . losartan (COZAAR) 50 MG tablet Take 1 tablet (50 mg total) by mouth daily.  60 tablet  6  . polyethylene glycol powder (GLYCOLAX/MIRALAX) powder TAKE 17 GRAMS(TO MARK ON CAP) IN 8 OUNCES OF WATER DAILY FOR CONSTIPATION. HOLD IF ANY DIARRHEA.  17 g  0  . senna (SENOKOT) 8.6 MG tablet Take 1 tablet by mouth daily as needed.       . triamcinolone cream (KENALOG) 0.1 % Apply topically at bedtime. Mix 1:1 with Aquaphor  454 g  2  . warfarin (COUMADIN) 5 MG tablet TAKE ONE TABLET AS DIRECTED  45 tablet  3    Past Medical History  Diagnosis Date  . CHF (congestive heart failure)     EF  30%  Echo 01/17/11  . Hypertension   . Atrial fibrillation   . Hernia   . Hyposmolality and/or hyponatremia   . Esophageal reflux   . Chronic airway obstruction, not elsewhere classified   . Morbid obesity   . Osteoarthrosis,  unspecified whether generalized or localized, unspecified site   . Mitral regurgitation   . Moderate tricuspid regurgitation by prior echocardiogram     Past Surgical History  Procedure Date  . Abdominal hysterectomy   . Right lower extremity surgery    ROS:   Insomnia. Otherwise as stated in the HPI and negative for all other systems  PHYSICAL EXAM BP 140/70  Pulse 50  Ht 5\' 7"  (1.702 m)  Wt 192 lb (87.091 kg)  BMI 30.07 kg/m2 GEN: No distress  NECK: No jugular venous distention at 90 degrees, waveform within normal limits, carotid upstroke brisk and symmetric, no bruits, no thyromegaly  LYMPHATICS: No cervical adenopathy  LUNGS: Clear to auscultation bilaterally  BACK: No CVA tenderness  CHEST: Unremarkable  HEART: S1 and S2 within normal limits, no S3, no clicks, no rubs, no murmurs, regular ABD: Positive bowel sounds normal in frequency in pitch, no bruits, no rebound, no guarding, unable to assess midline mass or bruit with the patient seated.  EXT: 2 plus pulses throughout, moderate edema, no cyanosis no clubbing   EKG:  Sinus bradycardia, rate 51, first degree AV block, right bundle branch block, inferolateral T-wave changes unchanged from previous.  ASSESSMENT AND PLAN  Atrial fibrillation -  She  currently is in sinus with a first-degree AV block. She doesn't notice whether she is in sinus of fibrillation. Though she does have a profound first degree AV block and a right bundle branch block there is no indication for pacing at this point. She can continue the meds as listed.  CHF (congestive heart failure) -  She seems to be euvolemic. At this point, no change in therapy is indicated. We have reviewed salt and fluid restrictions. No further cardiovascular testing is indicated.   Hypertension -  I reviewed her blood pressure diary today. Her blood pressures are typically well controlled though they fluctuate slightly. No change in therapy is indicated.

## 2012-10-26 ENCOUNTER — Other Ambulatory Visit: Payer: Self-pay | Admitting: Cardiology

## 2012-10-26 ENCOUNTER — Other Ambulatory Visit: Payer: Self-pay | Admitting: *Deleted

## 2012-10-26 MED ORDER — FLUTICASONE PROPIONATE 50 MCG/ACT NA SUSP: 2.0000 | Freq: Every day | NASAL | Status: DC

## 2012-11-12 ENCOUNTER — Ambulatory Visit (INDEPENDENT_AMBULATORY_CARE_PROVIDER_SITE_OTHER): Payer: Medicare Other | Admitting: *Deleted

## 2012-11-12 DIAGNOSIS — I4891 Unspecified atrial fibrillation: Secondary | ICD-10-CM

## 2012-11-12 DIAGNOSIS — Z7901 Long term (current) use of anticoagulants: Secondary | ICD-10-CM

## 2012-12-21 ENCOUNTER — Ambulatory Visit (INDEPENDENT_AMBULATORY_CARE_PROVIDER_SITE_OTHER): Payer: Medicare Other | Admitting: *Deleted

## 2012-12-21 DIAGNOSIS — Z7901 Long term (current) use of anticoagulants: Secondary | ICD-10-CM

## 2012-12-21 DIAGNOSIS — I4891 Unspecified atrial fibrillation: Secondary | ICD-10-CM | POA: Diagnosis not present

## 2013-01-04 ENCOUNTER — Other Ambulatory Visit: Payer: Self-pay | Admitting: Cardiology

## 2013-01-04 NOTE — Telephone Encounter (Signed)
..   Requested Prescriptions   Pending Prescriptions Disp Refills  . carvedilol (COREG) 12.5 MG tablet [Pharmacy Med Name: Carvedilol 12.5 Mg Tablet] 90 tablet 1    Sig: TAKE ONE & ONE-HALF (1 & 1/2) TABLETS TWICE DAILY  ..Patient needs to contact office to schedule  Appointment  for future refills.Ph:[phone]. Thank you.

## 2013-01-18 ENCOUNTER — Ambulatory Visit (INDEPENDENT_AMBULATORY_CARE_PROVIDER_SITE_OTHER): Payer: Medicare Other | Admitting: *Deleted

## 2013-01-18 DIAGNOSIS — I4891 Unspecified atrial fibrillation: Secondary | ICD-10-CM | POA: Diagnosis not present

## 2013-01-18 DIAGNOSIS — Z7901 Long term (current) use of anticoagulants: Secondary | ICD-10-CM

## 2013-01-21 IMAGING — CR DG ABDOMEN 2V
2 series · 2 of 2 positions shown · non-contrast
Comparison: None.

CLINICAL DATA: abdominal pain.  Shortness of breath.

ABDOMEN - 2 VIEW

[w abdomen upright]
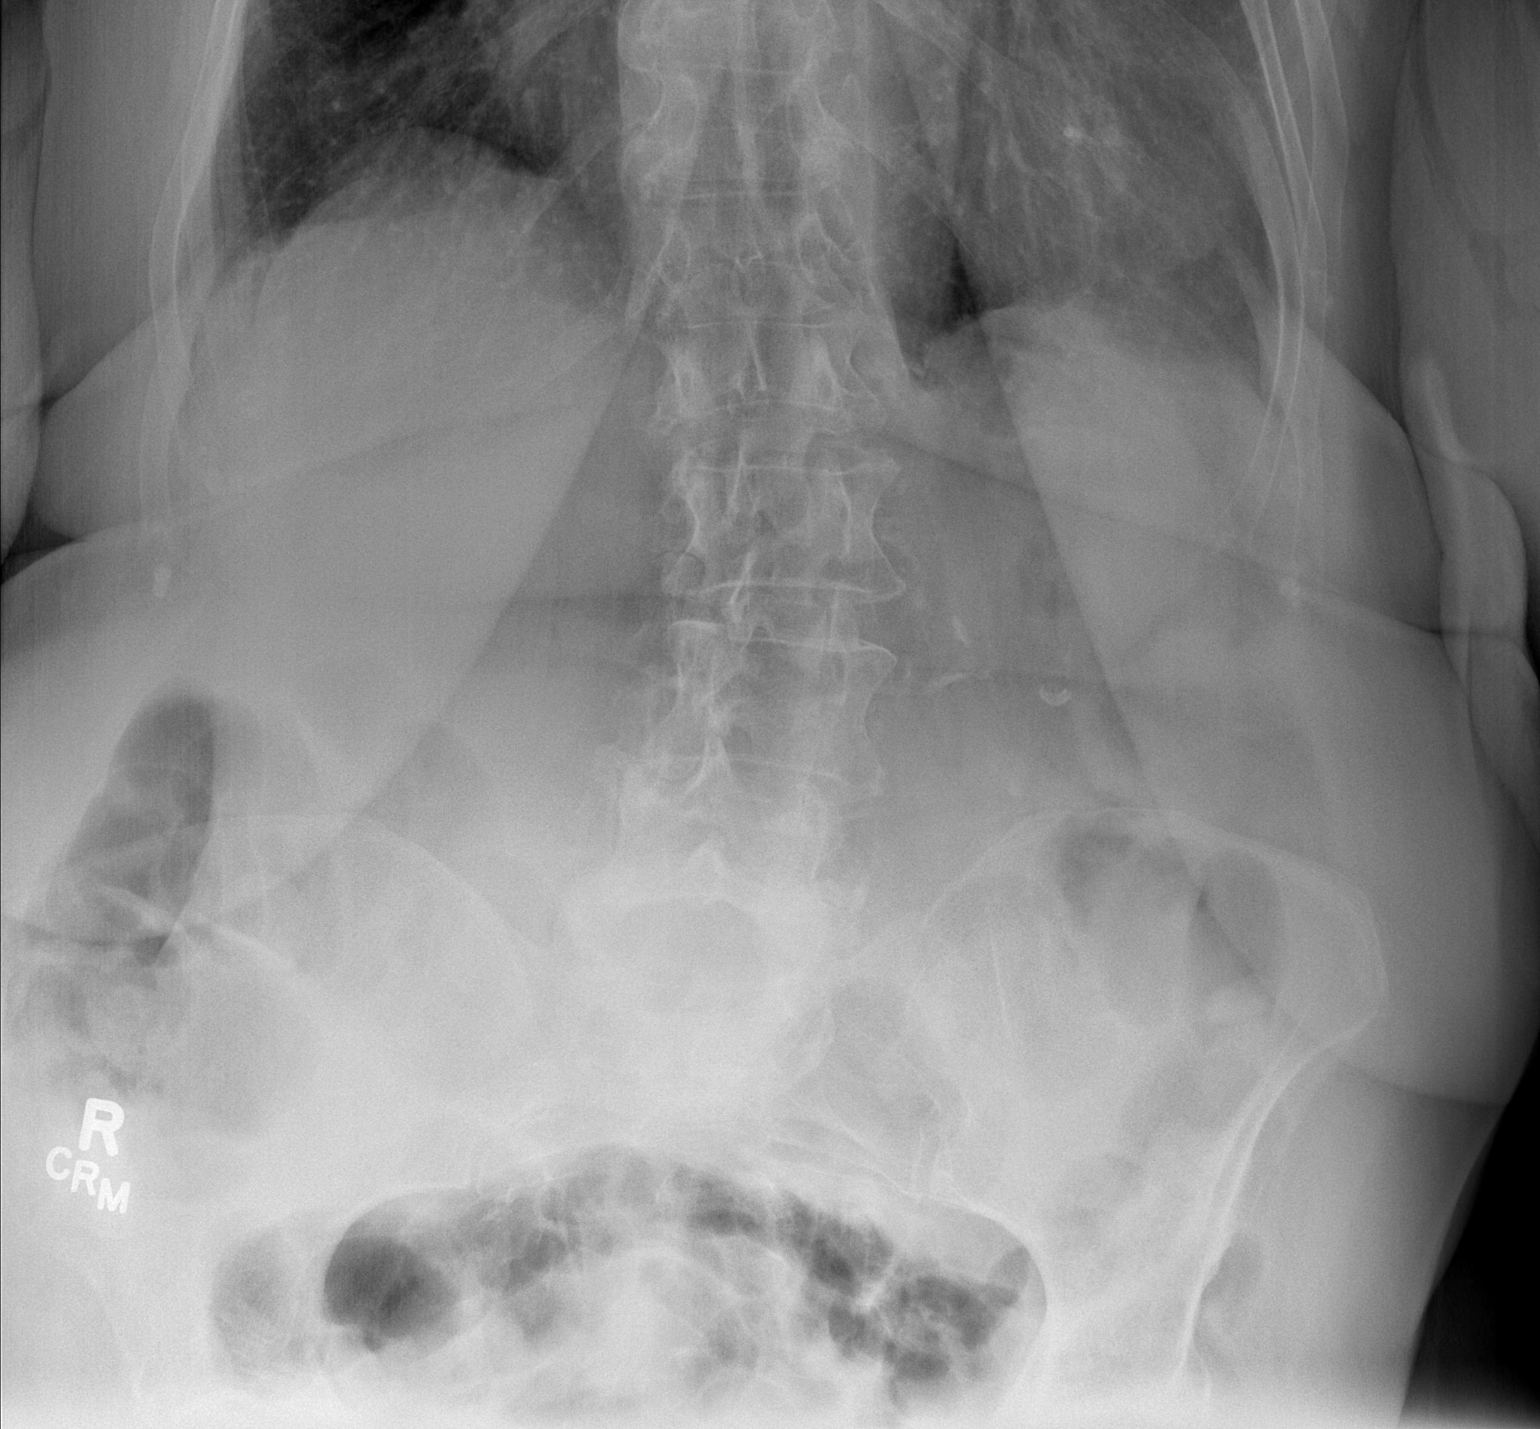

[t abdomen supine]
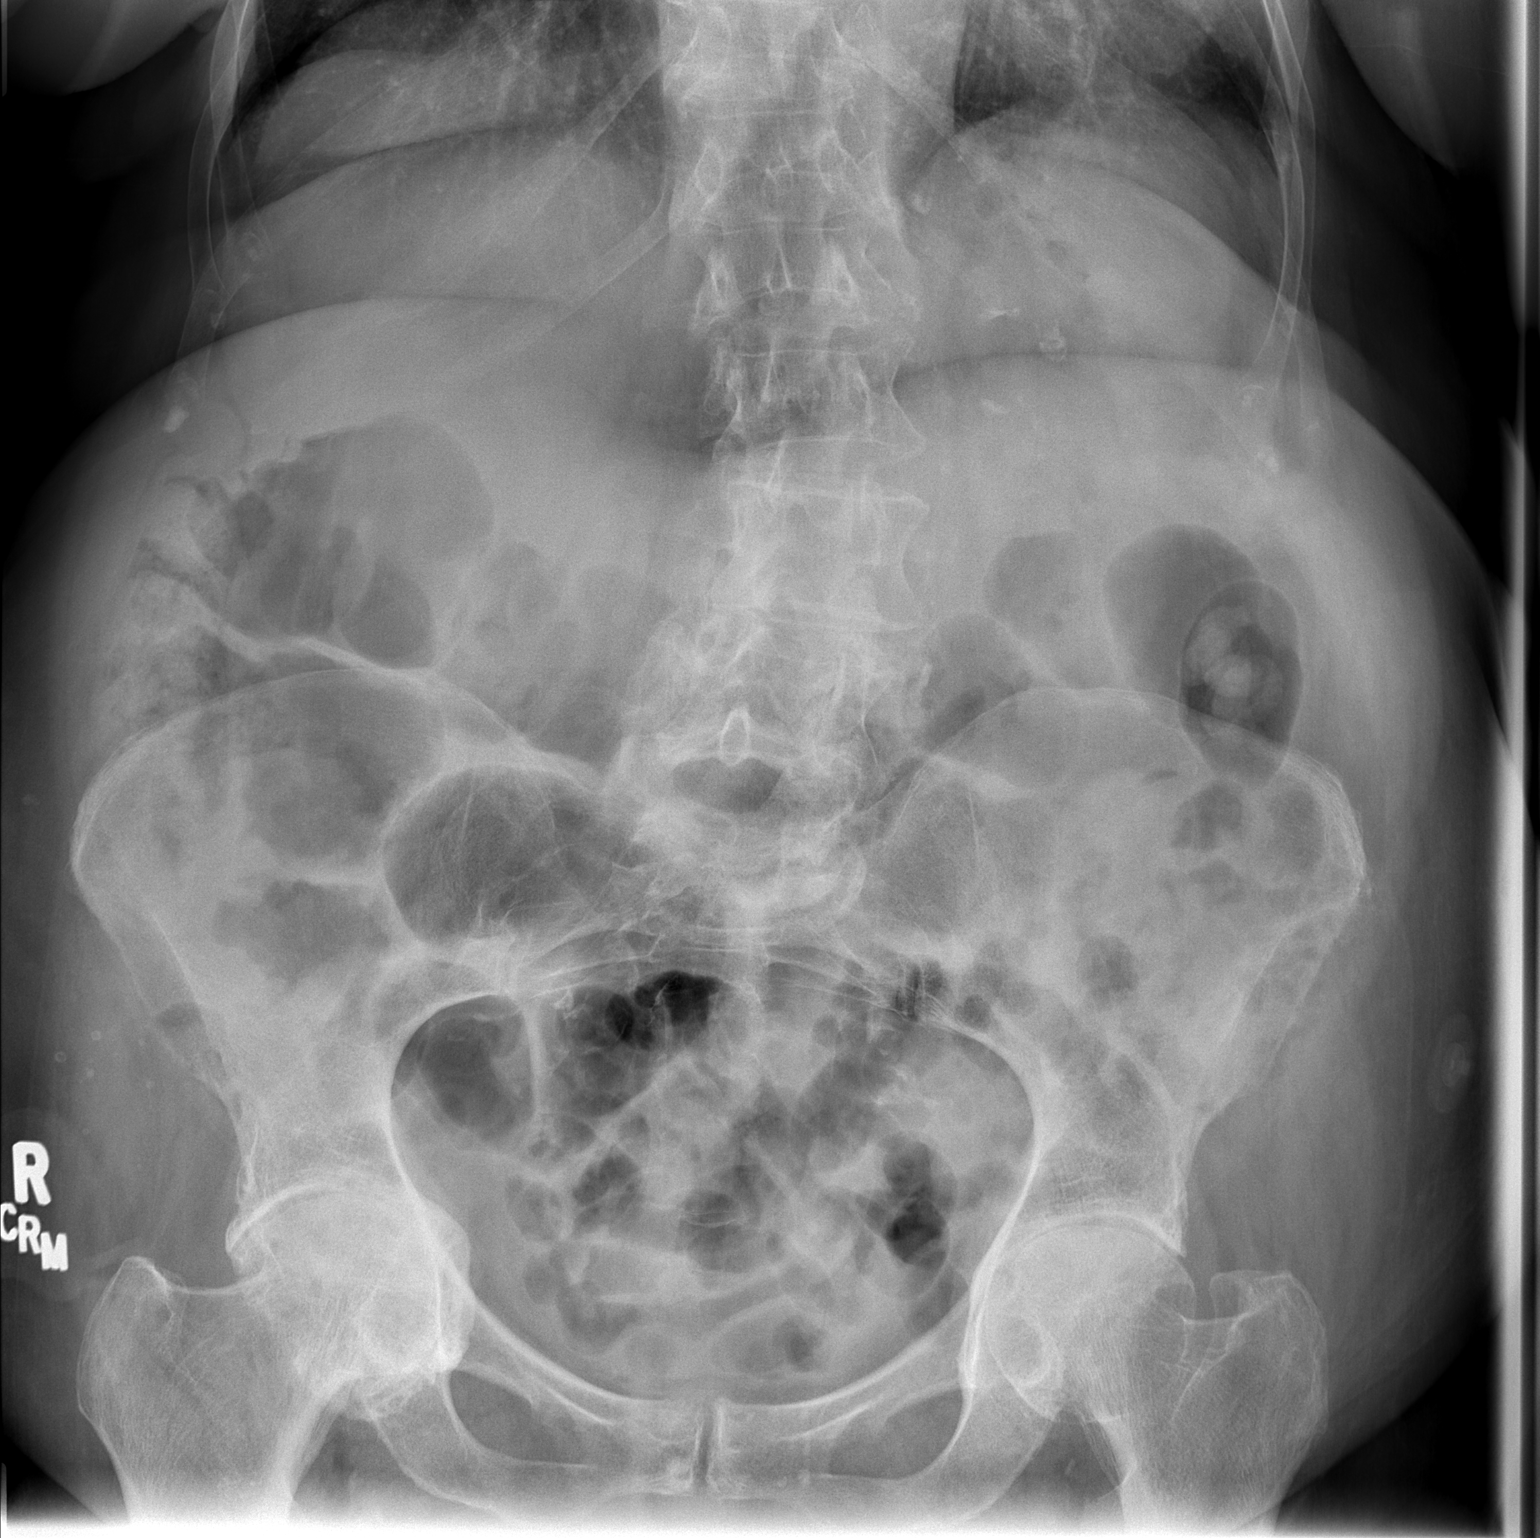

[2 of 2 positions shown; findings below may reference images not displayed]

FINDINGS: There is a nonobstructive bowel gas pattern.

There are no dilated loops of small bowel or air-fluid levels
identified.

Gas and stool is identified within the colon up to the rectum.

A few air-filled loops of small bowel are present.

Scoliosis deformity affects the lumbar spine.

There are advanced degenerative type changes involving both hips.
IMPRESSION: 1.  Nonobstructive bowel gas pattern.

## 2013-01-24 ENCOUNTER — Other Ambulatory Visit: Payer: Self-pay | Admitting: Cardiology

## 2013-02-02 ENCOUNTER — Other Ambulatory Visit: Payer: Self-pay | Admitting: Cardiology

## 2013-02-08 ENCOUNTER — Ambulatory Visit (INDEPENDENT_AMBULATORY_CARE_PROVIDER_SITE_OTHER): Payer: Medicare Other | Admitting: *Deleted

## 2013-02-08 DIAGNOSIS — I4891 Unspecified atrial fibrillation: Secondary | ICD-10-CM | POA: Diagnosis not present

## 2013-02-08 DIAGNOSIS — Z7901 Long term (current) use of anticoagulants: Secondary | ICD-10-CM | POA: Diagnosis not present

## 2013-02-08 LAB — POCT INR: INR: 2.3

## 2013-02-14 ENCOUNTER — Other Ambulatory Visit: Payer: Self-pay | Admitting: *Deleted

## 2013-02-14 MED ORDER — TRIAMCINOLONE ACETONIDE 0.1 % EX CREA: 1.0000 "application " | TOPICAL_CREAM | Freq: Every day | CUTANEOUS | Status: DC

## 2013-02-25 ENCOUNTER — Other Ambulatory Visit: Payer: Self-pay | Admitting: Cardiology

## 2013-03-03 ENCOUNTER — Telehealth: Payer: Self-pay | Admitting: *Deleted

## 2013-03-03 ENCOUNTER — Other Ambulatory Visit: Payer: Self-pay | Admitting: Cardiology

## 2013-03-03 NOTE — Telephone Encounter (Signed)
Refill request: Per the pharmacy - the request has been rejected two different times - they directed patient to her provider. Per the patient Dr. Antoine Poche has been giving the ok for the prescription since  She does not have a pcp.  Pharmacy: [facility] . [phone] Prescription: fluticasone (FLONASE) 50 MCG/ACT nasal spray

## 2013-03-04 ENCOUNTER — Telehealth: Payer: Self-pay | Admitting: *Deleted

## 2013-03-04 DIAGNOSIS — R0602 Shortness of breath: Secondary | ICD-10-CM

## 2013-03-04 DIAGNOSIS — I4891 Unspecified atrial fibrillation: Secondary | ICD-10-CM

## 2013-03-04 DIAGNOSIS — I509 Heart failure, unspecified: Secondary | ICD-10-CM

## 2013-03-04 NOTE — Telephone Encounter (Signed)
Discussed below with patient.  No other c/o SOB, dizziness, or chest pain.  Feet has always swollen some, but seems to be more x past month.  States she did start walking with walker out in front of her apartment.  Had to stop this due to the swelling.  Swelling does go down at night.  OV scheduled June 24.    Also, is refill on Flonase okay?  States she still does not have PMD & does not plan to get one if she can help it.

## 2013-03-04 NOTE — Telephone Encounter (Signed)
Refill request: Per the pharmacy - the request has been rejected two different times - they directed patient to her provider. Per the patient Dr. Antoine Poche has been giving the ok for the prescription since She does not have a pcp.  Pharmacy: [facility] . [phone]  Prescription: fluticasone (FLONASE) 50 MCG/ACT nasal spray    Message: bilateral feet are swelling this has been happening for about 1 week now. They swell after she start moving aroung

## 2013-03-06 NOTE — Telephone Encounter (Signed)
OK to refill

## 2013-03-08 NOTE — Telephone Encounter (Signed)
Any suggestions on her feet swelling issue below?

## 2013-03-09 IMAGING — CR DG ANKLE COMPLETE 3+V*R*
3 series · 3 of 3 positions shown · non-contrast
Comparison: None.

CLINICAL DATA: Right foot and ankle pain for 2 months.

RIGHT ANKLE - COMPLETE 3+ VIEW

[view not recorded (1 of 3)]
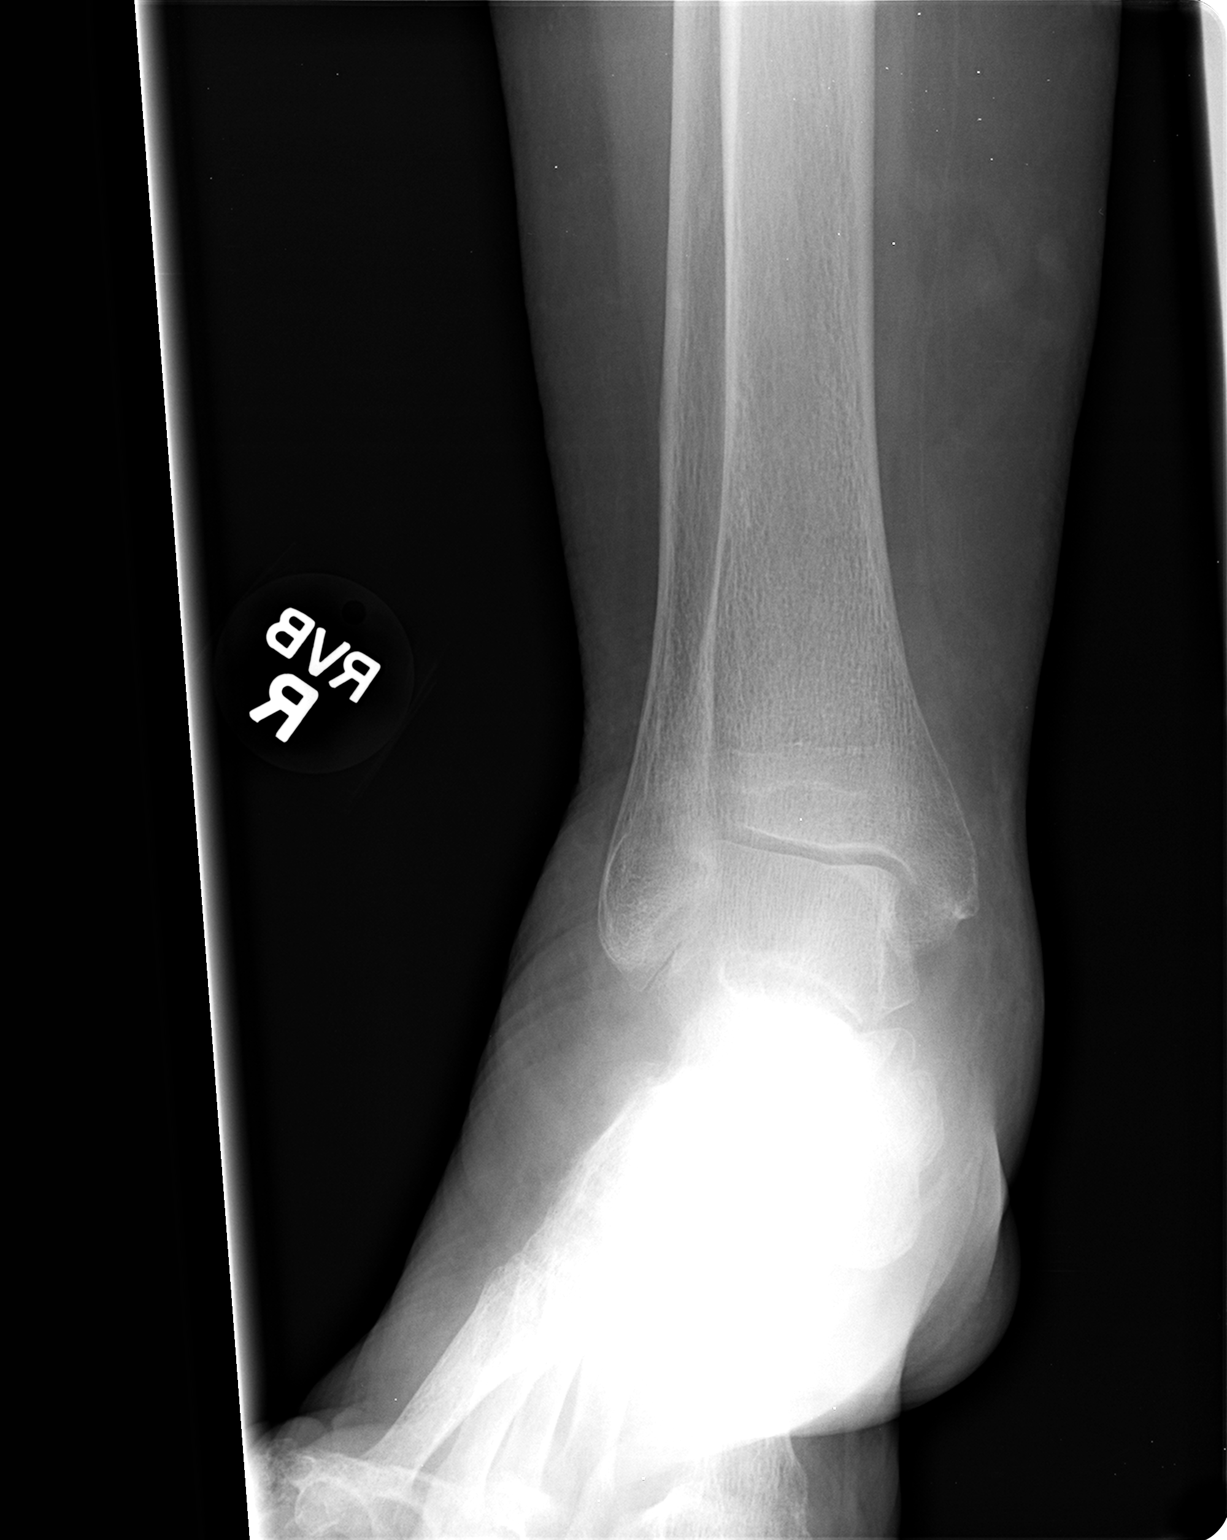

[view not recorded (2 of 3)]
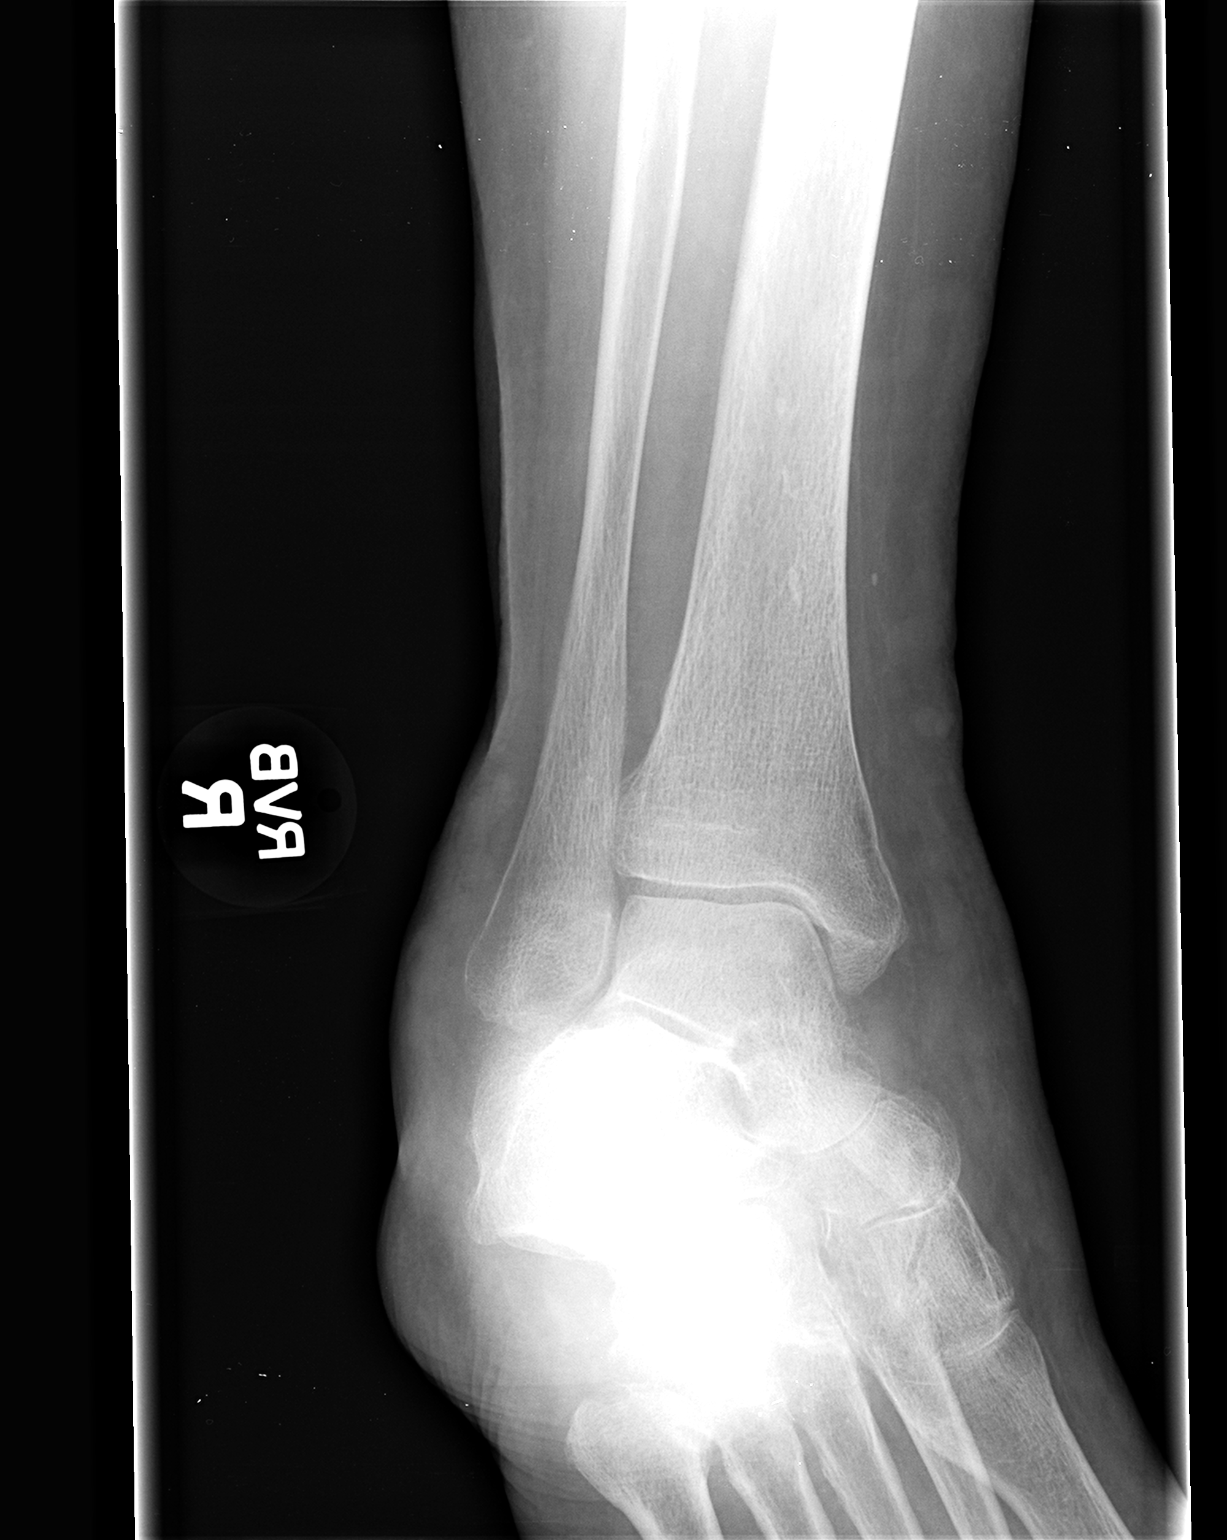

[view not recorded (3 of 3)]
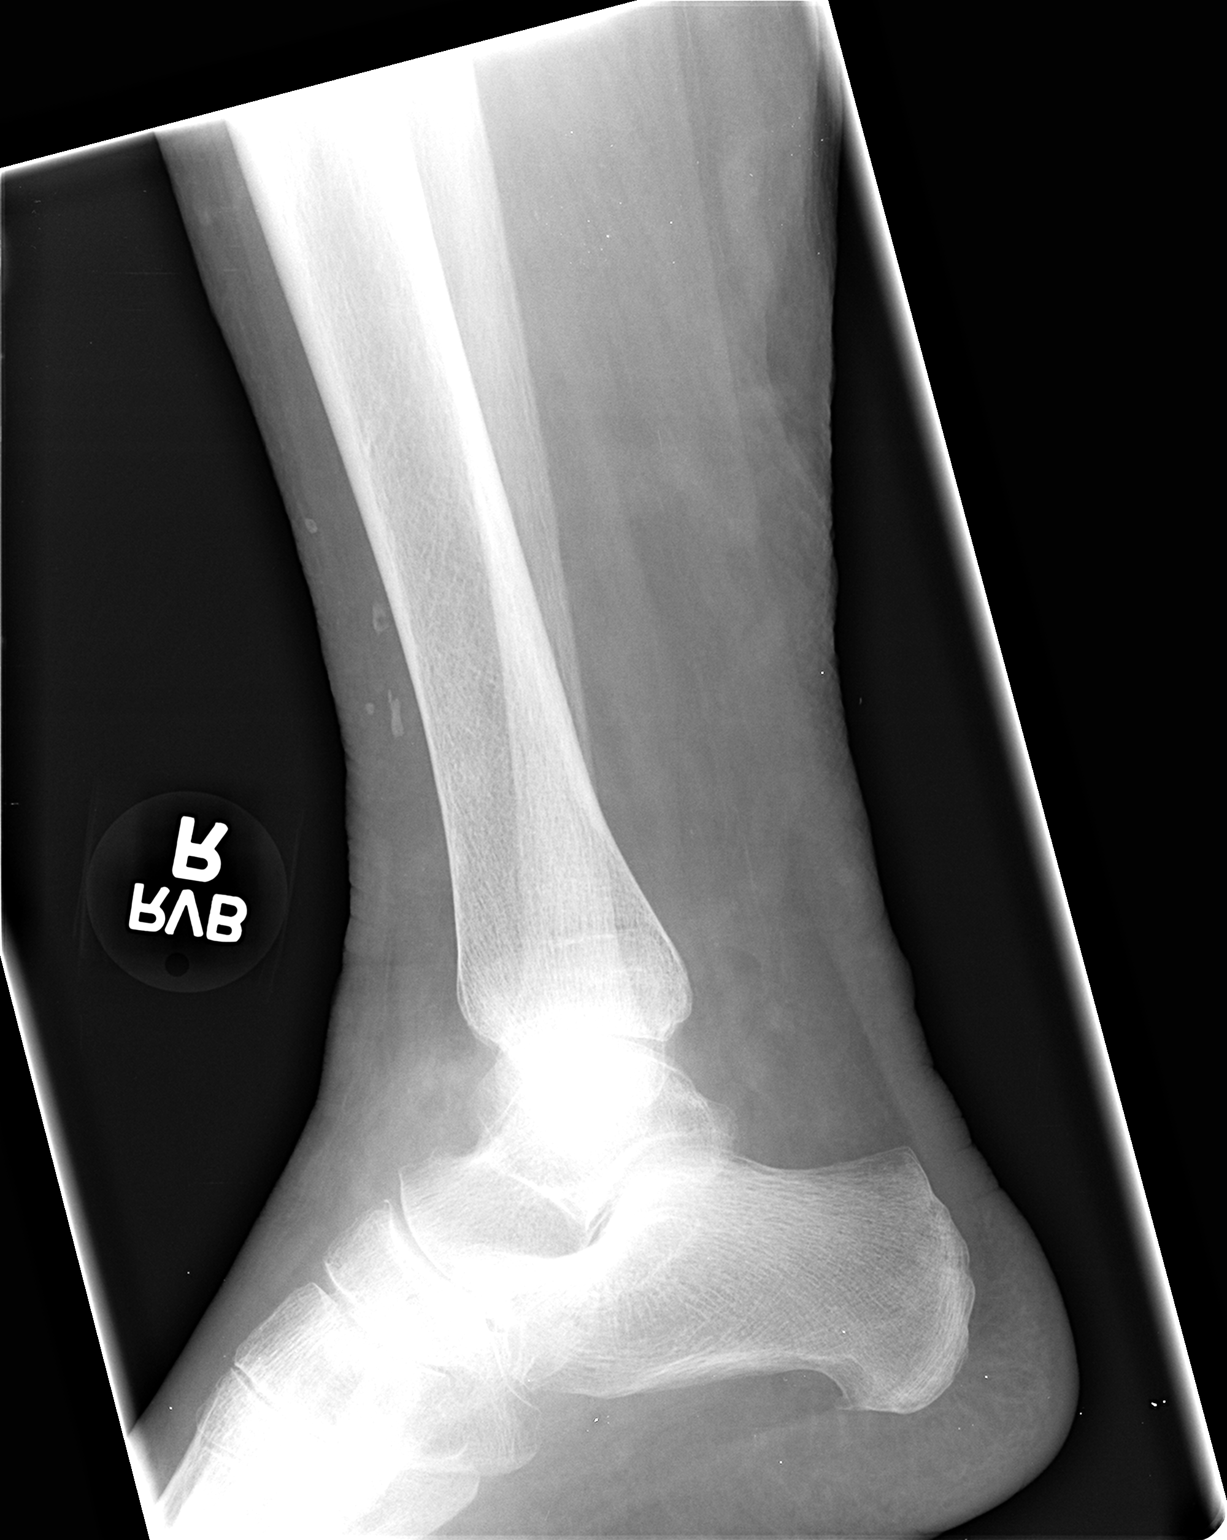

[3 of 3 positions shown; findings below may reference images not displayed]

FINDINGS: Diffuse soft tissue swelling.  Osteopenia.  No fracture
or dislocation. No apparent gas in the soft tissues.  No definite
osseous destructive lesion.  Phleboliths in the lower leg are
noted.
IMPRESSION: Soft tissue swelling without focal osseous abnormality.

## 2013-03-10 NOTE — Telephone Encounter (Signed)
Before suggesting therapy I would need to see a basic metabolic profile and BNP

## 2013-03-11 ENCOUNTER — Ambulatory Visit (INDEPENDENT_AMBULATORY_CARE_PROVIDER_SITE_OTHER): Payer: Medicare Other | Admitting: *Deleted

## 2013-03-11 ENCOUNTER — Telehealth: Payer: Self-pay

## 2013-03-11 DIAGNOSIS — Z7901 Long term (current) use of anticoagulants: Secondary | ICD-10-CM

## 2013-03-11 DIAGNOSIS — I4891 Unspecified atrial fibrillation: Secondary | ICD-10-CM

## 2013-03-11 LAB — POCT INR: INR: 2.4

## 2013-03-11 MED ORDER — FLUTICASONE PROPIONATE 50 MCG/ACT NA SUSP: 2.0000 | Freq: Every day | NASAL | Status: DC

## 2013-03-11 NOTE — Telephone Encounter (Signed)
Patient in office today for appointment, asked about [employer] and why it has not be refilled.  Please call patient.

## 2013-03-11 NOTE — Telephone Encounter (Signed)
Flonase refill sent to pharm.

## 2013-03-17 ENCOUNTER — Other Ambulatory Visit: Payer: Self-pay | Admitting: *Deleted

## 2013-03-17 ENCOUNTER — Encounter: Payer: Self-pay | Admitting: *Deleted

## 2013-03-17 DIAGNOSIS — I4891 Unspecified atrial fibrillation: Secondary | ICD-10-CM

## 2013-03-17 DIAGNOSIS — I509 Heart failure, unspecified: Secondary | ICD-10-CM

## 2013-03-17 DIAGNOSIS — R0602 Shortness of breath: Secondary | ICD-10-CM

## 2013-03-17 NOTE — Telephone Encounter (Signed)
After several attempts to reach patient by phone, information mailed to home address to notify patient she needed labs before further treatment.

## 2013-03-21 ENCOUNTER — Other Ambulatory Visit: Payer: Self-pay | Admitting: Cardiology

## 2013-03-22 ENCOUNTER — Emergency Department (HOSPITAL_COMMUNITY): Payer: Medicare Other

## 2013-03-22 ENCOUNTER — Encounter (HOSPITAL_COMMUNITY): Payer: Self-pay | Admitting: *Deleted

## 2013-03-22 ENCOUNTER — Emergency Department (HOSPITAL_COMMUNITY)
Admission: EM | Admit: 2013-03-22 | Discharge: 2013-03-23 | Disposition: A | Discharge status: Home / Self Care | Payer: Medicare Other | Attending: Emergency Medicine | Admitting: Emergency Medicine

## 2013-03-22 DIAGNOSIS — Z880 Allergy status to penicillin: Secondary | ICD-10-CM | POA: Diagnosis not present

## 2013-03-22 DIAGNOSIS — Z79899 Other long term (current) drug therapy: Secondary | ICD-10-CM | POA: Insufficient documentation

## 2013-03-22 DIAGNOSIS — Z8639 Personal history of other endocrine, nutritional and metabolic disease: Secondary | ICD-10-CM | POA: Diagnosis not present

## 2013-03-22 DIAGNOSIS — I10 Essential (primary) hypertension: Secondary | ICD-10-CM | POA: Insufficient documentation

## 2013-03-22 DIAGNOSIS — IMO0002 Reserved for concepts with insufficient information to code with codable children: Secondary | ICD-10-CM | POA: Insufficient documentation

## 2013-03-22 DIAGNOSIS — Z8679 Personal history of other diseases of the circulatory system: Secondary | ICD-10-CM | POA: Insufficient documentation

## 2013-03-22 DIAGNOSIS — J449 Chronic obstructive pulmonary disease, unspecified: Secondary | ICD-10-CM | POA: Insufficient documentation

## 2013-03-22 DIAGNOSIS — Z8719 Personal history of other diseases of the digestive system: Secondary | ICD-10-CM | POA: Diagnosis not present

## 2013-03-22 DIAGNOSIS — Z7901 Long term (current) use of anticoagulants: Secondary | ICD-10-CM | POA: Diagnosis not present

## 2013-03-22 DIAGNOSIS — G4700 Insomnia, unspecified: Secondary | ICD-10-CM | POA: Diagnosis not present

## 2013-03-22 DIAGNOSIS — M7989 Other specified soft tissue disorders: Secondary | ICD-10-CM | POA: Diagnosis not present

## 2013-03-22 DIAGNOSIS — I4891 Unspecified atrial fibrillation: Secondary | ICD-10-CM | POA: Insufficient documentation

## 2013-03-22 DIAGNOSIS — I509 Heart failure, unspecified: Secondary | ICD-10-CM | POA: Insufficient documentation

## 2013-03-22 DIAGNOSIS — Z8739 Personal history of other diseases of the musculoskeletal system and connective tissue: Secondary | ICD-10-CM | POA: Insufficient documentation

## 2013-03-22 DIAGNOSIS — M79674 Pain in right toe(s): Secondary | ICD-10-CM

## 2013-03-22 DIAGNOSIS — M79609 Pain in unspecified limb: Secondary | ICD-10-CM | POA: Diagnosis not present

## 2013-03-22 DIAGNOSIS — J4489 Other specified chronic obstructive pulmonary disease: Secondary | ICD-10-CM | POA: Insufficient documentation

## 2013-03-22 LAB — COMPREHENSIVE METABOLIC PANEL
AST: 16 U/L (ref 0–37)
Albumin: 4 g/dL (ref 3.5–5.2)
Alkaline Phosphatase: 59 U/L (ref 39–117)
Chloride: 98 mEq/L (ref 96–112)
Potassium: 4 mEq/L (ref 3.5–5.1)
Total Bilirubin: 0.3 mg/dL (ref 0.3–1.2)

## 2013-03-22 LAB — CBC WITH DIFFERENTIAL/PLATELET
Basophils Absolute: 0 10*3/uL (ref 0.0–0.1)
Basophils Relative: 0 % (ref 0–1)
Hemoglobin: 13.4 g/dL (ref 12.0–15.0)
MCHC: 34.4 g/dL (ref 30.0–36.0)
Neutro Abs: 3.2 10*3/uL (ref 1.7–7.7)
Neutrophils Relative %: 42 % — ABNORMAL LOW (ref 43–77)
RDW: 13 % (ref 11.5–15.5)

## 2013-03-22 NOTE — ED Notes (Signed)
Pt reporting swelling in lower extremities.  States edema has been going on for a couple months, reports worse in past few days if she walks too much.   Denies being on diuretics.  Reports that she has to see her cardiologist in a few weeks and requesting lab work be done prior to appointment.

## 2013-03-22 NOTE — ED Notes (Signed)
Pt c/o swelling to bilateral lower legs recently, denies any SOB or CP but family member in room reports pt has "heart problems", pt states her PMD is Dr. Antoine Poche with cardiology, had warfarin level checked last 03/12/13

## 2013-03-22 NOTE — ED Provider Notes (Signed)
History     This chart was scribed for Jones Skene, MD, MD by Smitty Pluck, ED Scribe. The patient was seen in room APA05/APA05 and the patient's care was started at 11:37 PM.   CSN: [id number]  Arrival date & time 03/22/13  2005    Chief Complaint  Patient presents with  . Leg Swelling    The history is provided by the patient and medical records. No language interpreter was used.   HPI Comments: Bevely Hackbart is a 79 y.o. female with hx of CHF, HTN, atrial fibrillation, who presents to the Emergency Department complaining of intermittent, moderate, right great toe pain and swelling onset 1 day ago. Pt states she normally has swelling in bilateral feet that subsides when she rests but worsens throughout the day. Reports having varicose veins removed from legs. Pt states she has decreased sleep. Pt denies fever, chills, nausea, vomiting, diarrhea, weakness, cough, SOB, CP and any other pain. She reports having right lower extremity surgery. Pt takes coumadin.    Past Medical History  Diagnosis Date  . CHF (congestive heart failure)     EF  30%  Echo 01/17/11  . Hypertension   . Atrial fibrillation   . Hernia   . Hyposmolality and/or hyponatremia   . Esophageal reflux   . Chronic airway obstruction, not elsewhere classified   . Morbid obesity   . Osteoarthrosis, unspecified whether generalized or localized, unspecified site   . Mitral regurgitation   . Moderate tricuspid regurgitation by prior echocardiogram     Past Surgical History  Procedure Laterality Date  . Abdominal hysterectomy    . Right lower extremity surgery      Family History  Problem Relation Age of Onset  . Heart attack Mother   . Alzheimer's disease Father     History  Substance Use Topics  . Smoking status: Never Smoker   . Smokeless tobacco: Never Used  . Alcohol Use: No    OB History   Grav Para Term Preterm Abortions TAB SAB Ect Mult Living                  Review of Systems At least  10pt or greater review of systems completed and are negative except where specified in the HPI.  Allergies  Garlic and Penicillins  Home Medications   Current Outpatient Rx  Name  Route  Sig  Dispense  Refill  . acetaminophen (TYLENOL) 500 MG tablet   Oral   Take 1,000 mg by mouth every 6 (six) hours as needed for pain.          . carvedilol (COREG) 12.5 MG tablet   Oral   Take 18.75 mg by mouth 2 (two) times daily.         . Chlorpheniramine-APAP (CORICIDIN) 2-325 MG TABS   Oral   Take 1-2 tablets by mouth daily as needed (for symptoms).         Marland Kitchen digoxin (LANOXIN) 0.25 MG tablet   Oral   Take 0.25 mg by mouth daily.         . fluticasone (FLONASE) 50 MCG/ACT nasal spray   Nasal   Place 2 sprays into the nose daily.   16 g   3   . losartan (COZAAR) 50 MG tablet   Oral   Take 50 mg by mouth daily.         . naproxen sodium (ALEVE) 220 MG tablet   Oral   Take 220 mg by mouth  daily as needed (Alternates taking this medication and Tylenol only if needed for arthritis pain).         . polyethylene glycol powder (GLYCOLAX/MIRALAX) powder   Oral   Take 17 g by mouth daily as needed (for laxative/constipation).         . Saline (SIMPLY SALINE) 0.9 % AERS   Nasal   Place 1 spray into the nose daily as needed (for congestion).         Marland Kitchen senna (SENOKOT) 8.6 MG tablet   Oral   Take 1 tablet (8.6 mg total) by mouth daily as needed.   30 tablet   6   . triamcinolone cream (KENALOG) 0.1 %   Topical   Apply 1 application topically at bedtime as needed (for skin irritation).         . warfarin (COUMADIN) 5 MG tablet   Oral   Take 5 mg by mouth daily. Take one tablet (5mg ) every day.Take one-half tablet (2.5mg ) on Saturdays ONLY           BP 197/78  Pulse 65  Temp(Src) 98.1 F (36.7 C) (Oral)  Resp 18  Ht 5\' 7"  (1.702 m)  Wt 198 lb (89.812 kg)  BMI 31 kg/m2  SpO2 99%  Physical Exam  Nursing note and vitals reviewed.   Nursing notes  reviewed.  Electronic medical record reviewed. VITAL SIGNS:   Filed Vitals:   03/22/13 2042  BP: 197/78  Pulse: 65  Temp: 98.1 F (36.7 C)  TempSrc: Oral  Resp: 18  Height: 5\' 7"  (1.702 m)  Weight: 198 lb (89.812 kg)  SpO2: 99%   CONSTITUTIONAL: Awake, oriented, appears non-toxic HENT: Atraumatic, normocephalic, oral mucosa pink and moist, airway patent. Nares patent without drainage. External ears normal. EYES: Conjunctiva clear, EOMI, PERRLA NECK: Trachea midline, non-tender, supple CARDIOVASCULAR: Normal heart rate, Normal rhythm, No murmurs, rubs, gallops PULMONARY/CHEST: Clear to auscultation, no rhonchi, wheezes, or rales. Symmetrical breath sounds. Non-tender. ABDOMINAL: Non-distended, soft, non-tender - no rebound or guarding.  BS normal. NEUROLOGIC: Non-focal, moving all four extremities, no gross sensory or motor deficits. EXTREMITIES: No clubbing, cyanosis. Skin changes consistent with venous stasis in the lower extremities, 2+ lower extremity pitting edema bilaterally, no tenderness along the deep vein system. SKIN: Warm, Dry, No erythema, No rash  ED Course  Procedures (including critical care time) DIAGNOSTIC STUDIES: Oxygen Saturation is 99% on room air, normal by my interpretation.    COORDINATION OF CARE: 11:40 PM Discussed ED treatment with pt and pt agrees.     Labs Reviewed  CBC WITH DIFFERENTIAL - Abnormal; Notable for the following:    Neutrophils Relative % 42 (*)    All other components within normal limits  COMPREHENSIVE METABOLIC PANEL - Abnormal; Notable for the following:    Glucose, Bld 112 (*)    GFR calc non Af Amer 57 (*)    GFR calc Af Amer 66 (*)    All other components within normal limits  PRO B NATRIURETIC PEPTIDE   Dg Foot Complete Right  03/23/2013   *RADIOLOGY REPORT*  Clinical Data: Right foot pain and swelling for 2 days, erythema at the right great toe through third toe  RIGHT FOOT COMPLETE - 3+ VIEW  Comparison: None.   Findings: Evaluation is optimal due to hyperextension at the MTP joint.  Allowing for this, no fracture or dislocation is evident. Bones are osteopenic, which may mask subtle fracture.  No overt bony destruction.  No radiopaque foreign body. Mild  soft tissue edema is noted projecting over the ankle.  IMPRESSION: No acute abnormality or osseous destruction to suggest osteomyelitis.  If symptoms continue, consider repeat imaging in 10- 14 days or MRI.   Original Report Authenticated By: Christiana Pellant, M.D.     1. Leg swelling   2. Pain in toe of right foot   3. Insomnia       MDM  Patient arrives with concern for leg swelling and small toe pain on the right foot. No fracture is seen on the right foot however the patient is diffusely osteopenic.  Patient's leg swelling is likely secondary to repeated vein stripping and reduced venous return. Labs are unremarkable at this point. No indication of a medical or surgical emergency, patient not in congestive heart failure, pulmonary edema. Do not suspect bilateral DVT s especially with fluctuation of her lower extremity edema as it resolves when her feet are up during the night, she is low risk by Wells criteria for DVT.   I personally performed the services described in this documentation, which was scribed in my presence. The recorded information has been reviewed and is accurate. Jones Skene, M.D.      Jones Skene, MD 03/24/13 9147

## 2013-03-23 DIAGNOSIS — M79609 Pain in unspecified limb: Secondary | ICD-10-CM | POA: Diagnosis not present

## 2013-03-23 DIAGNOSIS — M7989 Other specified soft tissue disorders: Secondary | ICD-10-CM | POA: Diagnosis not present

## 2013-03-23 MED ORDER — MELATONIN 3 MG PO TABS: 1.0000 | ORAL_TABLET | Freq: Every evening | ORAL | Status: DC | PRN

## 2013-04-12 ENCOUNTER — Ambulatory Visit (INDEPENDENT_AMBULATORY_CARE_PROVIDER_SITE_OTHER): Payer: Medicare Other | Admitting: *Deleted

## 2013-04-12 ENCOUNTER — Encounter: Payer: Self-pay | Admitting: Cardiology

## 2013-04-12 ENCOUNTER — Ambulatory Visit (INDEPENDENT_AMBULATORY_CARE_PROVIDER_SITE_OTHER): Payer: Medicare Other | Admitting: Cardiology

## 2013-04-12 VITALS — BP 155/82 | HR 53 | Ht 67.0 in | Wt 189.0 lb

## 2013-04-12 DIAGNOSIS — I10 Essential (primary) hypertension: Secondary | ICD-10-CM

## 2013-04-12 DIAGNOSIS — I340 Nonrheumatic mitral (valve) insufficiency: Secondary | ICD-10-CM

## 2013-04-12 DIAGNOSIS — I059 Rheumatic mitral valve disease, unspecified: Secondary | ICD-10-CM

## 2013-04-12 DIAGNOSIS — I4891 Unspecified atrial fibrillation: Secondary | ICD-10-CM

## 2013-04-12 DIAGNOSIS — Z7901 Long term (current) use of anticoagulants: Secondary | ICD-10-CM

## 2013-04-12 LAB — POCT INR: INR: 3.1

## 2013-04-12 NOTE — Patient Instructions (Addendum)

## 2013-04-12 NOTE — Progress Notes (Signed)
HPI The patient presents for follow up of her atrial fibrillation and cardiomyopathy. Since I last saw her she has continued to have increased stress with her apartment.  She does have some moderate chronic edema which she tries to treat by keeping her feet elevated. She gets around with a rolling walker in her home.   She denies any new cardiovascular symptoms.  She denies any palpitations, presyncope or syncope. She denies any chest pressure, neck or arm discomfort. She is limited by musculoskeletal pains. She doesn't have any new shortness of breath, PND or orthopnea.  She sleeps chronically on 2 pillows.  Of note I did review the records recently. She was there because of some foot pain. They did evaluate her lower extremity edema with a pro BNP was very slightly elevated.   Allergies  Allergen Reactions  . Garlic Rash  . Penicillins     Swelling     Current Outpatient Prescriptions  Medication Sig Dispense Refill  . acetaminophen (TYLENOL) 500 MG tablet Take 1,000 mg by mouth every 6 (six) hours as needed for pain.       . carvedilol (COREG) 12.5 MG tablet Take 18.75 mg by mouth 2 (two) times daily.      . Chlorpheniramine-APAP (CORICIDIN) 2-325 MG TABS Take 1-2 tablets by mouth daily as needed (for symptoms).      Marland Kitchen digoxin (LANOXIN) 0.25 MG tablet Take 0.25 mg by mouth daily.      . fluticasone (FLONASE) 50 MCG/ACT nasal spray Place 2 sprays into the nose daily.  16 g  3  . losartan (COZAAR) 50 MG tablet Take 50 mg by mouth daily.      . naproxen sodium (ALEVE) 220 MG tablet Take 220 mg by mouth daily as needed (Alternates taking this medication and Tylenol only if needed for arthritis pain).      . polyethylene glycol powder (GLYCOLAX/MIRALAX) powder Take 17 g by mouth daily as needed (for laxative/constipation).      . Saline (SIMPLY SALINE) 0.9 % AERS Place 1 spray into the nose daily as needed (for congestion).      Marland Kitchen senna (SENOKOT) 8.6 MG tablet Take 1 tablet (8.6 mg total)  by mouth daily as needed.  30 tablet  6  . triamcinolone cream (KENALOG) 0.1 % Apply 1 application topically at bedtime as needed (for skin irritation).      . warfarin (COUMADIN) 5 MG tablet Take 5 mg by mouth daily. Take one tablet (5mg ) every day.Take one-half tablet (2.5mg ) on Saturdays ONLY       No current facility-administered medications for this visit.    Past Medical History  Diagnosis Date  . CHF (congestive heart failure)     EF  30%  Echo 01/17/11  . Hypertension   . Atrial fibrillation   . Hernia   . Hyposmolality and/or hyponatremia   . Esophageal reflux   . Chronic airway obstruction, not elsewhere classified   . Morbid obesity   . Osteoarthrosis, unspecified whether generalized or localized, unspecified site   . Mitral regurgitation   . Moderate tricuspid regurgitation by prior echocardiogram     Past Surgical History  Procedure Laterality Date  . Abdominal hysterectomy    . Right lower extremity surgery     ROS:   Insomnia. Otherwise as stated in the HPI and negative for all other systems  PHYSICAL EXAM BP 155/82  Pulse 53  Ht 5\' 7"  (1.702 m)  Wt 189 lb (85.73 kg)  BMI 29.59 kg/m2 GEN: No distress  NECK: No jugular venous distention at 90 degrees, waveform within normal limits, carotid upstroke brisk and symmetric, no bruits, no thyromegaly  LYMPHATICS: No cervical adenopathy  LUNGS: Clear to auscultation bilaterally  BACK: No CVA tenderness  CHEST: Unremarkable  HEART: S1 and S2 within normal limits, no S3, no clicks, no rubs, no murmurs, regular ABD: Positive bowel sounds normal in frequency in pitch, no bruits, no rebound, no guarding, unable to assess midline mass or bruit with the patient seated.  EXT: 2 plus pulses throughout, mild edema in both feet, no cyanosis no clubbing    ASSESSMENT AND PLAN  Atrial fibrillation -  She has had no symptomatic recurrence of this. She tolerates anticoagulation. She's not having any trouble with warfarin and  so it would not suggest a NOAC.  CHF (congestive heart failure) -  I would like to check another echocardiogram as a couple of years. Her EF in 2012 is about 30%. For now she will continue the meds as listed.  Hypertension -  Her blood pressure is elevated.  I would like her to keep a blood pressure diary as I not sure if this is atypical for her. She'll let me know if it's running at this level consistently and I will make an adjustment probably give her Cozaar.

## 2013-05-11 ENCOUNTER — Other Ambulatory Visit: Payer: Self-pay | Admitting: Cardiology

## 2013-05-11 MED ORDER — DIGOXIN 250 MCG PO TABS: 0.2500 mg | ORAL_TABLET | Freq: Every day | ORAL | Status: DC

## 2013-05-20 ENCOUNTER — Ambulatory Visit (INDEPENDENT_AMBULATORY_CARE_PROVIDER_SITE_OTHER): Payer: Medicare Other | Admitting: *Deleted

## 2013-05-20 DIAGNOSIS — Z7901 Long term (current) use of anticoagulants: Secondary | ICD-10-CM

## 2013-05-20 DIAGNOSIS — I4891 Unspecified atrial fibrillation: Secondary | ICD-10-CM | POA: Diagnosis not present

## 2013-05-20 LAB — POCT INR: INR: 3.6

## 2013-06-24 ENCOUNTER — Ambulatory Visit (INDEPENDENT_AMBULATORY_CARE_PROVIDER_SITE_OTHER): Payer: Medicare Other | Admitting: *Deleted

## 2013-06-24 DIAGNOSIS — I4891 Unspecified atrial fibrillation: Secondary | ICD-10-CM | POA: Diagnosis not present

## 2013-06-24 DIAGNOSIS — Z7901 Long term (current) use of anticoagulants: Secondary | ICD-10-CM

## 2013-07-22 ENCOUNTER — Ambulatory Visit (INDEPENDENT_AMBULATORY_CARE_PROVIDER_SITE_OTHER): Payer: Medicare Other | Admitting: *Deleted

## 2013-07-22 DIAGNOSIS — I4891 Unspecified atrial fibrillation: Secondary | ICD-10-CM | POA: Diagnosis not present

## 2013-07-22 DIAGNOSIS — Z7901 Long term (current) use of anticoagulants: Secondary | ICD-10-CM | POA: Diagnosis not present

## 2013-08-03 ENCOUNTER — Other Ambulatory Visit: Payer: Self-pay | Admitting: Cardiology

## 2013-08-17 ENCOUNTER — Ambulatory Visit (INDEPENDENT_AMBULATORY_CARE_PROVIDER_SITE_OTHER): Payer: Medicare Other | Admitting: Cardiology

## 2013-08-17 DIAGNOSIS — R0609 Other forms of dyspnea: Secondary | ICD-10-CM

## 2013-08-17 DIAGNOSIS — I4891 Unspecified atrial fibrillation: Secondary | ICD-10-CM

## 2013-08-17 DIAGNOSIS — I059 Rheumatic mitral valve disease, unspecified: Secondary | ICD-10-CM

## 2013-08-23 ENCOUNTER — Telehealth: Payer: Self-pay | Admitting: Cardiology

## 2013-08-23 NOTE — Telephone Encounter (Signed)
Pt informed

## 2013-08-23 NOTE — Telephone Encounter (Signed)
Message copied by Burnice Logan on Tue Aug 23, 2013  4:39 PM ------      Message from: FLEMING, PAMELA J      Created: Mon Aug 22, 2013  6:01 PM                   ----- Message -----         From: Rollene Rotunda, MD         Sent: 08/21/2013  11:31 AM           To: Rocco Serene, RN            The EF is much improved compared with previous.  Low normal.  Continue current therapy.  Call Ms. Harmon with the results. ------

## 2013-08-30 ENCOUNTER — Ambulatory Visit (INDEPENDENT_AMBULATORY_CARE_PROVIDER_SITE_OTHER): Payer: Medicare Other | Admitting: *Deleted

## 2013-08-30 DIAGNOSIS — Z7901 Long term (current) use of anticoagulants: Secondary | ICD-10-CM

## 2013-08-30 DIAGNOSIS — I4891 Unspecified atrial fibrillation: Secondary | ICD-10-CM

## 2013-10-05 ENCOUNTER — Other Ambulatory Visit: Payer: Self-pay | Admitting: Cardiology

## 2013-10-28 ENCOUNTER — Ambulatory Visit (INDEPENDENT_AMBULATORY_CARE_PROVIDER_SITE_OTHER): Payer: Medicare Other | Admitting: *Deleted

## 2013-10-28 DIAGNOSIS — I4891 Unspecified atrial fibrillation: Secondary | ICD-10-CM

## 2013-10-28 DIAGNOSIS — Z7901 Long term (current) use of anticoagulants: Secondary | ICD-10-CM

## 2013-10-28 LAB — POCT INR: INR: 2

## 2013-11-01 ENCOUNTER — Other Ambulatory Visit: Payer: Self-pay | Admitting: Cardiology

## 2013-12-05 ENCOUNTER — Other Ambulatory Visit: Payer: Self-pay | Admitting: Cardiology

## 2013-12-16 ENCOUNTER — Ambulatory Visit (INDEPENDENT_AMBULATORY_CARE_PROVIDER_SITE_OTHER): Payer: Medicare Other | Admitting: *Deleted

## 2013-12-16 DIAGNOSIS — Z7901 Long term (current) use of anticoagulants: Secondary | ICD-10-CM

## 2013-12-16 DIAGNOSIS — I4891 Unspecified atrial fibrillation: Secondary | ICD-10-CM

## 2013-12-16 DIAGNOSIS — Z5181 Encounter for therapeutic drug level monitoring: Secondary | ICD-10-CM | POA: Diagnosis not present

## 2013-12-16 LAB — POCT INR: INR: 2.5

## 2014-01-03 ENCOUNTER — Encounter: Payer: Self-pay | Admitting: Cardiology

## 2014-01-03 ENCOUNTER — Ambulatory Visit (INDEPENDENT_AMBULATORY_CARE_PROVIDER_SITE_OTHER): Payer: Medicare Other | Admitting: Cardiology

## 2014-01-03 VITALS — BP 154/78 | HR 58 | Ht 67.0 in | Wt 185.8 lb

## 2014-01-03 DIAGNOSIS — I079 Rheumatic tricuspid valve disease, unspecified: Secondary | ICD-10-CM | POA: Diagnosis not present

## 2014-01-03 DIAGNOSIS — I340 Nonrheumatic mitral (valve) insufficiency: Secondary | ICD-10-CM

## 2014-01-03 DIAGNOSIS — I4891 Unspecified atrial fibrillation: Secondary | ICD-10-CM | POA: Diagnosis not present

## 2014-01-03 DIAGNOSIS — I059 Rheumatic mitral valve disease, unspecified: Secondary | ICD-10-CM

## 2014-01-03 DIAGNOSIS — I071 Rheumatic tricuspid insufficiency: Secondary | ICD-10-CM

## 2014-01-03 MED ORDER — UNABLE TO FIND: Status: DC

## 2014-01-03 NOTE — Patient Instructions (Signed)

## 2014-01-03 NOTE — Progress Notes (Signed)
HPI The patient presents for follow up of her atrial fibrillation and cardiomyopathy.  Since I last saw her she has moved out of her apartment which was giving her stress and she is doing better. We did do an echocardiogram in October of last year an ejection fraction which had been about 30% was now to 50-55%.  She gets around with a rolling walker in her home.   She denies any new cardiovascular symptoms.  She denies any palpitations, presyncope or syncope. She denies any chest pressure, neck or arm discomfort. She doesn't have any new shortness of breath, PND or orthopnea.  She is having less lower externally swelling than she did previously.   Allergies  Allergen Reactions  . Garlic Rash  . Penicillins     Swelling     Current Outpatient Prescriptions  Medication Sig Dispense Refill  . acetaminophen (TYLENOL) 500 MG tablet Take 1,000 mg by mouth every 6 (six) hours as needed for pain.       . carvedilol (COREG) 12.5 MG tablet TAKE 1 & 1/2 TABLETS BY MOUTH TWICE A DAY  90 tablet  5  . digoxin (LANOXIN) 0.25 MG tablet TAKE 1 TABLET BY MOUTH EVERY DAY  30 tablet  6  . fluticasone (FLONASE) 50 MCG/ACT nasal spray USE 2 SPRAYS IN EACH NOSTRIL ONCE DAILY  16 g  3  . losartan (COZAAR) 50 MG tablet Take 50 mg by mouth daily.      . naproxen sodium (ALEVE) 220 MG tablet Take 220 mg by mouth daily as needed (Alternates taking this medication and Tylenol only if needed for arthritis pain).      . polyethylene glycol powder (GLYCOLAX/MIRALAX) powder Take 17 g by mouth daily as needed (for laxative/constipation).      . Saline (SIMPLY SALINE) 0.9 % AERS Place 1 spray into the nose daily as needed (for congestion).      Marland Kitchen. senna (SENOKOT) 8.6 MG tablet Take 1 tablet (8.6 mg total) by mouth daily as needed.  30 tablet  6  . traMADol (ULTRAM) 50 MG tablet Take 50 mg by mouth every 6 (six) hours as needed.      . triamcinolone cream (KENALOG) 0.1 % Apply 1 application topically at bedtime as needed (for  skin irritation).      . warfarin (COUMADIN) 5 MG tablet Take 5 mg by mouth daily. Take one tablet (5mg ) every day.Take one-half tablet (2.5mg ) on Saturdays ONLY       No current facility-administered medications for this visit.    Past Medical History  Diagnosis Date  . CHF (congestive heart failure)     EF  30%  Echo 01/17/11  . Hypertension   . Atrial fibrillation   . Hernia   . Hyposmolality and/or hyponatremia   . Esophageal reflux   . Chronic airway obstruction, not elsewhere classified   . Morbid obesity   . Osteoarthrosis, unspecified whether generalized or localized, unspecified site   . Mitral regurgitation   . Moderate tricuspid regurgitation by prior echocardiogram     Past Surgical History  Procedure Laterality Date  . Abdominal hysterectomy    . Right lower extremity surgery     ROS:   Insomnia. Otherwise as stated in the HPI and negative for all other systems  PHYSICAL EXAM BP 154/78  Pulse 58  Ht 5\' 7"  (1.702 m)  Wt 185 lb 12.8 oz (84.278 kg)  BMI 29.09 kg/m2 GEN: No distress  NECK: No jugular venous distention at  90 degrees, waveform within normal limits, carotid upstroke brisk and symmetric, no bruits, no thyromegaly  LYMPHATICS: No cervical adenopathy  LUNGS: Clear to auscultation bilaterally  BACK: No CVA tenderness  CHEST: Unremarkable  HEART: S1 and S2 within normal limits, no S3, no clicks, no rubs, no murmurs, regular ABD: Positive bowel sounds normal in frequency in pitch, no bruits, no rebound, no guarding, unable to assess midline mass or bruit with the patient seated.  EXT: 2 plus pulses throughout, no edema , no cyanosis no clubbing   EKG:  Sinus rhythm rate 58, significant first degree AV block, right bundle branch block, no acute ST-T wave changes. 01/03/2014   ASSESSMENT AND PLAN  Atrial fibrillation -  She has had no symptomatic recurrence of this. She tolerates anticoagulation. She's not having any trouble with warfarin and so it  would not suggest a NOAC.  CHF (congestive heart failure) -  Her EF is improved.  No change in therapy is indicated.    Hypertension -  Her blood pressure is elevated.  She will keep a BP diary and I will treat based on further data points.

## 2014-01-27 ENCOUNTER — Ambulatory Visit (INDEPENDENT_AMBULATORY_CARE_PROVIDER_SITE_OTHER): Payer: Medicare Other | Admitting: *Deleted

## 2014-01-27 DIAGNOSIS — Z7901 Long term (current) use of anticoagulants: Secondary | ICD-10-CM

## 2014-01-27 DIAGNOSIS — I4891 Unspecified atrial fibrillation: Secondary | ICD-10-CM

## 2014-01-27 DIAGNOSIS — Z5181 Encounter for therapeutic drug level monitoring: Secondary | ICD-10-CM

## 2014-01-27 LAB — POCT INR: INR: 3

## 2014-01-31 ENCOUNTER — Telehealth: Payer: Self-pay | Admitting: *Deleted

## 2014-01-31 ENCOUNTER — Other Ambulatory Visit: Payer: Self-pay | Admitting: *Deleted

## 2014-01-31 MED ORDER — WARFARIN SODIUM 5 MG PO TABS: ORAL_TABLET | ORAL | Status: DC

## 2014-01-31 NOTE — Telephone Encounter (Signed)
Coumadin refill

## 2014-03-06 ENCOUNTER — Other Ambulatory Visit: Payer: Self-pay | Admitting: *Deleted

## 2014-03-06 MED ORDER — LOSARTAN POTASSIUM 50 MG PO TABS: 50.0000 mg | ORAL_TABLET | Freq: Every day | ORAL | Status: DC

## 2014-03-10 ENCOUNTER — Ambulatory Visit (INDEPENDENT_AMBULATORY_CARE_PROVIDER_SITE_OTHER): Payer: Medicare Other | Admitting: *Deleted

## 2014-03-10 DIAGNOSIS — Z7901 Long term (current) use of anticoagulants: Secondary | ICD-10-CM

## 2014-03-10 DIAGNOSIS — Z5181 Encounter for therapeutic drug level monitoring: Secondary | ICD-10-CM

## 2014-03-10 DIAGNOSIS — I4891 Unspecified atrial fibrillation: Secondary | ICD-10-CM | POA: Diagnosis not present

## 2014-03-10 LAB — POCT INR: INR: 2.7

## 2014-04-07 ENCOUNTER — Other Ambulatory Visit: Payer: Self-pay

## 2014-04-07 MED ORDER — CARVEDILOL 12.5 MG PO TABS: ORAL_TABLET | ORAL | Status: DC

## 2014-04-28 ENCOUNTER — Ambulatory Visit (INDEPENDENT_AMBULATORY_CARE_PROVIDER_SITE_OTHER): Payer: Medicare Other | Admitting: *Deleted

## 2014-04-28 DIAGNOSIS — Z7901 Long term (current) use of anticoagulants: Secondary | ICD-10-CM | POA: Diagnosis not present

## 2014-04-28 DIAGNOSIS — I4891 Unspecified atrial fibrillation: Secondary | ICD-10-CM

## 2014-04-28 DIAGNOSIS — Z5181 Encounter for therapeutic drug level monitoring: Secondary | ICD-10-CM | POA: Diagnosis not present

## 2014-04-28 LAB — POCT INR: INR: 2.4

## 2014-05-22 ENCOUNTER — Other Ambulatory Visit: Payer: Self-pay

## 2014-05-23 ENCOUNTER — Other Ambulatory Visit: Payer: Self-pay | Admitting: *Deleted

## 2014-05-23 MED ORDER — FLUTICASONE PROPIONATE 50 MCG/ACT NA SUSP: 2.0000 | Freq: Every day | NASAL | Status: DC

## 2014-05-23 MED ORDER — POLYETHYLENE GLYCOL 3350 17 GM/SCOOP PO POWD: 17.0000 g | Freq: Every day | ORAL | Status: DC | PRN

## 2014-06-09 ENCOUNTER — Ambulatory Visit (INDEPENDENT_AMBULATORY_CARE_PROVIDER_SITE_OTHER): Payer: Medicare Other | Admitting: *Deleted

## 2014-06-09 DIAGNOSIS — I4891 Unspecified atrial fibrillation: Secondary | ICD-10-CM

## 2014-06-09 DIAGNOSIS — Z7901 Long term (current) use of anticoagulants: Secondary | ICD-10-CM | POA: Diagnosis not present

## 2014-06-09 DIAGNOSIS — Z5181 Encounter for therapeutic drug level monitoring: Secondary | ICD-10-CM | POA: Diagnosis not present

## 2014-06-09 LAB — POCT INR: INR: 3

## 2014-07-25 ENCOUNTER — Ambulatory Visit (INDEPENDENT_AMBULATORY_CARE_PROVIDER_SITE_OTHER): Payer: Medicare Other | Admitting: Pharmacist Clinician (PhC)/ Clinical Pharmacy Specialist

## 2014-07-25 DIAGNOSIS — I4891 Unspecified atrial fibrillation: Secondary | ICD-10-CM

## 2014-07-25 DIAGNOSIS — Z5181 Encounter for therapeutic drug level monitoring: Secondary | ICD-10-CM

## 2014-07-25 DIAGNOSIS — Z7901 Long term (current) use of anticoagulants: Secondary | ICD-10-CM | POA: Diagnosis not present

## 2014-07-25 LAB — POCT INR: INR: 2.9

## 2014-07-25 MED ORDER — DIGOXIN 250 MCG PO TABS: ORAL_TABLET | ORAL | Status: DC

## 2014-07-25 MED ORDER — CARVEDILOL 12.5 MG PO TABS: ORAL_TABLET | ORAL | Status: DC

## 2014-07-25 MED ORDER — WARFARIN SODIUM 5 MG PO TABS: ORAL_TABLET | ORAL | Status: DC

## 2014-07-25 MED ORDER — LOSARTAN POTASSIUM 50 MG PO TABS: 50.0000 mg | ORAL_TABLET | Freq: Every day | ORAL | Status: DC

## 2014-09-05 ENCOUNTER — Ambulatory Visit (INDEPENDENT_AMBULATORY_CARE_PROVIDER_SITE_OTHER): Payer: Medicare Other | Admitting: *Deleted

## 2014-09-05 DIAGNOSIS — Z5181 Encounter for therapeutic drug level monitoring: Secondary | ICD-10-CM

## 2014-09-05 DIAGNOSIS — Z7901 Long term (current) use of anticoagulants: Secondary | ICD-10-CM

## 2014-09-05 DIAGNOSIS — I4891 Unspecified atrial fibrillation: Secondary | ICD-10-CM | POA: Diagnosis not present

## 2014-09-05 LAB — POCT INR: INR: 3.6

## 2014-10-03 ENCOUNTER — Ambulatory Visit (INDEPENDENT_AMBULATORY_CARE_PROVIDER_SITE_OTHER): Payer: Medicare Other | Admitting: *Deleted

## 2014-10-03 DIAGNOSIS — Z5181 Encounter for therapeutic drug level monitoring: Secondary | ICD-10-CM | POA: Diagnosis not present

## 2014-10-03 DIAGNOSIS — Z7901 Long term (current) use of anticoagulants: Secondary | ICD-10-CM | POA: Diagnosis not present

## 2014-10-03 DIAGNOSIS — I4891 Unspecified atrial fibrillation: Secondary | ICD-10-CM | POA: Diagnosis not present

## 2014-10-03 LAB — POCT INR: INR: 2.5

## 2014-10-03 MED ORDER — FLUTICASONE PROPIONATE 50 MCG/ACT NA SUSP: 2.0000 | Freq: Every day | NASAL | Status: DC

## 2014-10-22 ENCOUNTER — Other Ambulatory Visit: Payer: Self-pay | Admitting: Cardiology

## 2014-10-23 ENCOUNTER — Other Ambulatory Visit: Payer: Self-pay | Admitting: *Deleted

## 2014-10-23 MED ORDER — SENNOSIDES 8.6 MG PO TABS: 1.0000 | ORAL_TABLET | Freq: Every day | ORAL | Status: DC | PRN

## 2014-11-07 ENCOUNTER — Ambulatory Visit (INDEPENDENT_AMBULATORY_CARE_PROVIDER_SITE_OTHER): Payer: Medicare Other | Admitting: *Deleted

## 2014-11-07 DIAGNOSIS — Z7901 Long term (current) use of anticoagulants: Secondary | ICD-10-CM

## 2014-11-07 DIAGNOSIS — Z5181 Encounter for therapeutic drug level monitoring: Secondary | ICD-10-CM

## 2014-11-07 DIAGNOSIS — I4891 Unspecified atrial fibrillation: Secondary | ICD-10-CM | POA: Diagnosis not present

## 2014-11-07 LAB — POCT INR: INR: 2.6

## 2014-12-07 ENCOUNTER — Ambulatory Visit (INDEPENDENT_AMBULATORY_CARE_PROVIDER_SITE_OTHER): Payer: Medicare Other | Admitting: *Deleted

## 2014-12-07 DIAGNOSIS — Z5181 Encounter for therapeutic drug level monitoring: Secondary | ICD-10-CM

## 2014-12-07 DIAGNOSIS — I481 Persistent atrial fibrillation: Secondary | ICD-10-CM

## 2014-12-07 DIAGNOSIS — Z7901 Long term (current) use of anticoagulants: Secondary | ICD-10-CM | POA: Diagnosis not present

## 2014-12-07 DIAGNOSIS — I4891 Unspecified atrial fibrillation: Secondary | ICD-10-CM | POA: Diagnosis not present

## 2014-12-07 DIAGNOSIS — I4819 Other persistent atrial fibrillation: Secondary | ICD-10-CM

## 2014-12-07 LAB — POCT INR: INR: 2.2

## 2015-01-03 ENCOUNTER — Encounter: Payer: Self-pay | Admitting: Cardiology

## 2015-01-03 ENCOUNTER — Ambulatory Visit (INDEPENDENT_AMBULATORY_CARE_PROVIDER_SITE_OTHER): Payer: Medicare Other | Admitting: Cardiology

## 2015-01-03 VITALS — BP 156/77 | HR 52 | Ht 67.0 in | Wt 172.0 lb

## 2015-01-03 DIAGNOSIS — I480 Paroxysmal atrial fibrillation: Secondary | ICD-10-CM

## 2015-01-03 NOTE — Patient Instructions (Addendum)
The current medical regimen is effective;  continue present plan and medications.  Please have blood work at [facility].  [facility](BMP,DIG)  Follow up in 6 months with Dr. Antoine PocheHochrein.  You will receive a letter in the mail 2 months before you are due.  Please call us when you receive this letter to schedule your follow up appointment.  Thank you for choosing [facility]!!

## 2015-01-03 NOTE — Progress Notes (Signed)
HPI The patient presents for follow up of her atrial fibrillation and cardiomyopathy.  Since I last saw her she is doing relatively well.  She gets around with a rolling walker in her home.   She denies any new cardiovascular symptoms.  She denies any palpitations, presyncope or syncope. She denies any chest pressure, neck or arm discomfort. She doesn't have any new shortness of breath, PND or orthopnea.  She has lost weight by eating differently.  She is down 40 pounds from her peak. She's having trouble affording her digoxin, because the price went up.     Allergies  Allergen Reactions  . Garlic Rash  . Penicillins     Swelling     Current Outpatient Prescriptions  Medication Sig Dispense Refill  . acetaminophen (TYLENOL) 500 MG tablet Take 1,000 mg by mouth every 6 (six) hours as needed for pain.     . carvedilol (COREG) 12.5 MG tablet TAKE 1 & 1/2 TABLETS BY MOUTH TWICE A DAY 270 tablet 1  . digoxin (LANOXIN) 0.25 MG tablet TAKE 1 TABLET BY MOUTH EVERY DAY 90 tablet 1  . fluticasone (FLONASE) 50 MCG/ACT nasal spray Place 2 sprays into both nostrils daily. 16 g 3  . losartan (COZAAR) 50 MG tablet Take 1 tablet (50 mg total) by mouth daily. 90 tablet 1  . naproxen sodium (ALEVE) 220 MG tablet Take 220 mg by mouth daily as needed (Alternates taking this medication and Tylenol only if needed for arthritis pain).    . polyethylene glycol powder (GLYCOLAX/MIRALAX) powder Take 17 g by mouth daily as needed (for laxative/constipation). 527 g 3  . Saline (SIMPLY SALINE) 0.9 % AERS Place 1 spray into the nose daily as needed (for congestion).    Marland Kitchen. senna (SENOKOT) 8.6 MG tablet Take 1 tablet (8.6 mg total) by mouth daily as needed. 30 tablet 6  . traMADol (ULTRAM) 50 MG tablet Take 50 mg by mouth every 6 (six) hours as needed.    . triamcinolone cream (KENALOG) 0.1 % Apply 1 application topically at bedtime as needed (for skin irritation).    . warfarin (COUMADIN) 5 MG tablet Take 1 tablet by  mouth daily or as directed by coumadin clinic 90 tablet 1  . UNABLE TO FIND HOVEROUND/SCOOTER USE AS DIRECTED #1 DX:CHF,ATRIAL FIBRILLATION,MITRAL REGURGITATION 1 each 0   No current facility-administered medications for this visit.    Past Medical History  Diagnosis Date  . CHF (congestive heart failure)     EF  30%  Echo 01/17/11  . Hypertension   . Atrial fibrillation   . Hernia   . Hyposmolality and/or hyponatremia   . Esophageal reflux   . Chronic airway obstruction, not elsewhere classified   . Morbid obesity   . Osteoarthrosis, unspecified whether generalized or localized, unspecified site   . Mitral regurgitation   . Moderate tricuspid regurgitation by prior echocardiogram     Past Surgical History  Procedure Laterality Date  . Abdominal hysterectomy    . Right lower extremity surgery     ROS:   Seeing "specks"  Insomnia. Otherwise as stated in the HPI and negative for all other systems  PHYSICAL EXAM BP 156/77 mmHg  Pulse 52  Ht 5\' 7"  (1.702 m)  Wt 172 lb (78.019 kg)  BMI 26.93 kg/m2 GEN: No distress  NECK: No jugular venous distention at 90 degrees, waveform within normal limits, carotid upstroke brisk and symmetric, no bruits, no thyromegaly  LYMPHATICS: No cervical adenopathy  LUNGS: Clear  to auscultation bilaterally  BACK: No CVA tenderness  CHEST: Unremarkable  HEART: S1 and S2 within normal limits, no S3, no clicks, no rubs, no murmurs, regular ABD: Positive bowel sounds normal in frequency in pitch, no bruits, no rebound, no guarding, unable to assess midline mass or bruit with the patient seated.  EXT: 2 plus pulses throughout, no edema , no cyanosis no clubbing   EKG:  Sinus rhythm rate 52, significant first degree AV block, right bundle branch block, no acute ST-T wave changes. 01/03/2015   ASSESSMENT AND PLAN  Atrial fibrillation -  She has had no symptomatic recurrence of this. She tolerates anticoagulation. She's not having any trouble with  warfarin and so it would not suggest a NOAC.  She does have conduction disturbances with a slow heart rhythm but tolerates the medicines as listed. I would like to get a digoxin level however. For now I'm going to continue the meds as listed.  CHF (congestive heart failure) -  Her EF is improved.  No change in therapy is indicated.    Hypertension -  Her blood pressure slightly elevated.  However, he is with an acceptable range for now I will avoid further polypharmacy.

## 2015-01-09 ENCOUNTER — Telehealth: Payer: Self-pay | Admitting: *Deleted

## 2015-01-09 ENCOUNTER — Ambulatory Visit (INDEPENDENT_AMBULATORY_CARE_PROVIDER_SITE_OTHER): Payer: Medicare Other | Admitting: *Deleted

## 2015-01-09 DIAGNOSIS — I4891 Unspecified atrial fibrillation: Secondary | ICD-10-CM

## 2015-01-09 DIAGNOSIS — Z7901 Long term (current) use of anticoagulants: Secondary | ICD-10-CM

## 2015-01-09 DIAGNOSIS — Z5181 Encounter for therapeutic drug level monitoring: Secondary | ICD-10-CM | POA: Diagnosis not present

## 2015-01-09 LAB — POCT INR: INR: 2.2

## 2015-01-09 NOTE — Telephone Encounter (Signed)
Prior authorization paperwork filled out and faxed back to [employer] at [phone].

## 2015-01-09 NOTE — Telephone Encounter (Signed)
Prior authorization form for digoxin 250 mcg requested

## 2015-01-10 ENCOUNTER — Telehealth: Payer: Self-pay | Admitting: Cardiology

## 2015-01-10 NOTE — Telephone Encounter (Signed)
[facility] to Dr ToysRusHochrien's nurse. Needs prior authorization-see note dated today's date-01-10-15. J C is not here, I sent it to Refill Pool.

## 2015-01-10 NOTE — Telephone Encounter (Signed)
Pt need prior authorization Digoxin. Please fax to [phone].

## 2015-01-10 NOTE — Telephone Encounter (Signed)
PA was submitted on 01/09/15 by Stanton Kidneyebra, RN

## 2015-01-10 NOTE — Telephone Encounter (Signed)
We do not do PA's [facility]  Nurse

## 2015-01-11 DIAGNOSIS — H3531 Nonexudative age-related macular degeneration: Secondary | ICD-10-CM | POA: Diagnosis not present

## 2015-01-15 ENCOUNTER — Encounter: Payer: Self-pay | Admitting: *Deleted

## 2015-01-17 ENCOUNTER — Other Ambulatory Visit: Payer: Self-pay | Admitting: Cardiology

## 2015-01-17 ENCOUNTER — Telehealth: Payer: Self-pay | Admitting: Cardiology

## 2015-01-17 DIAGNOSIS — Z7901 Long term (current) use of anticoagulants: Secondary | ICD-10-CM | POA: Diagnosis not present

## 2015-01-17 DIAGNOSIS — I4891 Unspecified atrial fibrillation: Secondary | ICD-10-CM | POA: Diagnosis not present

## 2015-01-17 DIAGNOSIS — I10 Essential (primary) hypertension: Secondary | ICD-10-CM | POA: Diagnosis not present

## 2015-01-17 NOTE — Telephone Encounter (Signed)
New message      Daughter received a call from the ins co stating that the decision on digoxin being covered was sent to the physician.  She is calling to see if they are going to pay for the medication.  Do you know anything?

## 2015-01-17 NOTE — Telephone Encounter (Signed)
Spoke with shandra, they are asking if the benefit of the patient taking digoxin out ways the risk. Aware the benefit for the patient does out way the risk.

## 2015-01-18 ENCOUNTER — Encounter: Payer: Self-pay | Admitting: Cardiology

## 2015-01-19 NOTE — Telephone Encounter (Signed)
Spoke with shandra, insurance has approved digoxin. Left message for pt dtr to call

## 2015-01-22 NOTE — Telephone Encounter (Signed)
Spoke with pt dtr, aware digoxin was approved

## 2015-01-24 ENCOUNTER — Other Ambulatory Visit: Payer: Self-pay | Admitting: *Deleted

## 2015-01-24 MED ORDER — DIGOXIN 125 MCG PO TABS: 125.0000 ug | ORAL_TABLET | Freq: Every day | ORAL | Status: DC

## 2015-02-15 ENCOUNTER — Other Ambulatory Visit: Payer: Self-pay | Admitting: Cardiology

## 2015-02-25 ENCOUNTER — Other Ambulatory Visit: Payer: Self-pay | Admitting: Cardiology

## 2015-02-27 ENCOUNTER — Ambulatory Visit (INDEPENDENT_AMBULATORY_CARE_PROVIDER_SITE_OTHER): Payer: Medicare Other | Admitting: *Deleted

## 2015-02-27 DIAGNOSIS — I4891 Unspecified atrial fibrillation: Secondary | ICD-10-CM

## 2015-02-27 DIAGNOSIS — Z5181 Encounter for therapeutic drug level monitoring: Secondary | ICD-10-CM

## 2015-02-27 DIAGNOSIS — Z7901 Long term (current) use of anticoagulants: Secondary | ICD-10-CM | POA: Diagnosis not present

## 2015-02-27 LAB — POCT INR: INR: 1.8

## 2015-02-28 ENCOUNTER — Other Ambulatory Visit: Payer: Self-pay | Admitting: *Deleted

## 2015-02-28 MED ORDER — FLUTICASONE PROPIONATE 50 MCG/ACT NA SUSP: 2.0000 | Freq: Every day | NASAL | Status: DC

## 2015-03-01 NOTE — Telephone Encounter (Signed)
Med refilled.

## 2015-03-20 ENCOUNTER — Ambulatory Visit (INDEPENDENT_AMBULATORY_CARE_PROVIDER_SITE_OTHER): Payer: Medicare Other | Admitting: *Deleted

## 2015-03-20 DIAGNOSIS — Z5181 Encounter for therapeutic drug level monitoring: Secondary | ICD-10-CM | POA: Diagnosis not present

## 2015-03-20 DIAGNOSIS — Z7901 Long term (current) use of anticoagulants: Secondary | ICD-10-CM

## 2015-03-20 DIAGNOSIS — I4891 Unspecified atrial fibrillation: Secondary | ICD-10-CM | POA: Diagnosis not present

## 2015-03-20 LAB — POCT INR: INR: 2.3

## 2015-03-24 ENCOUNTER — Other Ambulatory Visit: Payer: Self-pay | Admitting: Cardiology

## 2015-04-16 ENCOUNTER — Other Ambulatory Visit: Payer: Self-pay | Admitting: Cardiology

## 2015-04-16 ENCOUNTER — Other Ambulatory Visit: Payer: Self-pay

## 2015-04-16 MED ORDER — CARVEDILOL 12.5 MG PO TABS: ORAL_TABLET | ORAL | Status: DC

## 2015-04-17 ENCOUNTER — Other Ambulatory Visit: Payer: Self-pay | Admitting: Cardiology

## 2015-04-17 NOTE — Telephone Encounter (Signed)
Rx(s) sent to pharmacy electronically.  

## 2015-04-26 DIAGNOSIS — H3531 Nonexudative age-related macular degeneration: Secondary | ICD-10-CM | POA: Diagnosis not present

## 2015-05-03 ENCOUNTER — Ambulatory Visit (INDEPENDENT_AMBULATORY_CARE_PROVIDER_SITE_OTHER): Payer: Medicare Other | Admitting: *Deleted

## 2015-05-03 DIAGNOSIS — I4891 Unspecified atrial fibrillation: Secondary | ICD-10-CM | POA: Diagnosis not present

## 2015-05-03 DIAGNOSIS — Z5181 Encounter for therapeutic drug level monitoring: Secondary | ICD-10-CM

## 2015-05-03 DIAGNOSIS — Z7901 Long term (current) use of anticoagulants: Secondary | ICD-10-CM

## 2015-05-03 LAB — POCT INR: INR: 2.8

## 2015-05-07 ENCOUNTER — Other Ambulatory Visit: Payer: Self-pay | Admitting: *Deleted

## 2015-05-07 MED ORDER — WARFARIN SODIUM 5 MG PO TABS: ORAL_TABLET | ORAL | Status: DC

## 2015-06-13 DIAGNOSIS — H3531 Nonexudative age-related macular degeneration: Secondary | ICD-10-CM | POA: Diagnosis not present

## 2015-06-13 DIAGNOSIS — H542 Low vision, both eyes: Secondary | ICD-10-CM | POA: Diagnosis not present

## 2015-06-14 ENCOUNTER — Ambulatory Visit (INDEPENDENT_AMBULATORY_CARE_PROVIDER_SITE_OTHER): Payer: Medicare Other | Admitting: *Deleted

## 2015-06-14 DIAGNOSIS — Z5181 Encounter for therapeutic drug level monitoring: Secondary | ICD-10-CM

## 2015-06-14 DIAGNOSIS — I4891 Unspecified atrial fibrillation: Secondary | ICD-10-CM | POA: Diagnosis not present

## 2015-06-14 DIAGNOSIS — Z7901 Long term (current) use of anticoagulants: Secondary | ICD-10-CM

## 2015-06-14 LAB — POCT INR: INR: 2.8

## 2015-07-11 ENCOUNTER — Ambulatory Visit (INDEPENDENT_AMBULATORY_CARE_PROVIDER_SITE_OTHER): Payer: Medicare Other | Admitting: Cardiology

## 2015-07-11 ENCOUNTER — Encounter: Payer: Self-pay | Admitting: Cardiology

## 2015-07-11 VITALS — BP 156/108 | HR 76 | Ht 67.0 in | Wt 176.0 lb

## 2015-07-11 DIAGNOSIS — I10 Essential (primary) hypertension: Secondary | ICD-10-CM

## 2015-07-11 MED ORDER — LOSARTAN POTASSIUM 25 MG PO TABS: 25.0000 mg | ORAL_TABLET | Freq: Every day | ORAL | Status: DC

## 2015-07-11 NOTE — Progress Notes (Signed)
HPI The patient presents for follow up of her atrial fibrillation and cardiomyopathy.  Since I last saw her she is doing relatively well.  She gets around with a rolling walker in her home but has limited mobility that keeps her from going out and doing as much she would like..   She denies any new cardiovascular symptoms.  She denies any palpitations, presyncope or syncope. She denies any chest pressure, neck or arm discomfort. She doesn't have any new shortness of breath, PND or orthopnea.  She has had no new edema.   Allergies  Allergen Reactions  . Garlic Rash  . Penicillins     Swelling     Current Outpatient Prescriptions  Medication Sig Dispense Refill  . acetaminophen (TYLENOL) 500 MG tablet Take 1,000 mg by mouth every 6 (six) hours as needed for pain.     . carvedilol (COREG) 12.5 MG tablet TAKE 1 & 1/2 TABLETS BY MOUTH TWICE A DAY 270 tablet 1  . digoxin (LANOXIN) 0.125 MG tablet Take 1 tablet (125 mcg total) by mouth daily. 30 tablet 12  . fluticasone (FLONASE) 50 MCG/ACT nasal spray USE 2 SPRAYS IN EACH NOSTRIL DAILY 16 g 3  . losartan (COZAAR) 50 MG tablet TAKE 1 TABLET (50 MG TOTAL) BY MOUTH DAILY. 90 tablet 1  . naproxen sodium (ALEVE) 220 MG tablet Take 220 mg by mouth daily as needed (Alternates taking this medication and Tylenol only if needed for arthritis pain).    . polyethylene glycol powder (GLYCOLAX/MIRALAX) powder Take 17 g by mouth daily as needed (for laxative/constipation). 527 g 3  . Saline (SIMPLY SALINE) 0.9 % AERS Place 1 spray into the nose daily as needed (for congestion).    Marland Kitchen senna (SENOKOT) 8.6 MG tablet Take 1 tablet (8.6 mg total) by mouth daily as needed. 30 tablet 6  . traMADol (ULTRAM) 50 MG tablet Take 50 mg by mouth every 6 (six) hours as needed.    . triamcinolone cream (KENALOG) 0.1 % Apply 1 application topically at bedtime as needed (for skin irritation).    . warfarin (COUMADIN) 5 MG tablet Take 1 tablet by mouth daily or as directed by  coumadin clinic 90 tablet 1  . UNABLE TO FIND HOVEROUND/SCOOTER USE AS DIRECTED #1 DX:CHF,ATRIAL FIBRILLATION,MITRAL REGURGITATION 1 each 0   No current facility-administered medications for this visit.    Past Medical History  Diagnosis Date  . CHF (congestive heart failure)     EF  30%  Echo 01/17/11,  EF 50% in 2014  . Hypertension   . Atrial fibrillation   . Hernia   . Hyposmolality and/or hyponatremia   . Esophageal reflux   . Chronic airway obstruction, not elsewhere classified   . Morbid obesity   . Osteoarthrosis, unspecified whether generalized or localized, unspecified site   . Mitral regurgitation   . Moderate tricuspid regurgitation by prior echocardiogram     Past Surgical History  Procedure Laterality Date  . Abdominal hysterectomy    . Right lower extremity surgery     ROS:   Decreased mobility, insomnia. Otherwise as stated in the HPI and negative for all other systems  PHYSICAL EXAM BP 156/108 mmHg  Pulse 76  Ht  (1.702 m)  Wt 176 lb (79.833 kg)  BMI 27.56 kg/m2 GEN: No distress  NECK: No jugular venous distention at 90 degrees, waveform within normal limits, carotid upstroke brisk and symmetric, no bruits, no thyromegaly  LYMPHATICS: No cervical adenopathy  LUNGS: Clear  to auscultation bilaterally  BACK: No CVA tenderness  CHEST: Unremarkable  HEART: S1 and S2 within normal limits, no S3, no clicks, no rubs, no murmurs, irregular ABD: Positive bowel sounds normal in frequency in pitch, no bruits, no rebound, no guarding, unable to assess midline mass or bruit with the patient seated.  EXT: 2 plus pulses throughout, no edema , no cyanosis no clubbing   EKG:  Atrial fibrillation, rate 76, right bundle branch block, QTC prolonged.. 07/11/2015   ASSESSMENT AND PLAN  Atrial fibrillation -  She does not notice that she's in this rhythm. She tolerates anticoagulation. She will continue the meds as listed.    Hypertension -  Her blood pressure  slightly elevated.  I will increase her losartan 75 mg daily.

## 2015-07-11 NOTE — Patient Instructions (Signed)
Medication Instructions:  Please add Losartan 25 mg a day to what you are already taking. Continue all other medications as listed.  Follow-Up: Follow up in 1 year with Dr. Antoine Poche in [place].  You will receive a letter in the mail 2 months before you are due.  Please call us when you receive this letter to schedule your follow up appointment.  Thank you for choosing [facility]!!

## 2015-07-12 ENCOUNTER — Other Ambulatory Visit: Payer: Self-pay | Admitting: Cardiology

## 2015-07-19 ENCOUNTER — Telehealth: Payer: Self-pay | Admitting: Cardiology

## 2015-07-19 NOTE — Telephone Encounter (Signed)
Needs to discuss the referral she received from [place] regarding this patient.

## 2015-07-19 NOTE — Telephone Encounter (Signed)
[facility] faxed over form to be completed Friday.  If we need another form let them know, otherwise please send them the form completed.  Thank you  Call Jola Babinski [phone]) for any questions at [facility]

## 2015-07-20 NOTE — Telephone Encounter (Signed)
Form was faxed to Avie Arenas, RN to be filled out and faxed back to [facility].

## 2015-07-26 ENCOUNTER — Telehealth: Payer: Self-pay | Admitting: Cardiology

## 2015-07-26 NOTE — Telephone Encounter (Signed)
Calling in reference to her Order for her wheel chair

## 2015-07-26 NOTE — Telephone Encounter (Signed)
Form given to Dr Hochrein to fill out 

## 2015-07-31 ENCOUNTER — Ambulatory Visit (INDEPENDENT_AMBULATORY_CARE_PROVIDER_SITE_OTHER): Payer: Medicare Other | Admitting: *Deleted

## 2015-07-31 DIAGNOSIS — I4891 Unspecified atrial fibrillation: Secondary | ICD-10-CM

## 2015-07-31 DIAGNOSIS — Z5181 Encounter for therapeutic drug level monitoring: Secondary | ICD-10-CM

## 2015-07-31 DIAGNOSIS — Z7901 Long term (current) use of anticoagulants: Secondary | ICD-10-CM

## 2015-07-31 LAB — POCT INR: INR: 2.9

## 2015-08-01 NOTE — Telephone Encounter (Signed)
Pt calling again,still waiting for the paperwork ,so she can get her wheelchair.Please let her know something today.Please let the phone ring a while,it takes her a long time to get to the phone.

## 2015-08-03 NOTE — Telephone Encounter (Signed)
Returned call to patient she stated she needed to know if paperwork for her to receive a wheelchair has been faxed to [facility].Stated she needed wheelchair.  Spoke to our [address] office Avie ArenasPam Fleming RN out of office today.  Spoke to [place] she stated Dr.Hochrein has forms.Dr.Hochrein out of office.  [facility] [place] [facility] she will refax forms.

## 2015-08-03 NOTE — Telephone Encounter (Signed)
Pt calling again,just wants to hear something.

## 2015-08-03 NOTE — Telephone Encounter (Signed)
Wheel chair forms completed and faxed to [facility] at fax # [phone].

## 2015-08-06 ENCOUNTER — Telehealth: Payer: Self-pay | Admitting: Cardiology

## 2015-08-06 NOTE — Telephone Encounter (Signed)
Returned call to [place] at [facility].Wheel Chair order re faxed.She stated they did receive order Friday 08/03/15.

## 2015-08-06 NOTE — Telephone Encounter (Signed)
[name] is calling about Wheelchair order  That was faxed over to them on Friday , but they do not have it . Please call  thanks

## 2015-09-11 ENCOUNTER — Ambulatory Visit (INDEPENDENT_AMBULATORY_CARE_PROVIDER_SITE_OTHER): Payer: Medicare Other | Admitting: *Deleted

## 2015-09-11 DIAGNOSIS — I4891 Unspecified atrial fibrillation: Secondary | ICD-10-CM

## 2015-09-11 DIAGNOSIS — Z5181 Encounter for therapeutic drug level monitoring: Secondary | ICD-10-CM | POA: Diagnosis not present

## 2015-09-11 DIAGNOSIS — Z7901 Long term (current) use of anticoagulants: Secondary | ICD-10-CM | POA: Diagnosis not present

## 2015-09-11 LAB — POCT INR: INR: 2.6

## 2015-10-04 ENCOUNTER — Other Ambulatory Visit: Payer: Self-pay | Admitting: Cardiology

## 2015-10-05 NOTE — Telephone Encounter (Signed)
Rx(s) sent to pharmacy electronically.  

## 2015-10-25 DIAGNOSIS — H353134 Nonexudative age-related macular degeneration, bilateral, advanced atrophic with subfoveal involvement: Secondary | ICD-10-CM | POA: Diagnosis not present

## 2015-10-30 ENCOUNTER — Other Ambulatory Visit: Payer: Self-pay | Admitting: Cardiology

## 2015-10-30 NOTE — Telephone Encounter (Signed)
Rx request sent to pharmacy.  

## 2015-11-19 ENCOUNTER — Other Ambulatory Visit: Payer: Self-pay | Admitting: *Deleted

## 2015-11-19 MED ORDER — POLYETHYLENE GLYCOL 3350 17 GM/SCOOP PO POWD: 17.0000 g | Freq: Every day | ORAL | Status: AC | PRN

## 2015-11-20 ENCOUNTER — Ambulatory Visit (INDEPENDENT_AMBULATORY_CARE_PROVIDER_SITE_OTHER): Payer: Medicare Other | Admitting: *Deleted

## 2015-11-20 DIAGNOSIS — Z5181 Encounter for therapeutic drug level monitoring: Secondary | ICD-10-CM

## 2015-11-20 DIAGNOSIS — I4891 Unspecified atrial fibrillation: Secondary | ICD-10-CM

## 2015-11-20 DIAGNOSIS — Z7901 Long term (current) use of anticoagulants: Secondary | ICD-10-CM | POA: Diagnosis not present

## 2015-11-20 LAB — POCT INR: INR: 3.4

## 2015-12-05 ENCOUNTER — Other Ambulatory Visit: Payer: Self-pay | Admitting: Cardiology

## 2015-12-05 DIAGNOSIS — I482 Chronic atrial fibrillation, unspecified: Secondary | ICD-10-CM

## 2015-12-06 ENCOUNTER — Other Ambulatory Visit: Payer: Self-pay | Admitting: Cardiology

## 2015-12-10 ENCOUNTER — Other Ambulatory Visit: Payer: Self-pay | Admitting: Cardiology

## 2015-12-20 ENCOUNTER — Ambulatory Visit (INDEPENDENT_AMBULATORY_CARE_PROVIDER_SITE_OTHER): Payer: Medicare Other | Admitting: *Deleted

## 2015-12-20 ENCOUNTER — Ambulatory Visit (INDEPENDENT_AMBULATORY_CARE_PROVIDER_SITE_OTHER): Payer: Medicare Other

## 2015-12-20 ENCOUNTER — Other Ambulatory Visit: Payer: Self-pay

## 2015-12-20 DIAGNOSIS — I4891 Unspecified atrial fibrillation: Secondary | ICD-10-CM

## 2015-12-20 DIAGNOSIS — I482 Chronic atrial fibrillation, unspecified: Secondary | ICD-10-CM

## 2015-12-20 DIAGNOSIS — Z7901 Long term (current) use of anticoagulants: Secondary | ICD-10-CM | POA: Diagnosis not present

## 2015-12-20 DIAGNOSIS — Z5181 Encounter for therapeutic drug level monitoring: Secondary | ICD-10-CM | POA: Diagnosis not present

## 2015-12-20 LAB — POCT INR: INR: 3.4

## 2015-12-28 ENCOUNTER — Encounter (HOSPITAL_COMMUNITY): Payer: Self-pay | Admitting: Emergency Medicine

## 2015-12-28 ENCOUNTER — Emergency Department (HOSPITAL_COMMUNITY)
Admission: EM | Admit: 2015-12-28 | Discharge: 2015-12-28 | Disposition: A | Discharge status: Home / Self Care | Payer: Medicare Other | Attending: Emergency Medicine | Admitting: Emergency Medicine

## 2015-12-28 DIAGNOSIS — J449 Chronic obstructive pulmonary disease, unspecified: Secondary | ICD-10-CM | POA: Diagnosis not present

## 2015-12-28 DIAGNOSIS — I509 Heart failure, unspecified: Secondary | ICD-10-CM | POA: Diagnosis not present

## 2015-12-28 DIAGNOSIS — Y929 Unspecified place or not applicable: Secondary | ICD-10-CM | POA: Diagnosis not present

## 2015-12-28 DIAGNOSIS — Y999 Unspecified external cause status: Secondary | ICD-10-CM | POA: Insufficient documentation

## 2015-12-28 DIAGNOSIS — I110 Hypertensive heart disease with heart failure: Secondary | ICD-10-CM | POA: Diagnosis not present

## 2015-12-28 DIAGNOSIS — R079 Chest pain, unspecified: Secondary | ICD-10-CM | POA: Diagnosis not present

## 2015-12-28 DIAGNOSIS — Y939 Activity, unspecified: Secondary | ICD-10-CM | POA: Insufficient documentation

## 2015-12-28 DIAGNOSIS — X58XXXA Exposure to other specified factors, initial encounter: Secondary | ICD-10-CM | POA: Diagnosis not present

## 2015-12-28 DIAGNOSIS — L97921 Non-pressure chronic ulcer of unspecified part of left lower leg limited to breakdown of skin: Secondary | ICD-10-CM | POA: Diagnosis not present

## 2015-12-28 DIAGNOSIS — I4891 Unspecified atrial fibrillation: Secondary | ICD-10-CM | POA: Diagnosis not present

## 2015-12-28 DIAGNOSIS — Z79899 Other long term (current) drug therapy: Secondary | ICD-10-CM | POA: Insufficient documentation

## 2015-12-28 DIAGNOSIS — S8992XA Unspecified injury of left lower leg, initial encounter: Secondary | ICD-10-CM | POA: Diagnosis present

## 2015-12-28 DIAGNOSIS — Z7901 Long term (current) use of anticoagulants: Secondary | ICD-10-CM | POA: Diagnosis not present

## 2015-12-28 DIAGNOSIS — S81802A Unspecified open wound, left lower leg, initial encounter: Secondary | ICD-10-CM | POA: Diagnosis not present

## 2015-12-28 DIAGNOSIS — L97929 Non-pressure chronic ulcer of unspecified part of left lower leg with unspecified severity: Secondary | ICD-10-CM

## 2015-12-28 MED ORDER — ACETAMINOPHEN 500 MG PO TABS
1000.0000 mg | ORAL_TABLET | Freq: Once | ORAL | Status: AC
Start: 2015-12-28 — End: 2015-12-28
  Administered 2015-12-28: 1000 mg via ORAL
  Filled 2015-12-28: qty 2

## 2015-12-28 MED ORDER — CLINDAMYCIN HCL 150 MG PO CAPS: 150.0000 mg | ORAL_CAPSULE | Freq: Four times a day (QID) | ORAL | Status: DC

## 2015-12-28 NOTE — ED Notes (Signed)
PT c/o left leg wound with bleeding at times x2 weeks after removing some dead skin. Left lower leg swelling noted with open wound noted to posterior left lower leg. Pt denies any fever.

## 2015-12-28 NOTE — Discharge Instructions (Signed)
It is very important free to follow-up for frequent INR checks and reassessment of your wound with your primary doctor and nurse. If you notice any signs of bleeding from your rectum or you hit your head please come to the emergency department.  If you were given medicines take as directed.  If you are on coumadin or contraceptives realize their levels and effectiveness is altered by many different medicines.  If you have any reaction (rash, tongues swelling, other) to the medicines stop taking and see a physician.    If your blood pressure was elevated in the ER make sure you follow up for management with a primary doctor or return for chest pain, shortness of breath or stroke symptoms.  Please follow up as directed and return to the ER or see a physician for new or worsening symptoms.  Thank you. Filed Vitals:   12/28/15 1716 12/28/15 1939  BP: 168/79 175/78  Pulse: 55 61  Temp: 97.5 F (36.4 C)   TempSrc: Oral   Resp: 18 14  Height: 5\' 7"  (1.702 m)   Weight: 175 lb (79.379 kg)   SpO2: 99% 100%

## 2015-12-28 NOTE — ED Provider Notes (Signed)
CSN: [id number]     Arrival date & time 12/28/15  1653 History   First MD Initiated Contact with Patient 12/28/15 1922     Chief Complaint  Patient presents with  . Leg Injury     (Consider location/radiation/quality/duration/timing/severity/associated sxs/prior Treatment) HPI Comments: 82 year old female with atrial fibrillation on Coumadin presents with poor healing wound on the left lower extremity. Patient has had this for 2 weeks. Patient denies any fevers chills or vomiting. Mild worsening pain and drainage with mild bleeding. Patient feels well otherwise. Patient denies diabetes.  The history is provided by the patient.    Past Medical History  Diagnosis Date  . CHF (congestive heart failure) (HCC)     EF  30%  Echo 01/17/11,  EF 50% in 2014  . Hypertension   . Atrial fibrillation (HCC)   . Hernia   . Hyposmolality and/or hyponatremia   . Esophageal reflux   . Chronic airway obstruction, not elsewhere classified   . Morbid obesity (HCC)   . Osteoarthrosis, unspecified whether generalized or localized, unspecified site   . Mitral regurgitation   . Moderate tricuspid regurgitation by prior echocardiogram    Past Surgical History  Procedure Laterality Date  . Abdominal hysterectomy    . Right lower extremity surgery     Family History  Problem Relation Age of Onset  . Heart attack Mother   . Alzheimer's disease Father    Social History  Substance Use Topics  . Smoking status: Never Smoker   . Smokeless tobacco: Never Used  . Alcohol Use: No   OB History    No data available     Review of Systems  Constitutional: Negative for fever and chills.  HENT: Negative for congestion.   Eyes: Negative for visual disturbance.  Respiratory: Negative for shortness of breath.   Cardiovascular: Negative for chest pain.  Gastrointestinal: Negative for vomiting and abdominal pain.  Genitourinary: Negative for dysuria and flank pain.  Musculoskeletal: Negative for back pain,  neck pain and neck stiffness.  Skin: Positive for wound. Negative for rash.  Neurological: Negative for light-headedness and headaches.      Allergies  Garlic and Penicillins  Home Medications   Prior to Admission medications   Medication Sig Start Date End Date Taking? Authorizing Provider  acetaminophen (TYLENOL) 500 MG tablet Take 1,000 mg by mouth every 6 (six) hours as needed for pain.     Historical Provider, MD  carvedilol (COREG) 12.5 MG tablet TAKE 1 & 1/2 TABLETS BY MOUTH TWICE A DAY 04/16/15   Rollene Rotunda, MD  clindamycin (CLEOCIN) 150 MG capsule Take 1 capsule (150 mg total) by mouth every 6 (six) hours. 12/28/15   Blane Ohara, MD  [facility] SENNA 8.6 MG tablet TAKE 1 TABLET BY MOUTH EVERY DAY 10/30/15   Rollene Rotunda, MD  digoxin (LANOXIN) 0.125 MG tablet Take 1 tablet (125 mcg total) by mouth daily. 01/24/15   Rollene Rotunda, MD  fluticasone [facility]) 50 MCG/ACT nasal spray USE 2 SPRAYS IN [facility] NOSTRIL DAILY 10/05/15   Rollene Rotunda, MD  losartan (COZAAR) 25 MG tablet Take 1 tablet (25 mg total) by mouth daily. 07/11/15   Rollene Rotunda, MD  losartan (COZAAR) 50 MG tablet TAKE 1 TABLET (50 MG TOTAL) BY MOUTH DAILY. 07/12/15   Rollene Rotunda, MD  naproxen sodium (ALEVE) 220 MG tablet Take 220 mg by mouth daily as needed (Alternates taking this medication and Tylenol only if needed for arthritis pain).    Historical Provider, MD  polyethylene glycol powder (GLYCOLAX/MIRALAX) powder Take 17 g by mouth daily as needed (for laxative/constipation). 11/19/15   Rollene RotundaJames Hochrein, MD  Saline (SIMPLY SALINE) 0.9 % AERS Place 1 spray into the nose daily as needed (for congestion).    Historical Provider, MD  traMADol (ULTRAM) 50 MG tablet Take 50 mg by mouth every 6 (six) hours as needed.    Historical Provider, MD  triamcinolone cream (KENALOG) 0.1 % Apply 1 application topically at bedtime as needed (for skin irritation). 02/14/13   Rollene RotundaJames Hochrein, MD  UNABLE TO FIND HOVEROUND/SCOOTER USE AS  DIRECTED #1 DX:CHF,ATRIAL Gabriel EaringFIBRILLATION,MITRAL REGURGITATION 01/03/14   Rollene RotundaJames Hochrein, MD  warfarin (COUMADIN) 5 MG tablet TAKE 1 TABLET BY MOUTH DAILY OR AS DIRECTED BY COUMADIN CLINIC 11/01/15   Rollene RotundaJames Hochrein, MD   BP 175/78 mmHg  Pulse 61  Temp(Src) 97.5 F (36.4 C) (Oral)  Resp 14  Ht 5\' 7"  (1.702 m)  Wt 175 lb (79.379 kg)  BMI 27.40 kg/m2  SpO2 100% Physical Exam  Constitutional: She is oriented to person, place, and time. She appears well-developed and well-nourished.  HENT:  Head: Normocephalic and atraumatic.  Eyes: Conjunctivae are normal. Right eye exhibits no discharge. Left eye exhibits no discharge.  Neck: Normal range of motion. Neck supple. No tracheal deviation present.  Cardiovascular: Normal rate and regular rhythm.   Pulmonary/Chest: Effort normal and breath sounds normal.  Abdominal: Soft. She exhibits no distension. There is no tenderness. There is no guarding.  Musculoskeletal: She exhibits edema.  Neurological: She is alert and oriented to person, place, and time.  Skin: Skin is warm. Rash noted.  Patient has approximate 3 cm diameter superficial ulcer mild bleeding and mild drainage. Very mild surrounding erythema no significant induration. No streaking up the leg. Warm foot distally.  Psychiatric: She has a normal mood and affect.  Nursing note and vitals reviewed.   ED Course  Procedures (including critical care time) Labs Review Labs Reviewed - No data to display  Imaging Review No results found. I have personally reviewed and evaluated these images and lab results as part of my medical decision-making.   EKG Interpretation None      MDM   Final diagnoses:  Leg ulcer, left, with unspecified severity (HCC)   Patient presents with poor healing wound likely combination of vein surgery history, heart failure, age, obesity.  Land for antibiotics and close monitoring of INR. I instructed the patient the importance of this with family in the room.  They understand her INR will fluctuate and they will have her INR checked early this week.  Results and differential diagnosis were discussed with the patient/parent/guardian. Xrays were independently reviewed by myself.  Close follow up outpatient was discussed, comfortable with the plan.   Medications  acetaminophen (TYLENOL) tablet 1,000 mg (not administered)    Filed Vitals:   12/28/15 1716 12/28/15 1939  BP: 168/79 175/78  Pulse: 55 61  Temp: 97.5 F (36.4 C)   TempSrc: Oral   Resp: 18 14  Height: 5\' 7"  (1.702 m)   Weight: 175 lb (79.379 kg)   SpO2: 99% 100%    Final diagnoses:  Leg ulcer, left, with unspecified severity (HCC)        Blane OharaJoshua Laurianne Floresca, MD 12/28/15 2011

## 2015-12-31 ENCOUNTER — Other Ambulatory Visit: Payer: Self-pay | Admitting: Cardiology

## 2016-01-01 ENCOUNTER — Ambulatory Visit (INDEPENDENT_AMBULATORY_CARE_PROVIDER_SITE_OTHER): Payer: Medicare Other | Admitting: *Deleted

## 2016-01-01 ENCOUNTER — Other Ambulatory Visit: Payer: Self-pay | Admitting: Pharmacist Clinician (PhC)/ Clinical Pharmacy Specialist

## 2016-01-01 DIAGNOSIS — I4891 Unspecified atrial fibrillation: Secondary | ICD-10-CM | POA: Diagnosis not present

## 2016-01-01 DIAGNOSIS — Z7901 Long term (current) use of anticoagulants: Secondary | ICD-10-CM

## 2016-01-01 DIAGNOSIS — Z5181 Encounter for therapeutic drug level monitoring: Secondary | ICD-10-CM

## 2016-01-01 LAB — POCT INR: INR: 3.2

## 2016-01-01 MED ORDER — WARFARIN SODIUM 5 MG PO TABS: ORAL_TABLET | ORAL | Status: DC

## 2016-01-01 MED ORDER — FLUTICASONE PROPIONATE 50 MCG/ACT NA SUSP: 2.0000 | Freq: Every day | NASAL | Status: DC

## 2016-01-01 NOTE — Telephone Encounter (Signed)
Rx sent to pharmacy with no refills, left note to pharmacy to defer to PCP.

## 2016-01-01 NOTE — Telephone Encounter (Signed)
Refill one time.  This should come from a PCP.

## 2016-01-03 ENCOUNTER — Other Ambulatory Visit: Payer: Self-pay | Admitting: Cardiology

## 2016-01-03 MED ORDER — DIGOXIN 125 MCG PO TABS: 125.0000 ug | ORAL_TABLET | Freq: Every day | ORAL | Status: DC

## 2016-01-03 NOTE — Telephone Encounter (Signed)
Rx request sent to pharmacy.  

## 2016-01-04 ENCOUNTER — Emergency Department (HOSPITAL_COMMUNITY)
Admission: EM | Admit: 2016-01-04 | Discharge: 2016-01-04 | Disposition: A | Discharge status: Home / Self Care | Payer: Medicare Other | Attending: Emergency Medicine | Admitting: Emergency Medicine

## 2016-01-04 ENCOUNTER — Encounter (HOSPITAL_COMMUNITY): Payer: Self-pay | Admitting: *Deleted

## 2016-01-04 DIAGNOSIS — I110 Hypertensive heart disease with heart failure: Secondary | ICD-10-CM | POA: Diagnosis not present

## 2016-01-04 DIAGNOSIS — I509 Heart failure, unspecified: Secondary | ICD-10-CM | POA: Insufficient documentation

## 2016-01-04 DIAGNOSIS — R2242 Localized swelling, mass and lump, left lower limb: Secondary | ICD-10-CM | POA: Diagnosis present

## 2016-01-04 DIAGNOSIS — Z79899 Other long term (current) drug therapy: Secondary | ICD-10-CM | POA: Insufficient documentation

## 2016-01-04 DIAGNOSIS — L97929 Non-pressure chronic ulcer of unspecified part of left lower leg with unspecified severity: Secondary | ICD-10-CM | POA: Insufficient documentation

## 2016-01-04 DIAGNOSIS — L97909 Non-pressure chronic ulcer of unspecified part of unspecified lower leg with unspecified severity: Secondary | ICD-10-CM | POA: Diagnosis not present

## 2016-01-04 MED ORDER — CLINDAMYCIN HCL 300 MG PO CAPS: 300.0000 mg | ORAL_CAPSULE | Freq: Four times a day (QID) | ORAL | Status: DC

## 2016-01-04 NOTE — ED Notes (Signed)
Pt made aware to return if symptoms worsen or if any life threatening symptoms occur.   

## 2016-01-04 NOTE — ED Notes (Signed)
Dressing placed

## 2016-01-04 NOTE — ED Notes (Signed)
Pt presents with left lower leg wound, seen here Saturday and prescribed antibiotic. Does not have a PCP yet and wants leg checked, states it is looking better.

## 2016-01-05 NOTE — ED Provider Notes (Signed)
CSN: [id number]     Arrival date & time 01/04/16  0902 History   First MD Initiated Contact with Patient 01/04/16 [phone]     Chief Complaint  Patient presents with  . Leg Swelling     (Consider location/radiation/quality/duration/timing/severity/associated sxs/prior Treatment) HPI  [name] is a 82 y.o. female who presents to the Emergency Department for recheck of a ulceration to her left lower leg.  She was seen here on 12/28/15 and started on clindamycin.  She states she does not currently have a PMD and wanted her leg rechecked.  She states that symptoms are improving and pain is minimal now.  She has been elevating her leg and keeping the wound bandaged with a wet to dry dressing.  She denies increased swelling, numbness, fever, chills, or vomiting.    Past Medical History  Diagnosis Date  . CHF (congestive heart failure) (HCC)     EF  30%  Echo 01/17/11,  EF 50% in 2014  . Hypertension   . Atrial fibrillation (HCC)   . Hernia   . Hyposmolality and/or hyponatremia   . Esophageal reflux   . Chronic airway obstruction, not elsewhere classified   . Morbid obesity (HCC)   . Osteoarthrosis, unspecified whether generalized or localized, unspecified site   . Mitral regurgitation   . Moderate tricuspid regurgitation by prior echocardiogram    Past Surgical History  Procedure Laterality Date  . Abdominal hysterectomy    . Right lower extremity surgery     Family History  Problem Relation Age of Onset  . Heart attack Mother   . Alzheimer's disease Father    Social History  Substance Use Topics  . Smoking status: Never Smoker   . Smokeless tobacco: Never Used  . Alcohol Use: No   OB History    No data available     Review of Systems  Constitutional: Negative for fever and chills.  Gastrointestinal: Negative for nausea and vomiting.  Genitourinary: Negative for dysuria and difficulty urinating.  Musculoskeletal: Negative for joint swelling and arthralgias.  Skin:  Positive for color change and wound.  Neurological: Negative for dizziness, weakness and numbness.  All other systems reviewed and are negative.     Allergies  Garlic and Penicillins  Home Medications   Prior to Admission medications   Medication Sig Start Date End Date Taking? Authorizing Provider  acetaminophen (TYLENOL) 500 MG tablet Take 1,000 mg by mouth every 6 (six) hours as needed for pain.    Yes Historical Provider, MD  carvedilol (COREG) 12.5 MG tablet TAKE 1 & 1/2 TABLETS BY MOUTH TWICE A DAY 04/16/15  Yes Rollene RotundaJames Hochrein, MD  clindamycin (CLEOCIN) 150 MG capsule Take 1 capsule (150 mg total) by mouth every 6 (six) hours. 12/28/15  Yes Blane OharaJoshua Zavitz, MD  [facility] SENNA 8.6 MG tablet TAKE 1 TABLET BY MOUTH EVERY DAY 10/30/15  Yes Rollene RotundaJames Hochrein, MD  digoxin (LANOXIN) 0.125 MG tablet Take 1 tablet (125 mcg total) by mouth daily. 01/03/16  Yes Rollene RotundaJames Hochrein, MD  fluticasone (FLONASE) 50 MCG/ACT nasal spray Place 2 sprays into both nostrils daily. 01/01/16  Yes Rollene RotundaJames Hochrein, MD  losartan (COZAAR) 25 MG tablet Take 1 tablet (25 mg total) by mouth daily. 07/11/15  Yes Rollene RotundaJames Hochrein, MD  losartan (COZAAR) 50 MG tablet TAKE 1 TABLET (50 MG TOTAL) BY MOUTH DAILY. 07/12/15  Yes Rollene RotundaJames Hochrein, MD  naproxen sodium (ALEVE) 220 MG tablet Take 220 mg by mouth daily as needed (Alternates taking this medication and Tylenol  only if needed for arthritis pain).   Yes Historical Provider, MD  polyethylene glycol powder (GLYCOLAX/MIRALAX) powder Take 17 g by mouth daily as needed (for laxative/constipation). 11/19/15  Yes Rollene Rotunda, MD  Saline (SIMPLY SALINE) 0.9 % AERS Place 1 spray into the nose daily as needed (for congestion).   Yes Historical Provider, MD  traMADol (ULTRAM) 50 MG tablet Take 50 mg by mouth every 6 (six) hours as needed.   Yes Historical Provider, MD  triamcinolone cream (KENALOG) 0.1 % Apply 1 application topically at bedtime as needed (for skin irritation). 02/14/13  Yes Rollene Rotunda, MD  UNABLE TO FIND HOVEROUND/SCOOTER USE AS DIRECTED #1 DX:CHF,ATRIAL Gabriel Earing REGURGITATION 01/03/14  Yes Rollene Rotunda, MD  warfarin (COUMADIN) 5 MG tablet TAKE 1 TABLET BY MOUTH DAILY OR AS DIRECTED BY COUMADIN CLINIC 01/01/16  Yes Rollene Rotunda, MD  clindamycin (CLEOCIN) 300 MG capsule Take 1 capsule (300 mg total) by mouth 4 (four) times daily. 01/04/16   Shannyn Jankowiak, PA-C   BP 131/72 mmHg  Pulse 77  Temp(Src) 97.8 F (36.6 C) (Oral)  Resp 16  Ht  (1.702 m)  Wt 76.658 kg  BMI 26.46 kg/m2  SpO2 95% Physical Exam  Constitutional: She is oriented to person, place, and time. She appears well-developed and well-nourished. No distress.  HENT:  Head: Normocephalic.  Cardiovascular: Normal rate and intact distal pulses.   No murmur heard. Pulmonary/Chest: Effort normal and breath sounds normal. No respiratory distress.  Musculoskeletal:  Moderate edema of the dorsal left foot with mild erythema of the foot and anterior lower leg.  compartments soft  Neurological: She is alert and oriented to person, place, and time.  Skin:  4 cm superficial ulceration to the posterior left lower leg.  Wound edges appear clean.  No significant surrounding erythema.  No drainage or induration  Psychiatric: She has a normal mood and affect. Thought content normal.  Nursing note and vitals reviewed.   ED Course  Procedures (including critical care time) Labs Review Labs Reviewed - No data to display  Imaging Review No results found. I have personally reviewed and evaluated these images and lab results as part of my medical decision-making.   EKG Interpretation None      MDM   Final diagnoses:  Leg ulcer, left, with unspecified severity (HCC)    Pt is well appearing.  Vitals stable.  She reports improvement of previous sx's.  Remains NV intact. DP and PT pulses heard with portable doppler, toes warm and sensation intact     Pt has moderate edema of the dorsal  foot and mild erythema.  No surrounding erythema of the ulceration.  Pt reports this as a chronic issue, but I am concerned for failing outpatient antibiotic therapy.  I have offered admission, but pt prefers continued antibiotic therapy, wet to dry dressings and another recheck here on Sunday.  Pt was initially written for 150 mg clindamycin for 7 days and I will increase dosage to 300 mg for 3 more days.  Appears stable for d/c and agrees to tx plan.      Pauline Aus, PA-C 01/05/16 2039  Samuel Jester, DO 01/09/16 1743

## 2016-01-06 ENCOUNTER — Emergency Department (HOSPITAL_COMMUNITY)
Admission: EM | Admit: 2016-01-06 | Discharge: 2016-01-06 | Disposition: A | Discharge status: Home / Self Care | Payer: Medicare Other | Attending: Emergency Medicine | Admitting: Emergency Medicine

## 2016-01-06 ENCOUNTER — Encounter (HOSPITAL_COMMUNITY): Payer: Self-pay | Admitting: Emergency Medicine

## 2016-01-06 DIAGNOSIS — M1990 Unspecified osteoarthritis, unspecified site: Secondary | ICD-10-CM | POA: Insufficient documentation

## 2016-01-06 DIAGNOSIS — Z4801 Encounter for change or removal of surgical wound dressing: Secondary | ICD-10-CM | POA: Diagnosis not present

## 2016-01-06 DIAGNOSIS — Z4800 Encounter for change or removal of nonsurgical wound dressing: Secondary | ICD-10-CM | POA: Diagnosis not present

## 2016-01-06 DIAGNOSIS — I110 Hypertensive heart disease with heart failure: Secondary | ICD-10-CM | POA: Insufficient documentation

## 2016-01-06 DIAGNOSIS — Z5189 Encounter for other specified aftercare: Secondary | ICD-10-CM

## 2016-01-06 DIAGNOSIS — I509 Heart failure, unspecified: Secondary | ICD-10-CM | POA: Insufficient documentation

## 2016-01-06 MED ORDER — ACETAMINOPHEN 325 MG PO TABS
650.0000 mg | ORAL_TABLET | Freq: Once | ORAL | Status: AC
  Administered 2016-01-06: 650 mg via ORAL

## 2016-01-06 MED ORDER — BACITRACIN ZINC 500 UNIT/GM EX OINT: 1.0000 "application " | TOPICAL_OINTMENT | Freq: Two times a day (BID) | CUTANEOUS | Status: DC

## 2016-01-06 MED ORDER — BACITRACIN ZINC 500 UNIT/GM EX OINT
1.0000 "application " | TOPICAL_OINTMENT | Freq: Two times a day (BID) | CUTANEOUS | Status: DC
  Administered 2016-01-06: 1 via TOPICAL
  Filled 2016-01-06: qty 0.9

## 2016-01-06 MED ORDER — ACETAMINOPHEN 325 MG PO TABS
ORAL_TABLET | ORAL | Status: AC
  Administered 2016-01-06: 650 mg via ORAL
  Filled 2016-01-06: qty 2

## 2016-01-06 NOTE — ED Notes (Signed)
Patient requesting tylenol prior to leaving. Given with MD permission. Dressing applied to wound with bacitracin. Patient with no complaints at this time. Respirations even and unlabored. Skin warm/dry. Discharge instructions reviewed with patient at this time. Patient given opportunity to voice concerns/ask questions. Patient discharged at this time and left Emergency Department via wheelchair.

## 2016-01-06 NOTE — Discharge Instructions (Signed)
[facility] Doctor List ° ° ° °Edward Hawkins MD. Specialty: Pulmonary Disease Contact information: [address]  °[address]  °[place] [address]  °[phone]  ° °Margaret Simpson, MD. Specialty: Family Medicine Contact information: [address]  °[place] [address]  °[phone]  ° °Scott Luking, MD. Specialty: Family Medicine Contact information: [address]  °[address]  °[place] [address]  °[phone]  ° °Tesfaye Fanta, MD Specialty: Internal Medicine Contact information: [address]  °[place] [address]  °[phone]  ° °Zach Hall, MD. Specialty: Internal Medicine Contact information: [address]  °[place] [address]  °[phone]  ° °Angus Mcinnis, MD. Specialty: Family Medicine Contact information: [address]  °[place] [address]  °[phone]  ° °Stephen Knowlton, MD. Specialty: Family Medicine Contact information: [address]  °[address]  °[place] [address]  °[phone]  ° °Roy Fagan, MD. Specialty: Internal Medicine Contact information: [address]  °[address]  ° [place] [address]  °[phone]  ° ° °

## 2016-01-06 NOTE — ED Provider Notes (Signed)
CSN: [id number]     Arrival date & time 01/06/16  0935 History  By signing my name below, I, [name], attest that this documentation has been prepared under the direction and in the presence of Melanie Hong, MD. Electronically Signed: Ronney Wilkins, ED Scribe. 01/06/2016. 10:33 AM   Chief Complaint  Patient presents with  . Wound Check   The history is provided by the patient. No language interpreter was used.    HPI Comments: [name] is a 82 y.o. female with a history of atrial fibrillation (on Coumadin) who presents to the Emergency Department for a wound check of a sudden-onset, constant, gradaully improving wound on her leg lower leg that onset about 3-4 weeks ago. She states she had pulled off a piece of dry skin "and a hunk of meat came with it." Patient had been seen by Pauline Aus, PA-C, on 01/04/16 and Dr. Blane Ohara on 12/28/15 for the same, and was prescribed clindamycin both visits, and she has taken it constantly for the past week. Patient states she had been elevating her leg. She denies any pain radiation to her foot, swelling, pain, or fevers. Patient states she lives in [place] and is currently looking to establish PCP care.    Past Medical History  Diagnosis Date  . CHF (congestive heart failure) (HCC)     EF  30%  Echo 01/17/11,  EF 50% in 2014  . Hypertension   . Atrial fibrillation (HCC)   . Hernia   . Hyposmolality and/or hyponatremia   . Esophageal reflux   . Chronic airway obstruction, not elsewhere classified   . Morbid obesity (HCC)   . Osteoarthrosis, unspecified whether generalized or localized, unspecified site   . Mitral regurgitation   . Moderate tricuspid regurgitation by prior echocardiogram    Past Surgical History  Procedure Laterality Date  . Abdominal hysterectomy    . Right lower extremity surgery     Family History  Problem Relation Age of Onset  . Heart attack Mother   . Alzheimer's disease Father    Social History  Substance  Use Topics  . Smoking status: Never Smoker   . Smokeless tobacco: Never Used  . Alcohol Use: No   OB History    No data available     Review of Systems  Constitutional: Negative for fever.  Cardiovascular: Negative for leg swelling.  Musculoskeletal: Negative for myalgias.  Skin: Positive for wound (healing).  All other systems reviewed and are negative.     Allergies  Garlic and Penicillins  Home Medications   Prior to Admission medications   Medication Sig Start Date End Date Taking? Authorizing Provider  acetaminophen (TYLENOL) 500 MG tablet Take 1,000 mg by mouth every 6 (six) hours as needed for pain.     Historical Provider, MD  bacitracin ointment Apply 1 application topically 2 (two) times daily. 01/06/16   Melanie Hong, MD  carvedilol (COREG) 12.5 MG tablet TAKE 1 & 1/2 TABLETS BY MOUTH TWICE A DAY 04/16/15   Rollene Rotunda, MD  clindamycin (CLEOCIN) 150 MG capsule Take 1 capsule (150 mg total) by mouth every 6 (six) hours. 12/28/15   Blane Ohara, MD  clindamycin (CLEOCIN) 300 MG capsule Take 1 capsule (300 mg total) by mouth 4 (four) times daily. 01/04/16   Tammy Triplett, PA-C  [facility] SENNA 8.6 MG tablet TAKE 1 TABLET BY MOUTH EVERY DAY 10/30/15   Rollene Rotunda, MD  digoxin (LANOXIN) 0.125 MG tablet Take 1 tablet (125 mcg total)  by mouth daily. 01/03/16   Rollene RotundaJames Hochrein, MD  fluticasone (FLONASE) 50 MCG/ACT nasal spray Place 2 sprays into both nostrils daily. 01/01/16   Rollene RotundaJames Hochrein, MD  losartan (COZAAR) 25 MG tablet Take 1 tablet (25 mg total) by mouth daily. 07/11/15   Rollene RotundaJames Hochrein, MD  losartan (COZAAR) 50 MG tablet TAKE 1 TABLET (50 MG TOTAL) BY MOUTH DAILY. 07/12/15   Rollene RotundaJames Hochrein, MD  naproxen sodium (ALEVE) 220 MG tablet Take 220 mg by mouth daily as needed (Alternates taking this medication and Tylenol only if needed for arthritis pain).    Historical Provider, MD  polyethylene glycol powder (GLYCOLAX/MIRALAX) powder Take 17 g by mouth daily as needed (for  laxative/constipation). 11/19/15   Rollene RotundaJames Hochrein, MD  Saline (SIMPLY SALINE) 0.9 % AERS Place 1 spray into the nose daily as needed (for congestion).    Historical Provider, MD  traMADol (ULTRAM) 50 MG tablet Take 50 mg by mouth every 6 (six) hours as needed.    Historical Provider, MD  triamcinolone cream (KENALOG) 0.1 % Apply 1 application topically at bedtime as needed (for skin irritation). 02/14/13   Rollene RotundaJames Hochrein, MD  UNABLE TO FIND HOVEROUND/SCOOTER USE AS DIRECTED #1 DX:CHF,ATRIAL Gabriel EaringFIBRILLATION,MITRAL REGURGITATION 01/03/14   Rollene RotundaJames Hochrein, MD  warfarin (COUMADIN) 5 MG tablet TAKE 1 TABLET BY MOUTH DAILY OR AS DIRECTED BY COUMADIN CLINIC 01/01/16   Rollene RotundaJames Hochrein, MD   BP 156/108 mmHg  Pulse 89  Temp(Src) 97.8 F (36.6 C) (Oral)  Resp 18  Ht 5\' 7"  (1.702 m)  Wt 160 lb (72.576 kg)  BMI 25.05 kg/m2  SpO2 96% Physical Exam  Constitutional: She appears well-developed and well-nourished.  HENT:  Head: Normocephalic and atraumatic.  Eyes: Conjunctivae are normal. Right eye exhibits no discharge. Left eye exhibits no discharge.  Pulmonary/Chest: Effort normal. No respiratory distress.  Neurological: She is alert. Coordination normal.  Skin: Skin is warm and dry. No rash noted. She is not diaphoretic. No erythema.  3 cm by 2 cm oval shape, open lesion on posterior left lower leg, no surrounding erythema or induration. There is a healing fibrinoid exudate over the surface.    Psychiatric: She has a normal mood and affect.  Nursing note and vitals reviewed.   ED Course  Procedures (including critical care time)  DIAGNOSTIC STUDIES: Oxygen Saturation is 96% on RA, normal by my interpretation.    COORDINATION OF CARE: 9:55 AM - Discussed treatment plan with pt at bedside which includes continuing keeping wound covered with a clean dressing. Wound care discussed. Pt verbalized understanding and agreed to plan. Pt and family had opportunity to ask questions and has no further  questions at this time.   MDM   Final diagnoses:  Visit for wound check    I personally performed the services described in this documentation, which was scribed in my presence. The recorded information has been reviewed and is accurate.   The wound is clean and in good healing condition without signs of infection - her VS are reassuring - stable for d/c.  Wound care provided prior to d/c.  She does not have a PCP - she will initiate the search for one.  Melanie HongBrian Ladon Vandenberghe, MD 01/07/16 [phone]

## 2016-01-06 NOTE — ED Notes (Signed)
Patient with wound on posterior calf. Antibiotic therapy and daily dressing changes done. Here for re-eval.

## 2016-01-29 ENCOUNTER — Ambulatory Visit (INDEPENDENT_AMBULATORY_CARE_PROVIDER_SITE_OTHER): Payer: Medicare Other | Admitting: *Deleted

## 2016-01-29 DIAGNOSIS — Z7901 Long term (current) use of anticoagulants: Secondary | ICD-10-CM

## 2016-01-29 DIAGNOSIS — I4891 Unspecified atrial fibrillation: Secondary | ICD-10-CM | POA: Diagnosis not present

## 2016-01-29 DIAGNOSIS — Z5181 Encounter for therapeutic drug level monitoring: Secondary | ICD-10-CM | POA: Diagnosis not present

## 2016-01-29 LAB — POCT INR: INR: 2.3

## 2016-02-15 ENCOUNTER — Other Ambulatory Visit: Payer: Self-pay | Admitting: Cardiology

## 2016-03-04 ENCOUNTER — Ambulatory Visit (INDEPENDENT_AMBULATORY_CARE_PROVIDER_SITE_OTHER): Payer: Medicare Other | Admitting: *Deleted

## 2016-03-04 DIAGNOSIS — Z5181 Encounter for therapeutic drug level monitoring: Secondary | ICD-10-CM | POA: Diagnosis not present

## 2016-03-04 DIAGNOSIS — Z7901 Long term (current) use of anticoagulants: Secondary | ICD-10-CM | POA: Diagnosis not present

## 2016-03-04 DIAGNOSIS — I4891 Unspecified atrial fibrillation: Secondary | ICD-10-CM

## 2016-03-04 LAB — POCT INR: INR: 2.3

## 2016-03-28 ENCOUNTER — Other Ambulatory Visit: Payer: Self-pay | Admitting: Cardiology

## 2016-04-10 ENCOUNTER — Ambulatory Visit (INDEPENDENT_AMBULATORY_CARE_PROVIDER_SITE_OTHER): Payer: Medicare Other | Admitting: *Deleted

## 2016-04-10 DIAGNOSIS — Z7901 Long term (current) use of anticoagulants: Secondary | ICD-10-CM | POA: Diagnosis not present

## 2016-04-10 DIAGNOSIS — I4891 Unspecified atrial fibrillation: Secondary | ICD-10-CM | POA: Diagnosis not present

## 2016-04-10 DIAGNOSIS — Z5181 Encounter for therapeutic drug level monitoring: Secondary | ICD-10-CM | POA: Diagnosis not present

## 2016-04-10 LAB — POCT INR: INR: 2.6

## 2016-04-24 ENCOUNTER — Other Ambulatory Visit: Payer: Self-pay | Admitting: Cardiology

## 2016-04-24 NOTE — Telephone Encounter (Signed)
Rx(s) sent to pharmacy electronically.  

## 2016-06-03 ENCOUNTER — Ambulatory Visit (INDEPENDENT_AMBULATORY_CARE_PROVIDER_SITE_OTHER): Payer: Medicare Other | Admitting: *Deleted

## 2016-06-03 DIAGNOSIS — Z5181 Encounter for therapeutic drug level monitoring: Secondary | ICD-10-CM

## 2016-06-03 DIAGNOSIS — I4891 Unspecified atrial fibrillation: Secondary | ICD-10-CM | POA: Diagnosis not present

## 2016-06-03 DIAGNOSIS — Z7901 Long term (current) use of anticoagulants: Secondary | ICD-10-CM | POA: Diagnosis not present

## 2016-06-03 LAB — POCT INR: INR: 3.2

## 2016-06-17 ENCOUNTER — Encounter: Payer: Self-pay | Admitting: Cardiology

## 2016-06-24 ENCOUNTER — Ambulatory Visit (INDEPENDENT_AMBULATORY_CARE_PROVIDER_SITE_OTHER): Payer: Medicare Other | Admitting: *Deleted

## 2016-06-24 DIAGNOSIS — I4891 Unspecified atrial fibrillation: Secondary | ICD-10-CM

## 2016-06-24 DIAGNOSIS — Z5181 Encounter for therapeutic drug level monitoring: Secondary | ICD-10-CM | POA: Diagnosis not present

## 2016-06-24 DIAGNOSIS — Z7901 Long term (current) use of anticoagulants: Secondary | ICD-10-CM

## 2016-06-24 LAB — POCT INR: INR: 3.3

## 2016-06-25 ENCOUNTER — Other Ambulatory Visit: Payer: Self-pay | Admitting: Cardiology

## 2016-07-01 NOTE — Progress Notes (Signed)
HPI The patient presents for follow up of her atrial fibrillation and cardiomyopathy.  Since I last saw her she has had follow up for a chronic leg ulcer.  She did have an echo in March of this year and had normal LV function with mild MR/AI.  There was mild LVH.  Since I last saw her she's done relatively well though her mobility is down.  She gets around with a rolling walker in her home but has limited mobility that keeps her from going out and doing as much she would like..   She denies any new cardiovascular symptoms.  She denies any palpitations, presyncope or syncope. She denies any chest pressure, neck or arm discomfort. She doesn't have any new shortness of breath, PND or orthopnea.  She has had no new edema.   Allergies  Allergen Reactions  . Garlic Rash  . Penicillins     Current Outpatient Prescriptions  Medication Sig Dispense Refill  . acetaminophen (TYLENOL) 500 MG tablet Take 1,000 mg by mouth every 6 (six) hours as needed for pain.     . bacitracin ointment Apply 1 application topically 2 (two) times daily. 120 g prn  . carvedilol (COREG) 12.5 MG tablet TAKE 1 & 1/2 TABLETS BY MOUTH TWICE A DAY 270 tablet 1  . clindamycin (CLEOCIN) 150 MG capsule Take 1 capsule (150 mg total) by mouth every 6 (six) hours. 28 capsule 0  . clindamycin (CLEOCIN) 300 MG capsule Take 1 capsule (300 mg total) by mouth 4 (four) times daily. 12 capsule 0  . [facility] SENNA 8.6 MG tablet TAKE 1 TABLET BY MOUTH EVERY DAY 30 tablet 8  . digoxin (LANOXIN) 0.125 MG tablet Take 1 tablet (125 mcg total) by mouth daily. 90 tablet 1  . fluticasone (FLONASE) 50 MCG/ACT nasal spray Place 2 sprays into both nostrils daily. 16 g prn  . losartan (COZAAR) 25 MG tablet Take 1 tablet (25 mg total) by mouth daily. 90 tablet 3  . losartan (COZAAR) 50 MG tablet TAKE 1 TABLET (50 MG TOTAL) BY MOUTH DAILY. 90 tablet 3  . naproxen sodium (ALEVE) 220 MG tablet Take 220 mg by mouth daily as needed (Alternates taking this  medication and Tylenol only if needed for arthritis pain).    . polyethylene glycol powder (GLYCOLAX/MIRALAX) powder Take 17 g by mouth daily as needed (for laxative/constipation). 527 g 3  . Saline (SIMPLY SALINE) 0.9 % AERS Place 1 spray into the nose daily as needed (for congestion).    . traMADol (ULTRAM) 50 MG tablet Take 50 mg by mouth every 6 (six) hours as needed.    . triamcinolone cream (KENALOG) 0.1 % Apply 1 application topically at bedtime as needed (for skin irritation).    Marland Kitchen UNABLE TO FIND HOVEROUND/SCOOTER USE AS DIRECTED #1 DX:CHF,ATRIAL FIBRILLATION,MITRAL REGURGITATION 1 each 0  . warfarin (COUMADIN) 5 MG tablet TAKE 1 TABLET BY MOUTH DAILY AS DIRECTED 90 tablet 0   No current facility-administered medications for this visit.     Past Medical History:  Diagnosis Date  . Atrial fibrillation (HCC)   . CHF (congestive heart failure) (HCC)    EF  30%  Echo 01/17/11,  EF 50% in 2014  . Chronic airway obstruction, not elsewhere classified   . Esophageal reflux   . Hernia   . Hypertension   . Hyposmolality and/or hyponatremia   . Mitral regurgitation   . Moderate tricuspid regurgitation by prior echocardiogram   . Morbid obesity (HCC)   .  Osteoarthrosis, unspecified whether generalized or localized, unspecified site     Past Surgical History:  Procedure Laterality Date  . ABDOMINAL HYSTERECTOMY    . Right lower extremity surgery     ROS:   Decreased mobility and vision. Otherwise as stated in the HPI and negative for all other systems  PHYSICAL EXAM BP (!) 176/80   Pulse (!) 54   Ht 5\' 7"  (1.702 m)   Wt 188 lb (85.3 kg)   BMI 29.44 kg/m  GEN: No distress  NECK: No jugular venous distention at 90 degrees, waveform within normal limits, carotid upstroke brisk and symmetric, no bruits, no thyromegaly  LYMPHATICS: No cervical adenopathy  LUNGS: Clear to auscultation bilaterally  BACK: No CVA tenderness  CHEST: Unremarkable  HEART: S1 and S2 within normal  limits, no S3, no clicks, no rubs, no murmurs, irregular ABD: Positive bowel sounds normal in frequency in pitch, no bruits, no rebound, no guarding, unable to assess midline mass or bruit with the patient seated.  EXT: 2 plus pulses throughout, trace edema , no cyanosis no clubbing   EKG:  Atrial fibrillation, rate 64, right bundle branch block, QTC prolonged.. 07/02/2016   ASSESSMENT AND PLAN  Atrial fibrillation -  She does not notice that she's in this rhythm. She tolerates anticoagulation. She will continue the meds as listed.  Ms. Glenford PeersDorothy Mulvihill has a CHA2DS2 - VASc score of 4 with a risk of stroke of 4%.  I will check a CBC.   Hypertension -  Her blood pressures elevated here but we talked about she does have a wrist machine and I talked to her caregiver who is her family member about keeping a look at the automated diary and I gave her some goals. I don't think she needs may change this point.

## 2016-07-02 ENCOUNTER — Other Ambulatory Visit: Payer: Self-pay | Admitting: Cardiology

## 2016-07-02 ENCOUNTER — Ambulatory Visit (INDEPENDENT_AMBULATORY_CARE_PROVIDER_SITE_OTHER): Payer: Medicare Other | Admitting: Cardiology

## 2016-07-02 ENCOUNTER — Other Ambulatory Visit: Payer: Self-pay | Admitting: *Deleted

## 2016-07-02 ENCOUNTER — Encounter: Payer: Self-pay | Admitting: Cardiology

## 2016-07-02 ENCOUNTER — Other Ambulatory Visit: Payer: Medicare Other

## 2016-07-02 VITALS — BP 176/80 | HR 54 | Ht 67.0 in | Wt 188.0 lb

## 2016-07-02 DIAGNOSIS — I482 Chronic atrial fibrillation, unspecified: Secondary | ICD-10-CM

## 2016-07-02 DIAGNOSIS — Z79899 Other long term (current) drug therapy: Secondary | ICD-10-CM

## 2016-07-02 DIAGNOSIS — I10 Essential (primary) hypertension: Secondary | ICD-10-CM | POA: Diagnosis not present

## 2016-07-02 MED ORDER — FLUTICASONE PROPIONATE 50 MCG/ACT NA SUSP: 2.0000 | Freq: Every day | NASAL | 99 refills | Status: DC

## 2016-07-02 MED ORDER — TRIAMCINOLONE ACETONIDE 0.1 % EX CREA: 1.0000 "application " | TOPICAL_CREAM | Freq: Every evening | CUTANEOUS | 99 refills | Status: DC | PRN

## 2016-07-02 MED ORDER — BACITRACIN ZINC 500 UNIT/GM EX OINT: 1.0000 "application " | TOPICAL_OINTMENT | Freq: Two times a day (BID) | CUTANEOUS | 99 refills | Status: DC

## 2016-07-02 NOTE — Patient Instructions (Signed)
Medication Instructions:  The current medical regimen is effective;  continue present plan and medications.  Labwork: Please have blood work today (CBC, BMP) at [facility].  Follow-Up: Follow up in 6 months with Dr. Antoine [place]Hochrein in [place].  You will receive a letter in the mail 2 months before you are due.  Please call us when you receive this letter to schedule your follow up appointment.  If you need a refill on your cardiac medications before your next appointment, please call your pharmacy.  Thank you for choosing [facility]!!

## 2016-07-03 LAB — CBC
HEMATOCRIT: 39.8 % (ref 34.0–46.6)
Hemoglobin: 13.4 g/dL (ref 11.1–15.9)
MCH: 31.8 pg (ref 26.6–33.0)
MCHC: 33.7 g/dL (ref 31.5–35.7)
MCV: 94 fL (ref 79–97)
PLATELETS: 253 10*3/uL (ref 150–379)
RBC: 4.22 x10E6/uL (ref 3.77–5.28)
RDW: 13.7 % (ref 12.3–15.4)
WBC: 7.5 10*3/uL (ref 3.4–10.8)

## 2016-07-03 LAB — BASIC METABOLIC PANEL
BUN/Creatinine Ratio: 27 (ref 12–28)
BUN: 18 mg/dL (ref 8–27)
CALCIUM: 9.3 mg/dL (ref 8.7–10.3)
CHLORIDE: 97 mmol/L (ref 96–106)
CO2: 22 mmol/L (ref 18–29)
Creatinine, Ser: 0.66 mg/dL (ref 0.57–1.00)
GFR calc non Af Amer: 83 mL/min/{1.73_m2} (ref >59)
GFR, EST AFRICAN AMERICAN: 95 mL/min/{1.73_m2} (ref >59)
Glucose: 121 mg/dL — ABNORMAL HIGH (ref 65–99)
POTASSIUM: 4.6 mmol/L (ref 3.5–5.2)
Sodium: 137 mmol/L (ref 134–144)

## 2016-07-09 ENCOUNTER — Other Ambulatory Visit: Payer: Self-pay | Admitting: Cardiology

## 2016-07-09 NOTE — Telephone Encounter (Signed)
Rx(s) sent to pharmacy electronically.  

## 2016-07-16 ENCOUNTER — Other Ambulatory Visit: Payer: Self-pay | Admitting: Cardiology

## 2016-07-16 NOTE — Telephone Encounter (Signed)
Rx(s) sent to pharmacy electronically.  

## 2016-07-18 ENCOUNTER — Other Ambulatory Visit: Payer: Self-pay

## 2016-07-18 MED ORDER — FLUTICASONE PROPIONATE 50 MCG/ACT NA SUSP: 2.0000 | Freq: Every day | NASAL | 99 refills | Status: DC

## 2016-07-24 ENCOUNTER — Ambulatory Visit (INDEPENDENT_AMBULATORY_CARE_PROVIDER_SITE_OTHER): Payer: Medicare Other | Admitting: *Deleted

## 2016-07-24 DIAGNOSIS — I4891 Unspecified atrial fibrillation: Secondary | ICD-10-CM

## 2016-07-24 DIAGNOSIS — Z5181 Encounter for therapeutic drug level monitoring: Secondary | ICD-10-CM | POA: Diagnosis not present

## 2016-07-24 DIAGNOSIS — Z7901 Long term (current) use of anticoagulants: Secondary | ICD-10-CM | POA: Diagnosis not present

## 2016-07-24 LAB — POCT INR: INR: 2.7

## 2016-08-17 ENCOUNTER — Other Ambulatory Visit: Payer: Self-pay | Admitting: Cardiology

## 2016-09-09 ENCOUNTER — Ambulatory Visit (INDEPENDENT_AMBULATORY_CARE_PROVIDER_SITE_OTHER): Payer: Medicare Other | Admitting: *Deleted

## 2016-09-09 DIAGNOSIS — Z7901 Long term (current) use of anticoagulants: Secondary | ICD-10-CM | POA: Diagnosis not present

## 2016-09-09 DIAGNOSIS — Z5181 Encounter for therapeutic drug level monitoring: Secondary | ICD-10-CM

## 2016-09-09 DIAGNOSIS — I4891 Unspecified atrial fibrillation: Secondary | ICD-10-CM

## 2016-09-09 LAB — POCT INR: INR: 2.6

## 2016-09-20 ENCOUNTER — Other Ambulatory Visit: Payer: Self-pay | Admitting: Cardiology

## 2016-10-02 ENCOUNTER — Other Ambulatory Visit: Payer: Self-pay | Admitting: Cardiology

## 2016-10-02 NOTE — Telephone Encounter (Signed)
Rx(s) sent to pharmacy electronically.  

## 2016-10-16 ENCOUNTER — Ambulatory Visit (INDEPENDENT_AMBULATORY_CARE_PROVIDER_SITE_OTHER): Payer: Medicare Other | Admitting: *Deleted

## 2016-10-16 DIAGNOSIS — Z7901 Long term (current) use of anticoagulants: Secondary | ICD-10-CM

## 2016-10-16 DIAGNOSIS — Z5181 Encounter for therapeutic drug level monitoring: Secondary | ICD-10-CM | POA: Diagnosis not present

## 2016-10-16 DIAGNOSIS — I4891 Unspecified atrial fibrillation: Secondary | ICD-10-CM

## 2016-10-16 LAB — POCT INR: INR: 2

## 2016-11-16 ENCOUNTER — Other Ambulatory Visit: Payer: Self-pay | Admitting: Cardiology

## 2016-12-04 ENCOUNTER — Ambulatory Visit (INDEPENDENT_AMBULATORY_CARE_PROVIDER_SITE_OTHER): Payer: Medicare Other | Admitting: *Deleted

## 2016-12-04 DIAGNOSIS — Z7901 Long term (current) use of anticoagulants: Secondary | ICD-10-CM | POA: Diagnosis not present

## 2016-12-04 DIAGNOSIS — Z5181 Encounter for therapeutic drug level monitoring: Secondary | ICD-10-CM | POA: Diagnosis not present

## 2016-12-04 DIAGNOSIS — I4891 Unspecified atrial fibrillation: Secondary | ICD-10-CM | POA: Diagnosis not present

## 2016-12-04 LAB — POCT INR: INR: 2.5

## 2016-12-19 ENCOUNTER — Other Ambulatory Visit: Payer: Self-pay | Admitting: Cardiology

## 2017-01-13 ENCOUNTER — Ambulatory Visit (INDEPENDENT_AMBULATORY_CARE_PROVIDER_SITE_OTHER): Payer: Medicare Other | Admitting: *Deleted

## 2017-01-13 DIAGNOSIS — Z5181 Encounter for therapeutic drug level monitoring: Secondary | ICD-10-CM | POA: Diagnosis not present

## 2017-01-13 LAB — POCT INR: INR: 2.3

## 2017-02-17 NOTE — Progress Notes (Signed)
HPI The patient presents for follow up of her atrial fibrillation and cardiomyopathy.  Since I last saw her she has done relatively well. She takes her blood pressure and has a cuff that indicates red yellow or green as an indicator of control. She can't see a regular dial . He says that she was in red today. She occasionally takes an extra 25 mg of Cozaar because of this.  She gets around with a rolling walker. She wants to move to an apartment but with claustrophobia she wants to make sure it's a larger apartment.  She denies any chest pressure, neck or arm discomfort. She doesn't really notice palpitations, presyncope or syncope. She has no PND or orthopnea.   Allergies  Allergen Reactions  . Garlic Rash  . Penicillins     Current Outpatient Prescriptions  Medication Sig Dispense Refill  . acetaminophen (TYLENOL) 500 MG tablet Take 1,000 mg by mouth every 6 (six) hours as needed for pain.     . bacitracin ointment Apply 1 application topically 2 (two) times daily. 120 g prn  . carvedilol (COREG) 12.5 MG tablet TAKE 1 & 1/2 TABLETS BY MOUTH TWICE A DAY 270 tablet 1  . [facility] SENNA 8.6 MG tablet TAKE 1 TABLET BY MOUTH EVERY DAY 30 tablet 8  . digoxin (LANOXIN) 0.125 MG tablet TAKE 1 TABLET (125 MCG TOTAL) BY MOUTH DAILY. 90 tablet 2  . fluticasone (FLONASE) 50 MCG/ACT nasal spray Place 2 sprays into both nostrils daily. 16 g prn  . losartan (COZAAR) 50 MG tablet TAKE 1 TABLET (50 MG TOTAL) BY MOUTH DAILY. 90 tablet 3  . naproxen sodium (ALEVE) 220 MG tablet Take 220 mg by mouth daily as needed (Alternates taking this medication and Tylenol only if needed for arthritis pain).    . polyethylene glycol powder (GLYCOLAX/MIRALAX) powder Take 17 g by mouth daily as needed (for laxative/constipation). 527 g 3  . Saline (SIMPLY SALINE) 0.9 % AERS Place 1 spray into the nose daily as needed (for congestion).    . traMADol (ULTRAM) 50 MG tablet Take 50 mg by mouth every 6 (six) hours as needed.    .  triamcinolone cream (KENALOG) 0.1 % APPLY AT BEDTIME 454 g 3  . warfarin (COUMADIN) 5 MG tablet TAKE 1 TABLET BY MOUTH DAILY AS DIRECTED 90 tablet 0  . losartan (COZAAR) 25 MG tablet Take 1 tablet (25 mg total) by mouth every evening. 90 tablet 3  . UNABLE TO FIND HOVEROUND/SCOOTER USE AS DIRECTED #1 DX:CHF,ATRIAL FIBRILLATION,MITRAL REGURGITATION 1 each 0   No current facility-administered medications for this visit.     Past Medical History:  Diagnosis Date  . Atrial fibrillation (HCC)   . CHF (congestive heart failure) (HCC)    EF  30%  Echo 01/17/11,  EF 50% in 2014  . Chronic airway obstruction, not elsewhere classified   . Esophageal reflux   . Hernia   . Hypertension   . Hyposmolality and/or hyponatremia   . Mitral regurgitation   . Moderate tricuspid regurgitation by prior echocardiogram   . Morbid obesity (HCC)   . Osteoarthrosis, unspecified whether generalized or localized, unspecified site     Past Surgical History:  Procedure Laterality Date  . ABDOMINAL HYSTERECTOMY    . Right lower extremity surgery     ROS:     Decreased mobility and vision unchanged from previous. Otherwise as stated in the HPI and negative for all other systems  PHYSICAL EXAM BP (!) 146/83  Pulse (!) 58   Ht  (1.702 m)   Wt 174 lb (78.9 kg)   BMI 27.25 kg/m  GEN: No distress and looks her age.  NECK: No jugular venous distention at 90 degrees, waveform within normal limits, carotid upstroke brisk and symmetric, no bruits, no thyromegaly  LYMPHATICS: No cervical adenopathy  LUNGS: Clear to auscultation bilaterally  BACK: No CVA tenderness HEART: S1 and S2 within normal limits, no S3, no clicks, no rubs, no murmurs, irregular ABD: Positive bowel sounds normal in frequency in pitch, no bruits, no rebound, no guarding, unable to assess midline mass or bruit with the patient seated.  EXT: 2 plus pulses throughout, no edema , no cyanosis no clubbing   EKG:  Atrial fibrillation, rate  58, right bundle branch block, QTC prolonged.. 02/18/2017   ASSESSMENT AND PLAN  Atrial fibrillation -  She does   not notice that she's in this rhythm. She tolerates anticoagulation. She tolerates anticoagulation has this followed. She refuses to get a primary care physician. She will continue the meds as listed.  [name] has a CHA2DS2 - VASc score of 4 with a risk of stroke of 4%.  I will check a CBC and BMET today.   Hypertension -  Her blood pressures is elevated.  I will increase the Cozaar to add 25 mg every night.  She will keep her BP diary.    Cardiomyopathy - She had had a previous reduced ejection fraction but last year her EF was 50-55%. I suspect she is euvolemic. No change in therapy other than as above is indicated.  RBBB - This is chronic. No change in therapy.

## 2017-02-18 ENCOUNTER — Ambulatory Visit (INDEPENDENT_AMBULATORY_CARE_PROVIDER_SITE_OTHER): Payer: Medicare Other | Admitting: Cardiology

## 2017-02-18 ENCOUNTER — Encounter: Payer: Self-pay | Admitting: Cardiology

## 2017-02-18 ENCOUNTER — Other Ambulatory Visit: Payer: Medicare Other

## 2017-02-18 ENCOUNTER — Encounter: Payer: Self-pay | Admitting: *Deleted

## 2017-02-18 VITALS — BP 146/83 | HR 58 | Ht 67.0 in | Wt 174.0 lb

## 2017-02-18 DIAGNOSIS — Z79899 Other long term (current) drug therapy: Secondary | ICD-10-CM | POA: Diagnosis not present

## 2017-02-18 DIAGNOSIS — I10 Essential (primary) hypertension: Secondary | ICD-10-CM | POA: Diagnosis not present

## 2017-02-18 DIAGNOSIS — I4891 Unspecified atrial fibrillation: Secondary | ICD-10-CM

## 2017-02-18 MED ORDER — LOSARTAN POTASSIUM 25 MG PO TABS: 25.0000 mg | ORAL_TABLET | Freq: Every evening | ORAL | 3 refills | Status: DC

## 2017-02-18 NOTE — Patient Instructions (Addendum)
Medication Instructions:  Please increase Cozaar to 50 mg every morning and 25 mg at night. Continue all other medications as listed.  Labwork: Please have blood work today at [facility] (CBC,BMP)  Follow-Up: Follow up in 1 year with Dr. Antoine Poche in [place].  You will receive a letter in the mail 2 months before you are due.  Please call us when you receive this letter to schedule your follow up appointment.  If you need a refill on your cardiac medications before your next appointment, please call your pharmacy.  Thank you for choosing [facility]!!     Low-Sodium Eating Plan Sodium, which is an element that makes up salt, helps you maintain a healthy balance of fluids in your body. Too much sodium can increase your blood pressure and cause fluid and waste to be held in your body. Your health care provider or dietitian may recommend following this plan if you have high blood pressure (hypertension), kidney disease, liver disease, or heart failure. Eating less sodium can help lower your blood pressure, reduce swelling, and protect your heart, liver, and kidneys. What are tips for following this plan? General guidelines   Most people on this plan should limit their sodium intake to 1,500-2,000 mg (milligrams) of sodium each day. Reading food labels   The Nutrition Facts label lists the amount of sodium in one serving of the food. If you eat more than one serving, you must multiply the listed amount of sodium by the number of servings.  Choose foods with less than 140 mg of sodium per serving.  Avoid foods with 300 mg of sodium or more per serving. Shopping   Look for lower-sodium products, often labeled as "low-sodium" or "no salt added."  Always check the sodium content even if foods are labeled as "unsalted" or "no salt added".  Buy fresh foods.  Avoid canned foods and premade or frozen meals.  Avoid canned, cured, or processed meats  Buy breads that have less than 80  mg of sodium per slice. Cooking   Eat more home-cooked food and less restaurant, buffet, and fast food.  Avoid adding salt when cooking. Use salt-free seasonings or herbs instead of table salt or sea salt. Check with your health care provider or pharmacist before using salt substitutes.  Cook with plant-based oils, such as canola, sunflower, or olive oil. Meal planning   When eating at a restaurant, ask that your food be prepared with less salt or no salt, if possible.  Avoid foods that contain MSG (monosodium glutamate). MSG is sometimes added to [place] food, bouillon, and some canned foods. What foods are recommended? The items listed may not be a complete list. Talk with your dietitian about what dietary choices are best for you. Grains  Low-sodium cereals, including oats, puffed wheat and rice, and shredded wheat. Low-sodium crackers. Unsalted rice. Unsalted pasta. Low-sodium bread. Whole-grain breads and whole-grain pasta. Vegetables  Fresh or frozen vegetables. "No salt added" canned vegetables. "No salt added" tomato sauce and paste. Low-sodium or reduced-sodium tomato and vegetable juice. Fruits  Fresh, frozen, or canned fruit. Fruit juice. Meats and other protein foods  Fresh or frozen (no salt added) meat, poultry, seafood, and fish. Low-sodium canned tuna and salmon. Unsalted nuts. Dried peas, beans, and lentils without added salt. Unsalted canned beans. Eggs. Unsalted nut butters. Dairy  Milk. Soy milk. Cheese that is naturally low in sodium, such as ricotta cheese, fresh mozzarella, or Swiss cheese Low-sodium or reduced-sodium cheese. Cream cheese. Yogurt. Fats  and oils  Unsalted butter. Unsalted margarine with no trans fat. Vegetable oils such as canola or olive oils. Seasonings and other foods  Fresh and dried herbs and spices. Salt-free seasonings. Low-sodium mustard and ketchup. Sodium-free salad dressing. Sodium-free light mayonnaise. Fresh or refrigerated horseradish.  Lemon juice. Vinegar. Homemade, reduced-sodium, or low-sodium soups. Unsalted popcorn and pretzels. Low-salt or salt-free chips. What foods are not recommended? The items listed may not be a complete list. Talk with your dietitian about what dietary choices are best for you. Grains  Instant hot cereals. Bread stuffing, pancake, and biscuit mixes. Croutons. Seasoned rice or pasta mixes. Noodle soup cups. Boxed or frozen macaroni and cheese. Regular salted crackers. Self-rising flour. Vegetables  Sauerkraut, pickled vegetables, and relishes. Olives. [place] fries. Onion rings. Regular canned vegetables (not low-sodium or reduced-sodium). Regular canned tomato sauce and paste (not low-sodium or reduced-sodium). Regular tomato and vegetable juice (not low-sodium or reduced-sodium). Frozen vegetables in sauces. Meats and other protein foods  Meat or fish that is salted, canned, smoked, spiced, or pickled. Bacon, ham, sausage, hotdogs, corned beef, chipped beef, packaged lunch meats, salt pork, jerky, pickled herring, anchovies, regular canned tuna, sardines, salted nuts. Dairy  Processed cheese and cheese spreads. Cheese curds. Blue cheese. Feta cheese. String cheese. Regular cottage cheese. Buttermilk. Canned milk. Fats and oils  Salted butter. Regular margarine. Ghee. Bacon fat. Seasonings and other foods  Onion salt, garlic salt, seasoned salt, table salt, and sea salt. Canned and packaged gravies. Worcestershire sauce. Tartar sauce. Barbecue sauce. Teriyaki sauce. Soy sauce, including reduced-sodium. Steak sauce. Fish sauce. Oyster sauce. Cocktail sauce. Horseradish that you find on the shelf. Regular ketchup and mustard. Meat flavorings and tenderizers. Bouillon cubes. Hot sauce and Tabasco sauce. Premade or packaged marinades. Premade or packaged taco seasonings. Relishes. Regular salad dressings. Salsa. Potato and tortilla chips. Corn chips and puffs. Salted popcorn and pretzels. Canned or dried  soups. Pizza. Frozen entrees and pot pies. Summary  Eating less sodium can help lower your blood pressure, reduce swelling, and protect your heart, liver, and kidneys.  Most people on this plan should limit their sodium intake to 1,500-2,000 mg (milligrams) of sodium each day.  Canned, boxed, and frozen foods are high in sodium. Restaurant foods, fast foods, and pizza are also very high in sodium. You also get sodium by adding salt to food.  Try to cook at home, eat more fresh fruits and vegetables, and eat less fast food, canned, processed, or prepared foods. This information is not intended to replace advice given to you by your health care provider. Make sure you discuss any questions you have with your health care provider. Document Released: 03/28/2002 Document Revised: 09/29/2016 Document Reviewed: 09/29/2016 Elsevier Interactive Patient Education  2017 [employer].

## 2017-02-19 LAB — BASIC METABOLIC PANEL
BUN/Creatinine Ratio: 26 (ref 12–28)
BUN: 23 mg/dL (ref 8–27)
CALCIUM: 9.1 mg/dL (ref 8.7–10.3)
CO2: 20 mmol/L (ref 18–29)
CREATININE: 0.87 mg/dL (ref 0.57–1.00)
Chloride: 98 mmol/L (ref 96–106)
GFR, EST AFRICAN AMERICAN: 71 mL/min/{1.73_m2} (ref >59)
GFR, EST NON AFRICAN AMERICAN: 62 mL/min/{1.73_m2} (ref >59)
Glucose: 132 mg/dL — ABNORMAL HIGH (ref 65–99)
POTASSIUM: 4.4 mmol/L (ref 3.5–5.2)
Sodium: 137 mmol/L (ref 134–144)

## 2017-02-19 LAB — CBC
HEMATOCRIT: 39.6 % (ref 34.0–46.6)
HEMOGLOBIN: 13 g/dL (ref 11.1–15.9)
MCH: 29.8 pg (ref 26.6–33.0)
MCHC: 32.8 g/dL (ref 31.5–35.7)
MCV: 91 fL (ref 79–97)
Platelets: 260 10*3/uL (ref 150–379)
RBC: 4.36 x10E6/uL (ref 3.77–5.28)
RDW: 13.7 % (ref 12.3–15.4)
WBC: 7.4 10*3/uL (ref 3.4–10.8)

## 2017-02-24 ENCOUNTER — Ambulatory Visit (INDEPENDENT_AMBULATORY_CARE_PROVIDER_SITE_OTHER): Payer: Medicare Other | Admitting: *Deleted

## 2017-02-24 DIAGNOSIS — Z7901 Long term (current) use of anticoagulants: Secondary | ICD-10-CM | POA: Diagnosis not present

## 2017-02-24 DIAGNOSIS — I4891 Unspecified atrial fibrillation: Secondary | ICD-10-CM

## 2017-02-24 DIAGNOSIS — Z5181 Encounter for therapeutic drug level monitoring: Secondary | ICD-10-CM

## 2017-02-24 LAB — POCT INR: INR: 2.7

## 2017-04-07 ENCOUNTER — Ambulatory Visit (INDEPENDENT_AMBULATORY_CARE_PROVIDER_SITE_OTHER): Payer: Medicare Other | Admitting: *Deleted

## 2017-04-07 DIAGNOSIS — Z7901 Long term (current) use of anticoagulants: Secondary | ICD-10-CM | POA: Diagnosis not present

## 2017-04-07 DIAGNOSIS — Z5181 Encounter for therapeutic drug level monitoring: Secondary | ICD-10-CM | POA: Diagnosis not present

## 2017-04-07 DIAGNOSIS — I4891 Unspecified atrial fibrillation: Secondary | ICD-10-CM | POA: Diagnosis not present

## 2017-04-07 LAB — POCT INR: INR: 2.5

## 2017-04-09 ENCOUNTER — Other Ambulatory Visit: Payer: Self-pay | Admitting: Cardiology

## 2017-04-09 ENCOUNTER — Other Ambulatory Visit: Payer: Self-pay

## 2017-04-09 MED ORDER — CARVEDILOL 12.5 MG PO TABS: 18.7500 mg | ORAL_TABLET | Freq: Two times a day (BID) | ORAL | 1 refills | Status: DC

## 2017-04-09 NOTE — Telephone Encounter (Signed)
Rx(s) sent to pharmacy electronically.  

## 2017-06-04 ENCOUNTER — Ambulatory Visit (INDEPENDENT_AMBULATORY_CARE_PROVIDER_SITE_OTHER): Payer: Medicare Other | Admitting: *Deleted

## 2017-06-04 DIAGNOSIS — Z5181 Encounter for therapeutic drug level monitoring: Secondary | ICD-10-CM | POA: Diagnosis not present

## 2017-06-04 DIAGNOSIS — I480 Paroxysmal atrial fibrillation: Secondary | ICD-10-CM | POA: Diagnosis not present

## 2017-06-04 LAB — POCT INR: INR: 4.3

## 2017-06-05 ENCOUNTER — Other Ambulatory Visit: Payer: Self-pay | Admitting: Cardiology

## 2017-06-16 ENCOUNTER — Telehealth: Payer: Self-pay | Admitting: Cardiology

## 2017-06-16 NOTE — Telephone Encounter (Signed)
Bjorn Loser( Daughter in [place]) is calling about a letter that Dr.Hochrein wrote so that she would be able to get into a two bedroom at [facility]. Mrs. Abdallah is Claustraphobic . The Facility stated that thye faxed some forms  Over for him to complete . They are calling to see if he has the forms , because they are needed back by 06/23/17. Please call

## 2017-06-16 NOTE — Telephone Encounter (Signed)
Maybe

## 2017-06-16 NOTE — Telephone Encounter (Signed)
If you have them, would you mind filling them out when you get a chance please? I can fax them back as soon as you can get them to me. I am not sure who else to ask about them since this pt is seen in [place].  Thanks!!

## 2017-06-18 ENCOUNTER — Ambulatory Visit (INDEPENDENT_AMBULATORY_CARE_PROVIDER_SITE_OTHER): Payer: Medicare Other | Admitting: *Deleted

## 2017-06-18 DIAGNOSIS — Z5181 Encounter for therapeutic drug level monitoring: Secondary | ICD-10-CM | POA: Diagnosis not present

## 2017-06-18 DIAGNOSIS — I480 Paroxysmal atrial fibrillation: Secondary | ICD-10-CM | POA: Diagnosis not present

## 2017-06-18 LAB — POCT INR: INR: 3

## 2017-06-18 NOTE — Telephone Encounter (Signed)
Pam do you have any information about this pt

## 2017-06-18 NOTE — Telephone Encounter (Signed)
I have not seen any paperwork of forms on this patient.  The only thing I am aware of is the letter that was completed for her 02/18/2017.

## 2017-06-20 NOTE — Telephone Encounter (Signed)
I have no incomplete paper work.

## 2017-06-23 ENCOUNTER — Other Ambulatory Visit (HOSPITAL_COMMUNITY): Payer: Medicare Other

## 2017-06-23 NOTE — Telephone Encounter (Signed)
New Message     Pt daughter in law said that the paperwork has been and they need to get them filled out asap

## 2017-06-25 ENCOUNTER — Encounter (HOSPITAL_COMMUNITY): Admission: RE | Payer: Self-pay | Source: Ambulatory Visit

## 2017-06-25 ENCOUNTER — Other Ambulatory Visit: Payer: Self-pay | Admitting: Cardiology

## 2017-06-25 ENCOUNTER — Ambulatory Visit (HOSPITAL_COMMUNITY): Admission: RE | Admit: 2017-06-25 | Payer: Medicare Other | Source: Ambulatory Visit | Admitting: Cardiology

## 2017-06-25 SURGERY — CARDIOVERSION
Anesthesia: Monitor Anesthesia Care

## 2017-06-25 NOTE — Telephone Encounter (Signed)
Spoke with pt letting her know Dr Antoine PocheHochrein is out of office until Monday and he will fill out paperwork when he's in office

## 2017-07-01 ENCOUNTER — Telehealth: Payer: Self-pay | Admitting: Cardiology

## 2017-07-01 NOTE — Telephone Encounter (Signed)
Paperwork left at front desk for pt to pick up, left detailed message on telephone.

## 2017-07-01 NOTE — Telephone Encounter (Signed)
New message      Please fax paperwork that is waiting for pt to pick up to [phone] attn deanna campbell---[facility] [address].  Patient lives in [place] and is not able to come here to get it.  If there are any problems, please call

## 2017-07-01 NOTE — Telephone Encounter (Signed)
There are no forms at the front desk to pick up

## 2017-07-01 NOTE — Telephone Encounter (Signed)
Melanie LoserRhonda wants to know if he If the paper work is ready please?

## 2017-07-06 NOTE — Telephone Encounter (Signed)
Form was faxed to [employer] and copy mailed to pt.[name]

## 2017-07-09 ENCOUNTER — Other Ambulatory Visit: Payer: Self-pay

## 2017-07-21 ENCOUNTER — Ambulatory Visit (INDEPENDENT_AMBULATORY_CARE_PROVIDER_SITE_OTHER): Payer: Medicare Other | Admitting: *Deleted

## 2017-07-21 DIAGNOSIS — I4891 Unspecified atrial fibrillation: Secondary | ICD-10-CM | POA: Diagnosis not present

## 2017-07-21 DIAGNOSIS — Z5181 Encounter for therapeutic drug level monitoring: Secondary | ICD-10-CM | POA: Diagnosis not present

## 2017-07-21 DIAGNOSIS — I480 Paroxysmal atrial fibrillation: Secondary | ICD-10-CM | POA: Diagnosis not present

## 2017-07-21 LAB — POCT INR: INR: 3.2

## 2017-08-27 ENCOUNTER — Ambulatory Visit (INDEPENDENT_AMBULATORY_CARE_PROVIDER_SITE_OTHER): Payer: Medicare Other | Admitting: *Deleted

## 2017-08-27 DIAGNOSIS — I480 Paroxysmal atrial fibrillation: Secondary | ICD-10-CM

## 2017-08-27 DIAGNOSIS — Z5181 Encounter for therapeutic drug level monitoring: Secondary | ICD-10-CM

## 2017-08-27 LAB — POCT INR: INR: 1.8

## 2017-09-02 ENCOUNTER — Other Ambulatory Visit: Payer: Self-pay | Admitting: Cardiology

## 2017-09-18 ENCOUNTER — Other Ambulatory Visit: Payer: Self-pay | Admitting: Cardiology

## 2017-09-24 ENCOUNTER — Ambulatory Visit (INDEPENDENT_AMBULATORY_CARE_PROVIDER_SITE_OTHER): Payer: Medicare Other | Admitting: *Deleted

## 2017-09-24 DIAGNOSIS — I480 Paroxysmal atrial fibrillation: Secondary | ICD-10-CM

## 2017-09-24 DIAGNOSIS — Z5181 Encounter for therapeutic drug level monitoring: Secondary | ICD-10-CM

## 2017-09-24 LAB — POCT INR: INR: 3

## 2017-10-04 ENCOUNTER — Other Ambulatory Visit: Payer: Self-pay | Admitting: Cardiology

## 2017-10-27 ENCOUNTER — Ambulatory Visit (INDEPENDENT_AMBULATORY_CARE_PROVIDER_SITE_OTHER): Payer: Medicare Other | Admitting: *Deleted

## 2017-10-27 DIAGNOSIS — I480 Paroxysmal atrial fibrillation: Secondary | ICD-10-CM | POA: Diagnosis not present

## 2017-10-27 DIAGNOSIS — Z5181 Encounter for therapeutic drug level monitoring: Secondary | ICD-10-CM | POA: Diagnosis not present

## 2017-10-27 DIAGNOSIS — Z7901 Long term (current) use of anticoagulants: Secondary | ICD-10-CM

## 2017-10-27 DIAGNOSIS — I4891 Unspecified atrial fibrillation: Secondary | ICD-10-CM | POA: Diagnosis not present

## 2017-10-27 LAB — POCT INR: INR: 3.9

## 2017-10-27 NOTE — Patient Instructions (Signed)
Hold coumadin tomorrow then resume 1 tablet daily except 1/2 tablet on Sundays, Tuesdays and Thursdays Took coumadin this morning Recheck in 4 weeks Keep intake of greens consistent

## 2017-11-01 NOTE — Progress Notes (Signed)
HPI The patient presents for follow up of her atrial fibrillation and cardiomyopathy.  Since I last saw her she has had increasing lower extremity swelling.  She drinks a lot of fluid but she does watch her salt.  It seems like she keeps her feet up fairly much.  She has sometimes weeping edema.  She is not sure of her weights.  She is not describing any palpitations, presyncope or syncope.  Is not describing any new shortness of breath, PND or orthopnea.  She has a blood pressure cuff that indicates red yellow or green she can see a regular dial and she says that hers is typically in the green.  She uses a rolling walker.   Allergies  Allergen Reactions  . Garlic Rash  . Penicillins     Current Outpatient Medications  Medication Sig Dispense Refill  . acetaminophen (TYLENOL) 500 MG tablet Take 1,000 mg by mouth every 6 (six) hours as needed for pain.     . bacitracin ointment Apply 1 application topically 2 (two) times daily. 120 g prn  . carvedilol (COREG) 12.5 MG tablet TAKE 1.5 TABLETS BY MOUTH 2 TIMES DAILY WITH A MEAL. 90 tablet 0  . [facility] SENNA 8.6 MG tablet TAKE 1 TABLET BY MOUTH EVERY DAY 30 tablet 8  . digoxin (LANOXIN) 0.125 MG tablet TAKE 1 TABLET (125 MCG TOTAL) BY MOUTH DAILY. 90 tablet 2  . fluticasone (FLONASE) 50 MCG/ACT nasal spray Place 2 sprays into both nostrils daily. 16 g prn  . losartan (COZAAR) 50 MG tablet TAKE 1 TABLET (50 MG TOTAL) BY MOUTH DAILY. 90 tablet 3  . naproxen sodium (ALEVE) 220 MG tablet Take 220 mg by mouth daily as needed (Alternates taking this medication and Tylenol only if needed for arthritis pain).    . polyethylene glycol powder (GLYCOLAX/MIRALAX) powder Take 17 g by mouth daily as needed (for laxative/constipation). 527 g 3  . Saline (SIMPLY SALINE) 0.9 % AERS Place 1 spray into the nose daily as needed (for congestion).    . traMADol (ULTRAM) 50 MG tablet Take 50 mg by mouth every 6 (six) hours as needed.    . triamcinolone cream (KENALOG)  0.1 % APPLY AT BEDTIME 454 g 3  . UNABLE TO FIND HOVEROUND/SCOOTER USE AS DIRECTED #1 DX:CHF,ATRIAL FIBRILLATION,MITRAL REGURGITATION 1 each 0  . warfarin (COUMADIN) 5 MG tablet TAKE 1/2 TO 1 TABLET DAILY AS DIRECTED BY COUMADIN CLINIC 90 tablet 0  . furosemide (LASIX) 20 MG tablet Take 1 tablet (20 mg total) by mouth daily. 90 tablet 3  . losartan (COZAAR) 25 MG tablet Take 1 tablet (25 mg total) by mouth every evening. 90 tablet 3   No current facility-administered medications for this visit.     Past Medical History:  Diagnosis Date  . Atrial fibrillation (HCC)   . CHF (congestive heart failure) (HCC)    EF  30%  Echo 01/17/11,  EF 50% in 2014  . Chronic airway obstruction, not elsewhere classified   . Esophageal reflux   . Hernia   . Hypertension   . Hyposmolality and/or hyponatremia   . Mitral regurgitation   . Moderate tricuspid regurgitation by prior echocardiogram   . Morbid obesity (HCC)   . Osteoarthrosis, unspecified whether generalized or localized, unspecified site     Past Surgical History:  Procedure Laterality Date  . ABDOMINAL HYSTERECTOMY    . Right lower extremity surgery     ROS:    As stated in the  HPI and negative for all other systems.  PHYSICAL EXAM BP (!) 170/80   Pulse 60   Ht 5\' 7"  (1.702 m)   Wt 180 lb (81.6 kg)   SpO2 97%   BMI 28.19 kg/m   PHYSICAL EXAM GEN:  No distress NECK:  No jugular venous distention at 90 degrees, waveform within normal limits, carotid upstroke brisk and symmetric, no bruits, no thyromegaly LYMPHATICS:  No cervical adenopathy LUNGS:  Clear to auscultation bilaterally BACK:  No CVA tenderness CHEST:  Unremarkable HEART:  S1 and S2 within normal limits, no S3, no clicks, no rubs, no murmurs, irregular ABD:  Positive bowel sounds normal in frequency in pitch, no bruits, no rebound, no guarding, unable to assess midline mass or bruit with the patient seated. EXT:  2 plus pulses throughout, moderate edema, no cyanosis  no clubbing SKIN:  No rashes no nodules NEURO:  Cranial nerves II through XII grossly intact, motor grossly intact throughout PSYCH:  Cognitively intact, oriented to person place and time    ASSESSMENT AND PLAN  Atrial fibrillation -   [name] has a CHA2DS2 - VASc score of 4 with a risk of stroke of 4%.  I will follow-up with a CBC when she gets blood work done.  Hypertension -  Her blood pressures is elevated.butt it is not at home so I will not make any med changes.   Cardiomyopathy - She had had a previous reduced ejection fraction but last year her EF was 50-55%.  No further imaging is planned at this point.   RBBB - This is chronic.  No further imaging.   EDEMA - I am going to have her start Lasix 20 mg daily.  We talked about fluid restriction as she does drink a lot of fluid.  She can get a basic metabolic profile in about 1 week.

## 2017-11-03 ENCOUNTER — Encounter: Payer: Self-pay | Admitting: Cardiology

## 2017-11-04 ENCOUNTER — Ambulatory Visit (INDEPENDENT_AMBULATORY_CARE_PROVIDER_SITE_OTHER): Payer: Medicare Other | Admitting: Cardiology

## 2017-11-04 ENCOUNTER — Encounter: Payer: Self-pay | Admitting: Cardiology

## 2017-11-04 VITALS — BP 170/80 | HR 60 | Ht 67.0 in | Wt 180.0 lb

## 2017-11-04 DIAGNOSIS — I482 Chronic atrial fibrillation, unspecified: Secondary | ICD-10-CM

## 2017-11-04 DIAGNOSIS — M7989 Other specified soft tissue disorders: Secondary | ICD-10-CM

## 2017-11-04 DIAGNOSIS — I10 Essential (primary) hypertension: Secondary | ICD-10-CM

## 2017-11-04 MED ORDER — FUROSEMIDE 20 MG PO TABS: 20.0000 mg | ORAL_TABLET | Freq: Every day | ORAL | 3 refills | Status: DC

## 2017-11-04 NOTE — Patient Instructions (Signed)
Medication Instructions:  Please start Furosemide 20 mg a day. Continue all other medications as listed.  Labwork: Please have blood work in approximately 10 days.  (Rx given)  Follow-Up: Follow up in 2 months with Dr [place]ine [place]Hochrein in [place].  If you need a refill on your cardiac medications before your next appointment, please call your pharmacy.  Thank you for choosing [facility]!!

## 2017-11-05 ENCOUNTER — Other Ambulatory Visit: Payer: Self-pay | Admitting: Cardiology

## 2017-11-05 NOTE — Telephone Encounter (Signed)
Rx(s) sent to pharmacy electronically.  

## 2017-11-24 ENCOUNTER — Ambulatory Visit (INDEPENDENT_AMBULATORY_CARE_PROVIDER_SITE_OTHER): Payer: Medicare Other | Admitting: *Deleted

## 2017-11-24 DIAGNOSIS — Z5181 Encounter for therapeutic drug level monitoring: Secondary | ICD-10-CM

## 2017-11-24 DIAGNOSIS — I480 Paroxysmal atrial fibrillation: Secondary | ICD-10-CM

## 2017-11-24 DIAGNOSIS — Z79899 Other long term (current) drug therapy: Secondary | ICD-10-CM | POA: Diagnosis not present

## 2017-11-24 DIAGNOSIS — I4891 Unspecified atrial fibrillation: Secondary | ICD-10-CM

## 2017-11-24 LAB — POCT INR: INR: 2.3

## 2017-11-24 NOTE — Patient Instructions (Signed)
Continue coumadin 1 tablet daily except 1/2 tablet on Sundays, Tuesdays and Thursdays Took coumadin this morning Recheck in 4 weeks Keep intake of greens consistent

## 2017-12-22 ENCOUNTER — Ambulatory Visit (INDEPENDENT_AMBULATORY_CARE_PROVIDER_SITE_OTHER): Payer: Medicare Other | Admitting: *Deleted

## 2017-12-22 DIAGNOSIS — Z5181 Encounter for therapeutic drug level monitoring: Secondary | ICD-10-CM | POA: Diagnosis not present

## 2017-12-22 DIAGNOSIS — I480 Paroxysmal atrial fibrillation: Secondary | ICD-10-CM | POA: Diagnosis not present

## 2017-12-22 DIAGNOSIS — I4891 Unspecified atrial fibrillation: Secondary | ICD-10-CM

## 2017-12-22 LAB — POCT INR: INR: 2.6

## 2017-12-22 NOTE — Patient Instructions (Signed)
Continue coumadin 1 tablet daily except 1/2 tablet on Sundays, Tuesdays and Thursdays Took coumadin this morning Recheck in 4 weeks Keep intake of greens consistent

## 2017-12-28 ENCOUNTER — Telehealth: Payer: Self-pay | Admitting: Cardiology

## 2017-12-28 NOTE — Telephone Encounter (Signed)
Closed Encounter  °

## 2017-12-30 ENCOUNTER — Other Ambulatory Visit: Payer: Self-pay | Admitting: Cardiology

## 2018-01-01 ENCOUNTER — Telehealth: Payer: Self-pay | Admitting: Cardiology

## 2018-01-01 NOTE — Telephone Encounter (Signed)
°*  STAT* If patient is at the pharmacy, call can be transferred to refill team  1. Which medications need to be refilled? (please list name of each medication and dose if known) Flonase 50 mcg/act nasal spray  2. Which pharmacy/location (including street and city if local pharmacy) is medication to be sent to? [id number] [place], [place] - [address]  3. Do they need a 30 day or 90 day supply?

## 2018-01-04 NOTE — Telephone Encounter (Signed)
OK to refill

## 2018-01-06 ENCOUNTER — Other Ambulatory Visit: Payer: Self-pay | Admitting: Cardiology

## 2018-01-06 ENCOUNTER — Ambulatory Visit: Payer: Medicare Other | Admitting: Cardiology

## 2018-01-06 ENCOUNTER — Telehealth: Payer: Self-pay | Admitting: Cardiology

## 2018-01-06 NOTE — Telephone Encounter (Signed)
Routed to [place], CMA for follow up

## 2018-01-06 NOTE — Telephone Encounter (Signed)
Follow Up:    Calling to check on the status of the order that was faxed over on 01-01-18.Please fax  asap to [phone]

## 2018-01-07 MED ORDER — FLUTICASONE PROPIONATE 50 MCG/ACT NA SUSP: 2.0000 | Freq: Every day | NASAL | 2 refills | Status: DC

## 2018-01-07 NOTE — Addendum Note (Signed)
Addended by: Barrie DunkerHOMAS, Ezella Kell N on: 01/07/2018 08:22 AM   Modules accepted: Orders

## 2018-01-07 NOTE — Telephone Encounter (Signed)
Rx has been sent to the pharmacy electronically. ° °

## 2018-01-08 ENCOUNTER — Other Ambulatory Visit: Payer: Self-pay | Admitting: Cardiology

## 2018-01-08 NOTE — Telephone Encounter (Signed)
REFILL 

## 2018-01-08 NOTE — Telephone Encounter (Signed)
Call and let pt know that her flonase nasal spray was send into her pharmacy

## 2018-01-19 ENCOUNTER — Ambulatory Visit (INDEPENDENT_AMBULATORY_CARE_PROVIDER_SITE_OTHER): Payer: Medicare Other | Admitting: *Deleted

## 2018-01-19 DIAGNOSIS — Z5181 Encounter for therapeutic drug level monitoring: Secondary | ICD-10-CM

## 2018-01-19 DIAGNOSIS — I480 Paroxysmal atrial fibrillation: Secondary | ICD-10-CM | POA: Diagnosis not present

## 2018-01-19 DIAGNOSIS — I4891 Unspecified atrial fibrillation: Secondary | ICD-10-CM

## 2018-01-19 LAB — POCT INR: INR: 4.3

## 2018-01-19 NOTE — Patient Instructions (Signed)
Hold coumadin tomorrow then resume 1 tablet daily except 1/2 tablet on Sundays, Tuesdays and Thursdays Took coumadin this morning Recheck in 4 weeks Keep intake of greens consistent

## 2018-01-22 ENCOUNTER — Other Ambulatory Visit: Payer: Self-pay | Admitting: Cardiology

## 2018-01-22 DIAGNOSIS — H353134 Nonexudative age-related macular degeneration, bilateral, advanced atrophic with subfoveal involvement: Secondary | ICD-10-CM | POA: Diagnosis not present

## 2018-02-09 ENCOUNTER — Other Ambulatory Visit: Payer: Self-pay | Admitting: Cardiology

## 2018-02-09 ENCOUNTER — Ambulatory Visit (INDEPENDENT_AMBULATORY_CARE_PROVIDER_SITE_OTHER): Payer: Medicare Other | Admitting: *Deleted

## 2018-02-09 DIAGNOSIS — I480 Paroxysmal atrial fibrillation: Secondary | ICD-10-CM | POA: Diagnosis not present

## 2018-02-09 DIAGNOSIS — I4891 Unspecified atrial fibrillation: Secondary | ICD-10-CM

## 2018-02-09 DIAGNOSIS — Z5181 Encounter for therapeutic drug level monitoring: Secondary | ICD-10-CM

## 2018-02-09 LAB — POCT INR: INR: 2.5

## 2018-02-09 NOTE — Patient Instructions (Signed)
Continue coumadin 1 tablet daily except 1/2 tablet on Sundays, Tuesdays and Thursdays Recheck in 4 weeks Keep intake of greens consistent

## 2018-02-09 NOTE — Telephone Encounter (Signed)
REFILL 

## 2018-02-23 NOTE — Progress Notes (Signed)
HPI The patient presents for follow up of her atrial fibrillation and cardiomyopathy.  At the last visit she had increasing lower extremity swelling.  I started Lasix.  She is done well since I last saw her.  She lives in an apartment and walks with a walker.  She says that her leg swelling is much improved.  The patient denies any new symptoms such as chest discomfort, neck or arm discomfort. There has been no new shortness of breath, PND or orthopnea. There have been no reported palpitations, presyncope or syncope.      Allergies  Allergen Reactions  . Garlic Rash  . Penicillins     Current Outpatient Medications  Medication Sig Dispense Refill  . acetaminophen (TYLENOL) 500 MG tablet Take 1,000 mg by mouth every 6 (six) hours as needed for pain.     . bacitracin ointment Apply 1 application topically 2 (two) times daily. 120 g prn  . carvedilol (COREG) 12.5 MG tablet TAKE 1&1/2 TABLETS BY MOUTH 2 TIMES DAILY WITH A MEAL. 90 tablet 6  . [facility] SENNA 8.6 MG tablet TAKE 1 TABLET BY MOUTH EVERY DAY 30 tablet 8  . digoxin (LANOXIN) 0.125 MG tablet TAKE 1 TABLET (125 MCG TOTAL) BY MOUTH DAILY. 90 tablet 1  . fluticasone (FLONASE) 50 MCG/ACT nasal spray Place 2 sprays into both nostrils daily. 1 g 2  . furosemide (LASIX) 20 MG tablet Take 1 tablet (20 mg total) by mouth daily. 90 tablet 3  . losartan (COZAAR) 25 MG tablet Take 25 mg by mouth daily as needed.    Marland Kitchen losartan (COZAAR) 50 MG tablet TAKE 1 TABLET (50 MG TOTAL) BY MOUTH DAILY. 90 tablet 3  . naproxen sodium (ALEVE) 220 MG tablet Take 220 mg by mouth daily as needed (Alternates taking this medication and Tylenol only if needed for arthritis pain).    . polyethylene glycol powder (GLYCOLAX/MIRALAX) powder Take 17 g by mouth daily as needed (for laxative/constipation). 527 g 3  . Saline (SIMPLY SALINE) 0.9 % AERS Place 1 spray into the nose daily as needed (for congestion).    . traMADol (ULTRAM) 50 MG tablet Take 50 mg by mouth every  6 (six) hours as needed.    . triamcinolone cream (KENALOG) 0.1 % APPLY AT BEDTIME 454 g 3  . warfarin (COUMADIN) 5 MG tablet TAKE 1/2 TO 1 TABLET DAILY AS DIRECTED BY COUMADIN CLINIC 90 tablet 0  . losartan (COZAAR) 25 MG tablet TAKE 1 TABLET BY MOUTH EVERY DAY IN THE EVENING 90 tablet 0  . UNABLE TO FIND HOVEROUND/SCOOTER USE AS DIRECTED #1 DX:CHF,ATRIAL FIBRILLATION,MITRAL REGURGITATION 1 each 0   No current facility-administered medications for this visit.     Past Medical History:  Diagnosis Date  . Atrial fibrillation (HCC)   . CHF (congestive heart failure) (HCC)    EF  30%  Echo 01/17/11,  EF 50% in 2014  . Chronic airway obstruction, not elsewhere classified   . Esophageal reflux   . Hernia   . Hypertension   . Hyposmolality and/or hyponatremia   . Mitral regurgitation   . Moderate tricuspid regurgitation by prior echocardiogram   . Morbid obesity (HCC)   . Osteoarthrosis, unspecified whether generalized or localized, unspecified site     Past Surgical History:  Procedure Laterality Date  . ABDOMINAL HYSTERECTOMY    . Right lower extremity surgery     ROS:    As stated in the HPI and negative for all other systems.  PHYSICAL EXAM BP (!) 185/93   Pulse 62   Ht  (1.702 m)   Wt 174 lb (78.9 kg)   BMI 27.25 kg/m   PHYSICAL EXAM GEN:  No distress NECK:  No jugular venous distention at 90 degrees, waveform within normal limits, carotid upstroke brisk and symmetric, no bruits, no thyromegaly LUNGS:  Clear to auscultation bilaterally CHEST:  Unremarkable HEART:  S1 and S2 within normal limits, no S3, no clicks, no rubs, no murmurs, irregular ABD:  Positive bowel sounds normal in frequency in pitch, no bruits, no rebound, no guarding, unable to assess midline mass or bruit with the patient seated. EXT:  2 plus pulses throughout, moderate edema, no cyanosis no clubbing    ASSESSMENT AND PLAN  Atrial fibrillation -   [name] has a CHA2DS2 - VASc  score of 4 with a risk of stroke of 4%.  I will check a CBC.  No change in therapy.   Hypertension -  The blood pressure is at target. No change in medications is indicated. We will continue with therapeutic lifestyle changes (TLC).  I will check a BMET.   Cardiomyopathy - She had had a previous reduced ejection fraction but last year her EF was 50-55%.  No further imaging.   RBBB - This is chronic.    EDEMA - This is improved.  I will check blood work as above.

## 2018-02-24 ENCOUNTER — Encounter: Payer: Self-pay | Admitting: Cardiology

## 2018-02-24 ENCOUNTER — Ambulatory Visit (INDEPENDENT_AMBULATORY_CARE_PROVIDER_SITE_OTHER): Payer: Medicare Other | Admitting: Cardiology

## 2018-02-24 VITALS — BP 185/93 | HR 62 | Ht 67.0 in | Wt 174.0 lb

## 2018-02-24 DIAGNOSIS — I10 Essential (primary) hypertension: Secondary | ICD-10-CM

## 2018-02-24 DIAGNOSIS — I4510 Unspecified right bundle-branch block: Secondary | ICD-10-CM | POA: Diagnosis not present

## 2018-02-24 DIAGNOSIS — Z5181 Encounter for therapeutic drug level monitoring: Secondary | ICD-10-CM

## 2018-02-24 DIAGNOSIS — I480 Paroxysmal atrial fibrillation: Secondary | ICD-10-CM

## 2018-02-24 NOTE — Patient Instructions (Signed)
Medication Instructions:  The current medical regimen is effective;  continue present plan and medications.  Labwork: Please have blood work drawn at [facility] (CBC and BMP)  Follow-Up: Follow up in 6 months with Dr. Antoine Poche.  You will receive a letter in the mail 2 months before you are due.  Please call us when you receive this letter to schedule your follow up appointment.  If you need a refill on your cardiac medications before your next appointment, please call your pharmacy.  Thank you for choosing [facility]!!

## 2018-03-09 ENCOUNTER — Ambulatory Visit (INDEPENDENT_AMBULATORY_CARE_PROVIDER_SITE_OTHER): Payer: Medicare Other | Admitting: *Deleted

## 2018-03-09 DIAGNOSIS — I4891 Unspecified atrial fibrillation: Secondary | ICD-10-CM | POA: Diagnosis not present

## 2018-03-09 DIAGNOSIS — I480 Paroxysmal atrial fibrillation: Secondary | ICD-10-CM | POA: Diagnosis not present

## 2018-03-09 DIAGNOSIS — Z5181 Encounter for therapeutic drug level monitoring: Secondary | ICD-10-CM | POA: Diagnosis not present

## 2018-03-09 LAB — POCT INR: INR: 2.5 (ref 2.0–3.0)

## 2018-03-09 NOTE — Patient Instructions (Signed)
Continue coumadin 1 tablet daily except 1/2 tablet on Sundays, Tuesdays and Thursdays Recheck in 4 weeks Keep intake of greens consistent

## 2018-03-17 ENCOUNTER — Other Ambulatory Visit (HOSPITAL_COMMUNITY)
Admission: RE | Admit: 2018-03-17 | Discharge: 2018-03-17 | Disposition: A | Discharge status: Home / Self Care | Payer: Medicare Other | Source: Ambulatory Visit | Attending: Cardiology | Admitting: Cardiology

## 2018-03-17 DIAGNOSIS — I10 Essential (primary) hypertension: Secondary | ICD-10-CM | POA: Insufficient documentation

## 2018-03-17 DIAGNOSIS — Z5181 Encounter for therapeutic drug level monitoring: Secondary | ICD-10-CM | POA: Diagnosis not present

## 2018-03-17 DIAGNOSIS — I480 Paroxysmal atrial fibrillation: Secondary | ICD-10-CM | POA: Insufficient documentation

## 2018-03-17 LAB — BASIC METABOLIC PANEL
ANION GAP: 10 (ref 5–15)
BUN: 13 mg/dL (ref 6–20)
CALCIUM: 8.9 mg/dL (ref 8.9–10.3)
CO2: 22 mmol/L (ref 22–32)
CREATININE: 0.67 mg/dL (ref 0.44–1.00)
Chloride: 101 mmol/L (ref 101–111)
Glucose, Bld: 138 mg/dL — ABNORMAL HIGH (ref 65–99)
Potassium: 4 mmol/L (ref 3.5–5.1)
Sodium: 133 mmol/L — ABNORMAL LOW (ref 135–145)

## 2018-03-17 LAB — CBC
HCT: 40.2 % (ref 36.0–46.0)
Hemoglobin: 13.2 g/dL (ref 12.0–15.0)
MCH: 31.1 pg (ref 26.0–34.0)
MCHC: 32.8 g/dL (ref 30.0–36.0)
MCV: 94.8 fL (ref 78.0–100.0)
Platelets: 233 10*3/uL (ref 150–400)
RBC: 4.24 MIL/uL (ref 3.87–5.11)
RDW: 12.7 % (ref 11.5–15.5)
WBC: 7.9 10*3/uL (ref 4.0–10.5)

## 2018-04-02 ENCOUNTER — Other Ambulatory Visit: Payer: Self-pay | Admitting: Cardiology

## 2018-04-06 ENCOUNTER — Ambulatory Visit (INDEPENDENT_AMBULATORY_CARE_PROVIDER_SITE_OTHER): Payer: Medicare Other | Admitting: *Deleted

## 2018-04-06 ENCOUNTER — Other Ambulatory Visit: Payer: Self-pay | Admitting: Cardiology

## 2018-04-06 DIAGNOSIS — I4891 Unspecified atrial fibrillation: Secondary | ICD-10-CM

## 2018-04-06 DIAGNOSIS — Z5181 Encounter for therapeutic drug level monitoring: Secondary | ICD-10-CM | POA: Diagnosis not present

## 2018-04-06 DIAGNOSIS — I480 Paroxysmal atrial fibrillation: Secondary | ICD-10-CM

## 2018-04-06 LAB — POCT INR: INR: 3.3 — AB (ref 2.0–3.0)

## 2018-04-06 NOTE — Patient Instructions (Signed)
Took coumadin this morning.  Take coumadin 1/2 tablet tomorrow then resume 1 tablet daily except 1/2 tablet on Sundays, Tuesdays and Thursdays Recheck in 4 weeks Increase greens by 1 serving a week

## 2018-04-22 ENCOUNTER — Other Ambulatory Visit: Payer: Self-pay | Admitting: Cardiology

## 2018-05-04 ENCOUNTER — Ambulatory Visit (INDEPENDENT_AMBULATORY_CARE_PROVIDER_SITE_OTHER): Payer: Medicare Other | Admitting: *Deleted

## 2018-05-04 DIAGNOSIS — Z5181 Encounter for therapeutic drug level monitoring: Secondary | ICD-10-CM

## 2018-05-04 DIAGNOSIS — I480 Paroxysmal atrial fibrillation: Secondary | ICD-10-CM | POA: Diagnosis not present

## 2018-05-04 LAB — POCT INR: INR: 3.2 — AB (ref 2.0–3.0)

## 2018-05-04 NOTE — Patient Instructions (Signed)
Description   Do not take coumadin tomorrow July 17th as she has already taken her dose today then change coumadin dose to 1/2 tablet (2.5mg ) daily except 1 tablet (5mg ) on Mondays Wednesdays and Fridays Recheck in 2 weeks

## 2018-05-06 ENCOUNTER — Telehealth: Payer: Self-pay | Admitting: Cardiology

## 2018-05-06 NOTE — Telephone Encounter (Signed)
New message    Requesting new order for handicap placard

## 2018-05-06 NOTE — Telephone Encounter (Signed)
Patient needs a handicap placard. The one that Dr. Antoine PocheHochrein has given before expired on 05/04/18 due to being temporary. Patient is requesting to have a permanent placard if possible. Please mail to patient's home address listed. Will forward to Dr. Jenene SlickerHochrein's nurse.

## 2018-05-07 NOTE — Telephone Encounter (Signed)
Handicap placard signed and mailed to pt

## 2018-05-09 ENCOUNTER — Other Ambulatory Visit: Payer: Self-pay | Admitting: Cardiology

## 2018-05-18 ENCOUNTER — Ambulatory Visit (INDEPENDENT_AMBULATORY_CARE_PROVIDER_SITE_OTHER): Payer: Medicare Other | Admitting: *Deleted

## 2018-05-18 DIAGNOSIS — I4891 Unspecified atrial fibrillation: Secondary | ICD-10-CM | POA: Diagnosis not present

## 2018-05-18 DIAGNOSIS — Z5181 Encounter for therapeutic drug level monitoring: Secondary | ICD-10-CM | POA: Diagnosis not present

## 2018-05-18 DIAGNOSIS — I480 Paroxysmal atrial fibrillation: Secondary | ICD-10-CM

## 2018-05-18 LAB — POCT INR: INR: 2.5 (ref 2.0–3.0)

## 2018-05-18 NOTE — Patient Instructions (Signed)
Pt held coumadin day after last visit but then resumed 1 tablet daily except 1/2 tablet on Sundays, Tuesdays and Thursdays.  Continue that same dose. Recheck in 4 weeks

## 2018-05-30 ENCOUNTER — Other Ambulatory Visit: Payer: Self-pay | Admitting: Cardiology

## 2018-06-01 DIAGNOSIS — H353134 Nonexudative age-related macular degeneration, bilateral, advanced atrophic with subfoveal involvement: Secondary | ICD-10-CM | POA: Diagnosis not present

## 2018-06-01 DIAGNOSIS — H542X22 Low vision right eye category 2, low vision left eye category 2: Secondary | ICD-10-CM | POA: Diagnosis not present

## 2018-06-15 ENCOUNTER — Ambulatory Visit (INDEPENDENT_AMBULATORY_CARE_PROVIDER_SITE_OTHER): Payer: Medicare Other | Admitting: *Deleted

## 2018-06-15 DIAGNOSIS — I480 Paroxysmal atrial fibrillation: Secondary | ICD-10-CM

## 2018-06-15 DIAGNOSIS — I4891 Unspecified atrial fibrillation: Secondary | ICD-10-CM | POA: Diagnosis not present

## 2018-06-15 DIAGNOSIS — Z5181 Encounter for therapeutic drug level monitoring: Secondary | ICD-10-CM | POA: Diagnosis not present

## 2018-06-15 LAB — POCT INR: INR: 3 (ref 2.0–3.0)

## 2018-06-15 NOTE — Patient Instructions (Signed)
Continue coumadin 1 tablet daily except 1/2 tablet on Sundays, Tuesdays and Thursdays.  Recheck in 4 weeks 

## 2018-06-17 ENCOUNTER — Telehealth: Payer: Self-pay | Admitting: Cardiology

## 2018-06-17 ENCOUNTER — Emergency Department (HOSPITAL_COMMUNITY): Payer: Medicare Other

## 2018-06-17 ENCOUNTER — Encounter (HOSPITAL_COMMUNITY): Payer: Self-pay | Admitting: Emergency Medicine

## 2018-06-17 ENCOUNTER — Emergency Department (HOSPITAL_COMMUNITY)
Admission: EM | Admit: 2018-06-17 | Discharge: 2018-06-17 | Disposition: A | Discharge status: Home / Self Care | Payer: Medicare Other | Attending: Emergency Medicine | Admitting: Emergency Medicine

## 2018-06-17 ENCOUNTER — Other Ambulatory Visit: Payer: Self-pay

## 2018-06-17 DIAGNOSIS — I5023 Acute on chronic systolic (congestive) heart failure: Secondary | ICD-10-CM | POA: Insufficient documentation

## 2018-06-17 DIAGNOSIS — Z79899 Other long term (current) drug therapy: Secondary | ICD-10-CM | POA: Insufficient documentation

## 2018-06-17 DIAGNOSIS — N3001 Acute cystitis with hematuria: Secondary | ICD-10-CM | POA: Diagnosis not present

## 2018-06-17 DIAGNOSIS — I110 Hypertensive heart disease with heart failure: Secondary | ICD-10-CM | POA: Diagnosis not present

## 2018-06-17 DIAGNOSIS — R531 Weakness: Secondary | ICD-10-CM | POA: Diagnosis not present

## 2018-06-17 DIAGNOSIS — Z7901 Long term (current) use of anticoagulants: Secondary | ICD-10-CM | POA: Diagnosis not present

## 2018-06-17 LAB — URINALYSIS, ROUTINE W REFLEX MICROSCOPIC
Bilirubin Urine: NEGATIVE
GLUCOSE, UA: NEGATIVE mg/dL
HGB URINE DIPSTICK: NEGATIVE
Ketones, ur: NEGATIVE mg/dL
Nitrite: POSITIVE — AB
PROTEIN: NEGATIVE mg/dL
SPECIFIC GRAVITY, URINE: 1.005 (ref 1.005–1.030)
pH: 6 (ref 5.0–8.0)

## 2018-06-17 LAB — COMPREHENSIVE METABOLIC PANEL
ALBUMIN: 3.6 g/dL (ref 3.5–5.0)
ALK PHOS: 57 U/L (ref 38–126)
ALT: 14 U/L (ref 0–44)
ANION GAP: 11 (ref 5–15)
AST: 21 U/L (ref 15–41)
BILIRUBIN TOTAL: 1.4 mg/dL — AB (ref 0.3–1.2)
BUN: 16 mg/dL (ref 8–23)
CO2: 21 mmol/L — ABNORMAL LOW (ref 22–32)
Calcium: 8.8 mg/dL — ABNORMAL LOW (ref 8.9–10.3)
Chloride: 101 mmol/L (ref 98–111)
Creatinine, Ser: 0.86 mg/dL (ref 0.44–1.00)
GFR calc Af Amer: >60 mL/min (ref >60)
GFR calc non Af Amer: >60 mL/min (ref >60)
GLUCOSE: 175 mg/dL — AB (ref 70–99)
POTASSIUM: 3.8 mmol/L (ref 3.5–5.1)
Sodium: 133 mmol/L — ABNORMAL LOW (ref 135–145)
Total Protein: 7.8 g/dL (ref 6.5–8.1)

## 2018-06-17 LAB — CBC
HCT: 39.9 % (ref 36.0–46.0)
Hemoglobin: 13.6 g/dL (ref 12.0–15.0)
MCH: 32.3 pg (ref 26.0–34.0)
MCHC: 34.1 g/dL (ref 30.0–36.0)
MCV: 94.8 fL (ref 78.0–100.0)
PLATELETS: 223 10*3/uL (ref 150–400)
RBC: 4.21 MIL/uL (ref 3.87–5.11)
RDW: 13.1 % (ref 11.5–15.5)
WBC: 20 10*3/uL — ABNORMAL HIGH (ref 4.0–10.5)

## 2018-06-17 LAB — LIPASE, BLOOD: Lipase: 24 U/L (ref 11–51)

## 2018-06-17 LAB — DIGOXIN LEVEL: DIGOXIN LVL: 0.8 ng/mL (ref 0.8–2.0)

## 2018-06-17 LAB — BRAIN NATRIURETIC PEPTIDE: B Natriuretic Peptide: 354 pg/mL — ABNORMAL HIGH (ref 0.0–100.0)

## 2018-06-17 LAB — CBG MONITORING, ED: GLUCOSE-CAPILLARY: 183 mg/dL — AB (ref 70–99)

## 2018-06-17 LAB — TROPONIN I: TROPONIN I: 0.13 ng/mL — AB (ref <0.03)

## 2018-06-17 LAB — PROTIME-INR
INR: 2.08
Prothrombin Time: 23.2 seconds — ABNORMAL HIGH (ref 11.4–15.2)

## 2018-06-17 LAB — ETHANOL: Alcohol, Ethyl (B): <10 mg/dL (ref <10)

## 2018-06-17 MED ORDER — SODIUM CHLORIDE 0.9 % IV SOLN
1.0000 g | Freq: Once | INTRAVENOUS | Status: AC
  Administered 2018-06-17: 1 g via INTRAVENOUS
  Filled 2018-06-17: qty 10

## 2018-06-17 MED ORDER — CEPHALEXIN 500 MG PO CAPS: 500.0000 mg | ORAL_CAPSULE | Freq: Two times a day (BID) | ORAL | 0 refills | Status: AC

## 2018-06-17 MED ORDER — FUROSEMIDE 10 MG/ML IJ SOLN
60.0000 mg | Freq: Once | INTRAMUSCULAR | Status: AC
  Administered 2018-06-17: 60 mg via INTRAVENOUS
  Filled 2018-06-17: qty 6

## 2018-06-17 NOTE — ED Notes (Signed)
Patient left unit, refused discharge vital signs. Family assisted patient into car.  Family came back in to sign for discharge papers.  Patient removed IV catheter herself.

## 2018-06-17 NOTE — ED Provider Notes (Signed)
[name]   CSN: [id number] Arrival date & time: 06/17/18  1238     History   Chief Complaint Chief Complaint  Patient presents with  . Weakness    HPI Melanie PeersDorothy Wilkins is a 84 y.o. female.  HPI   Patient presents with her daughter-in-law, and eventually her son who assist with the HPI. She acknowledges multiple medical problems, states that over the past 2 days she has felt weaker than usual. She denies chest pain states that she basically always has some degree of dyspnea, does not use home oxygen 24/7. She denies fever, vomiting. She denies recent medication change, diet change.  Today she also had an episode of minimal responsiveness, described as sleepiness, with difficulty to awaken her, get responses from her.  Family members Wilkins that she is currently much closer to normal, but earlier today, was different from usual.   Past Medical History:  Diagnosis Date  . Atrial fibrillation (HCC)   . CHF (congestive heart failure) (HCC)    EF  30%  Echo 01/17/11,  EF 50% in 2014  . Chronic airway obstruction, not elsewhere classified   . Esophageal reflux   . Hernia   . Hypertension   . Hyposmolality and/or hyponatremia   . Mitral regurgitation   . Moderate tricuspid regurgitation by prior echocardiogram   . Morbid obesity (HCC)   . Osteoarthrosis, unspecified whether generalized or localized, unspecified site     Patient Active Problem List   Diagnosis Date Noted  . RBBB 02/24/2018  . Encounter for therapeutic drug monitoring 12/16/2013  . Tachycardia 01/14/2012  . Moderate tricuspid regurgitation by prior echocardiogram   . CHF (congestive heart failure) (HCC)   . Hypertension   . Morbid obesity (HCC)   . Mitral regurgitation   . Atrial fibrillation (HCC) 01/31/2011  . Long term (current) use of anticoagulants 01/31/2011    Past Surgical History:  Procedure Laterality Date  . ABDOMINAL HYSTERECTOMY    . Right lower  extremity surgery       OB History   None      Home Medications    Prior to Admission medications   Medication Sig Start Date End Date Taking? Authorizing Provider  acetaminophen (TYLENOL) 500 MG tablet Take 1,000 mg by mouth every 6 (six) hours as needed for pain.     [provider]  bacitracin ointment Apply 1 application topically 2 (two) times daily. 07/02/16   Rollene RotundaHochrein, James, MD  carvedilol (COREG) 12.5 MG tablet TAKE 1&1/2 TABLETS BY MOUTH 2 TIMES DAILY WITH A MEAL. 05/31/18   Rollene RotundaHochrein, James, MD  [facility] SENNA 8.6 MG tablet TAKE 1 TABLET BY MOUTH EVERY DAY 09/03/17   Rollene RotundaHochrein, James, MD  digoxin (LANOXIN) 0.125 MG tablet TAKE 1 TABLET (125 MCG TOTAL) BY MOUTH DAILY. 01/08/18   Rollene RotundaHochrein, James, MD  fluticasone (FLONASE) 50 MCG/ACT nasal spray SPRAY 2 SPRAYS INTO EACH NOSTRIL EVERY DAY 04/06/18   Rollene RotundaHochrein, James, MD  furosemide (LASIX) 20 MG tablet Take 1 tablet (20 mg total) by mouth daily. 11/04/17 02/24/18  Rollene RotundaHochrein, James, MD  losartan (COZAAR) 25 MG tablet TAKE 1 TABLET BY MOUTH EVERY DAY IN THE EVENING 05/10/18   Rollene RotundaHochrein, James, MD  losartan (COZAAR) 50 MG tablet TAKE 1 TABLET (50 MG TOTAL) BY MOUTH DAILY. 06/25/17   Rollene RotundaHochrein, James, MD  naproxen sodium (ALEVE) 220 MG tablet Take 220 mg by mouth daily as needed (Alternates taking this medication and Tylenol only if needed for arthritis pain).  [provider]  polyethylene glycol powder (GLYCOLAX/MIRALAX) powder Take 17 g by mouth daily as needed (for laxative/constipation). 11/19/15   Rollene Rotunda, MD  Saline (SIMPLY SALINE) 0.9 % AERS Place 1 spray into the nose daily as needed (for congestion).    [provider]  traMADol (ULTRAM) 50 MG tablet Take 50 mg by mouth every 6 (six) hours as needed.    [provider]  triamcinolone cream (KENALOG) 0.1 % APPLY AT BEDTIME 09/18/17   Rollene Rotunda, MD  UNABLE TO FIND HOVEROUND/SCOOTER USE AS DIRECTED #1 DX:CHF,ATRIAL Gabriel Earing  REGURGITATION 01/03/14   Rollene Rotunda, MD  warfarin (COUMADIN) 5 MG tablet TAKE 1/2 TO 1 TABLET DAILY AS DIRECTED BY COUMADIN CLINIC 04/23/18   Rollene Rotunda, MD    Family History Family History  Problem Relation Age of Onset  . Heart attack Mother   . Alzheimer's disease Father     Social History Social History   Tobacco Use  . Smoking status: Never Smoker  . Smokeless tobacco: Never Used  Substance Use Topics  . Alcohol use: No  . Drug use: No     Allergies   Garlic and Penicillins   Review of Systems Review of Systems  Constitutional:       Per HPI, otherwise negative  HENT:       Per HPI, otherwise negative  Respiratory:       Per HPI, otherwise negative  Cardiovascular:       Per HPI, otherwise negative  Gastrointestinal: Negative for vomiting.  Endocrine:       Negative aside from HPI  Genitourinary:       Neg aside from HPI   Musculoskeletal:       Per HPI, otherwise negative  Skin: Negative.   Neurological: Positive for weakness and light-headedness. Negative for syncope.     Physical Exam Updated Vital Signs BP 138/74 (BP Location: Right Arm)   Pulse 69   Temp 98.5 F (36.9 C) (Oral)   Resp 17   Wt 78.9 kg   SpO2 96%   BMI 27.24 kg/m    Physical Exam  Constitutional: She is oriented to person, place, and time. No distress.  HENT:  Head: Normocephalic and atraumatic.  Eyes: Conjunctivae and EOM are normal.  Cardiovascular: Normal rate. An irregularly irregular rhythm present.  Pulmonary/Chest: She has decreased breath sounds.  Abdominal: She exhibits no distension.  Musculoskeletal: She exhibits edema. She exhibits no deformity.  Neurological: She is alert and oriented to person, place, and time. No cranial nerve deficit.  Skin: Skin is warm and dry.     Psychiatric: She has a normal mood and affect.  Nursing Wilkins and vitals reviewed.    ED Treatments / Results  Labs (all labs ordered are listed, but only abnormal results are  displayed) Labs Reviewed  CBC - Abnormal; Notable for the following components:      Result Value   WBC 20.0 (*)    All other components within normal limits  URINALYSIS, ROUTINE W REFLEX MICROSCOPIC - Abnormal; Notable for the following components:   Color, Urine STRAW (*)    Nitrite POSITIVE (*)    Leukocytes, UA TRACE (*)    Bacteria, UA RARE (*)    All other components within normal limits  BRAIN NATRIURETIC PEPTIDE - Abnormal; Notable for the following components:   B Natriuretic Peptide 354.0 (*)    All other components within normal limits  COMPREHENSIVE METABOLIC PANEL - Abnormal; Notable for the following components:  Sodium 133 (*)    CO2 21 (*)    Glucose, Bld 175 (*)    Calcium 8.8 (*)    Total Bilirubin 1.4 (*)    All other components within normal limits  TROPONIN I - Abnormal; Notable for the following components:   Troponin I 0.13 (*)    All other components within normal limits  PROTIME-INR - Abnormal; Notable for the following components:   Prothrombin Time 23.2 (*)    All other components within normal limits  CBG MONITORING, ED - Abnormal; Notable for the following components:   Glucose-Capillary 183 (*)    All other components within normal limits  LIPASE, BLOOD  ETHANOL  DIGOXIN LEVEL    EKG EKG Interpretation  Date/Time:  Thursday June 17 2018 13:01:27 EDT Ventricular Rate:  67 PR Interval:    QRS Duration: 177 QT Interval:  443 QTC Calculation: 468 R Axis:   -30 Text Interpretation:  Atrial fibrillation Right bundle branch block ST-t wave abnormality Abnormal ekg Confirmed by Gerhard Munch [id number]) on 06/17/2018 1:06:35 PM   Radiology Dg Chest 2 View  Result Date: 06/17/2018 CLINICAL DATA:  Weakness EXAM: CHEST - 2 VIEW COMPARISON:  01/21/2011 FINDINGS: Moderate-to-marked cardiomegaly again noted. Mild peribronchial thickening again noted. There is no evidence of focal airspace disease, pulmonary edema, suspicious pulmonary nodule/mass,  pleural effusion, or pneumothorax. No acute bony abnormalities are identified. IMPRESSION: Cardiomegaly without evidence of acute cardiopulmonary disease. Electronically Signed   By: Harmon Pier M.D.   On: 06/17/2018 13:51    Procedures Procedures (including critical care time)  Medications Ordered in ED Medications  furosemide (LASIX) injection 60 mg (60 mg Intravenous Given 06/17/18 1454)  cefTRIAXone (ROCEPHIN) 1 g in sodium chloride 0.9 % 100 mL IVPB (0 g Intravenous Stopped 06/17/18 1728)     Initial Impression / Assessment and Plan / ED Course  I have reviewed the triage vital signs and the nursing notes.  Pertinent labs & imaging results that were available during my care of the patient were reviewed by me and considered in my medical decision making (see chart for details).     On initial repeat exam the patient remains awake and alert. She has persistent A. fib, though no tachycardia, she is not hypotensive. Patient is aware of initial findings including concern for worsening heart failure, possible UTI.  Patient has received IV Lasix.  I reviewed the patient's chart including echocardiogram from 2 years ago, as below  Study Conclusions   - Left ventricle: The cavity size was normal. There was mild   concentric hypertrophy. Systolic function was normal. The   estimated ejection fraction was in the range of 55% to 60%. The   study was not technically sufficient to allow evaluation of LV   diastolic dysfunction due to atrial fibrillation. - Regional wall motion abnormality: Hypokinesis of the mid inferior   and mid inferolateral myocardium. - Aortic valve: Mildly to moderately calcified annulus. Trileaflet;   mildly thickened leaflets. There was mild regurgitation. - Mitral valve: Mildly calcified annulus. Mildly thickened leaflets   . There was mild regurgitation. - Left atrium: The atrium was moderately dilated. - Right ventricle: Systolic function was mildly reduced. -  Right atrium: The atrium was mildly dilated. - Tricuspid valve: There was mild regurgitation.   Transthoracic echocardiography.  M-mode, complete 2D, spectral Doppler, and color Doppler.  Birthdate:  Patient birthdate: 28-Aug-1932.  Age:  Patient is 84 yr old.  Sex:  Gender: female. BMI: 27.6 kg/m^2.  Blood  pressure:     148/94  Patient status: Outpatient.  Study date:  Study date: 12/20/2015. Study time: 11:09 AM.    After the patient received IV Lasix she began to feel better. Remaining labs notable for elevated troponin, substantial elevation in BNP.  Likelihood of demand ischemia secondary to worsening heart failure considered, discussed.  I had a lengthy conversation with the patient and her son about concern for worsening heart failure, advocated for recommended admission for further evaluation and management. Patient not willing to stay in the hospital. Patient has capacity to make this decision.  Patient is amenable to outpatient regimen of increased diuretics, close outpatient follow-up with cardiology Patient also found to have UTI, which may have contributed to her episode of listlessness. However, here the patient is been awake, alert, interactive throughout, demonstrating capacity to make medical decisions. Patient discharged per request with close outpatient follow-up.  Final Clinical Impressions(s) / ED Diagnoses   Final diagnoses:  Acute on chronic systolic congestive heart failure (HCC)  Acute cystitis with hematuria   CRITICAL CARE Performed by: Gerhard Munch Total critical care time: 35 minutes Critical care time was exclusive of separately billable procedures and treating other patients. Critical care was necessary to treat or prevent imminent or life-threatening deterioration. Critical care was time spent personally by me on the following activities: development of treatment plan with patient and/or surrogate as well as nursing, discussions with consultants,  evaluation of patient's response to treatment, examination of patient, obtaining history from patient or surrogate, ordering and performing treatments and interventions, ordering and review of laboratory studies, ordering and review of radiographic studies, pulse oximetry and re-evaluation of patient's condition.   Gerhard Munch, MD 06/17/18 [phone]

## 2018-06-17 NOTE — Discharge Instructions (Addendum)
As discussed, it is important that you take all medication as directed, and be sure to follow-up with your cardiologist.  In addition to the prescribed antibiotic, please take two 20 mg Lasix tablets each day for the next five days (you are currently taking one each day).  Return here for any concerning changes in your condition.

## 2018-06-17 NOTE — ED Notes (Signed)
Date and time results received: 06/17/18 1354 (use smartphrase ".now" to insert current time)  Test: TROP Critical Value: 0.13  Name of Provider Notified: lockwood  Orders Received? Or Actions Taken?: none

## 2018-06-17 NOTE — Telephone Encounter (Signed)
Spoke with pt's daughter in Social workerlaw.  She sts that the pt is lethargic, has fell x2 today, her legs are swollen and discolored, and generalized pain.  Adv her to call 911 now.  She sts that the pt is refusing to be transported by ambulance. Adv her that the pt needs to go to the ED asap. Her daughter in law is agreeable.  The pt's daughter in law and son will transport the pt by car to [facility] ED now.  FYI update fwd to Dr.Hochrein.

## 2018-06-17 NOTE — Telephone Encounter (Signed)
New message:      Care taker/daughter-in-law is calling and states the pt is very disoriented and very fatigue. She states the pt is just very fatigue and feeling sore in her body. She states the pt fell 2 x times before she got there this morning and she has been having a dry mouth.

## 2018-06-17 NOTE — ED Notes (Signed)
Date and time results received: 06/17/18 1400 (use smartphrase ".now" to insert current time)  Test: troponin Critical Value: 0.13  Name of Provider Notified: Dr. Jeraldine LootsLockwood  Orders Received? Or Actions Taken?: yes

## 2018-06-17 NOTE — ED Triage Notes (Signed)
Pt comes in with family who states pt had 2 falls this morning and was c/o being cold. Now pt is increasingly fatigued and lethargic per family.

## 2018-06-21 NOTE — Telephone Encounter (Signed)
Agree with discussion

## 2018-06-24 ENCOUNTER — Other Ambulatory Visit: Payer: Self-pay | Admitting: Cardiology

## 2018-06-24 DIAGNOSIS — K5900 Constipation, unspecified: Secondary | ICD-10-CM

## 2018-06-24 NOTE — Telephone Encounter (Signed)
OK to fill

## 2018-06-25 NOTE — Telephone Encounter (Signed)
Rx has been sent to the pharmacy electronically. ° °

## 2018-06-27 ENCOUNTER — Other Ambulatory Visit: Payer: Self-pay | Admitting: Cardiology

## 2018-07-01 ENCOUNTER — Other Ambulatory Visit: Payer: Self-pay | Admitting: Cardiology

## 2018-07-03 ENCOUNTER — Other Ambulatory Visit: Payer: Self-pay | Admitting: Cardiology

## 2018-07-12 DIAGNOSIS — H353134 Nonexudative age-related macular degeneration, bilateral, advanced atrophic with subfoveal involvement: Secondary | ICD-10-CM | POA: Diagnosis not present

## 2018-07-22 ENCOUNTER — Ambulatory Visit (INDEPENDENT_AMBULATORY_CARE_PROVIDER_SITE_OTHER): Payer: Medicare Other | Admitting: Pharmacist

## 2018-07-22 DIAGNOSIS — I480 Paroxysmal atrial fibrillation: Secondary | ICD-10-CM

## 2018-07-22 DIAGNOSIS — Z5181 Encounter for therapeutic drug level monitoring: Secondary | ICD-10-CM

## 2018-07-22 LAB — POCT INR: INR: 5 — AB (ref 2.0–3.0)

## 2018-07-22 NOTE — Patient Instructions (Signed)
Description   Skip warfarin tomorrow and Saturday (since already took today) then continue coumadin 1 tablet daily except 1/2 tablet on Sundays, Tuesdays and Thursdays.   Recheck in 10 days

## 2018-08-03 ENCOUNTER — Ambulatory Visit (INDEPENDENT_AMBULATORY_CARE_PROVIDER_SITE_OTHER): Payer: Medicare Other | Admitting: *Deleted

## 2018-08-03 DIAGNOSIS — Z5181 Encounter for therapeutic drug level monitoring: Secondary | ICD-10-CM

## 2018-08-03 DIAGNOSIS — I480 Paroxysmal atrial fibrillation: Secondary | ICD-10-CM

## 2018-08-03 DIAGNOSIS — I4891 Unspecified atrial fibrillation: Secondary | ICD-10-CM | POA: Diagnosis not present

## 2018-08-03 LAB — POCT INR: INR: 3.6 — AB (ref 2.0–3.0)

## 2018-08-03 NOTE — Patient Instructions (Signed)
Take coumadin 1/2 tablet tomorrow then decrease dose to 1/2 tablet daily except 1 tablet on Mondays, Wednesdays and Fridays.   Recheck in 3 weeks

## 2018-08-18 ENCOUNTER — Ambulatory Visit: Payer: Medicare Other | Admitting: Cardiology

## 2018-08-19 ENCOUNTER — Other Ambulatory Visit: Payer: Self-pay | Admitting: Cardiology

## 2018-08-26 ENCOUNTER — Encounter (INDEPENDENT_AMBULATORY_CARE_PROVIDER_SITE_OTHER): Payer: Self-pay

## 2018-08-26 ENCOUNTER — Ambulatory Visit (INDEPENDENT_AMBULATORY_CARE_PROVIDER_SITE_OTHER): Payer: Medicare Other | Admitting: *Deleted

## 2018-08-26 DIAGNOSIS — I480 Paroxysmal atrial fibrillation: Secondary | ICD-10-CM

## 2018-08-26 DIAGNOSIS — I4891 Unspecified atrial fibrillation: Secondary | ICD-10-CM

## 2018-08-26 DIAGNOSIS — Z5181 Encounter for therapeutic drug level monitoring: Secondary | ICD-10-CM

## 2018-08-26 LAB — POCT INR: INR: 3.6 — AB (ref 2.0–3.0)

## 2018-08-26 NOTE — Patient Instructions (Signed)
Took coumadin this morning. Hold coumadin tomorrow then decrease dose to 1/2 tablet daily except 1 tablet on Mondays and Thursdays   Recheck in 3 weeks

## 2018-08-30 NOTE — Progress Notes (Deleted)
HPI The patient presents for follow up of her atrial fibrillation and cardiomyopathy.  She was in the ED in August with edema and was given IV Lasix.  I reviewed these records for this visit.   She presents for follow up.  ***  At the last visit she had increasing lower extremity swelling.  I started Lasix.  She is done well since I last saw her.  She lives in an apartment and walks with a walker.  She says that her leg swelling is much improved.  The patient denies any new symptoms such as chest discomfort, neck or arm discomfort. There has been no new shortness of breath, PND or orthopnea. There have been no reported palpitations, presyncope or syncope.      Allergies  Allergen Reactions  . Garlic Rash  . Penicillins     Current Outpatient Medications  Medication Sig Dispense Refill  . acetaminophen (TYLENOL) 500 MG tablet Take 1,000 mg by mouth every 6 (six) hours as needed for pain.     . carvedilol (COREG) 12.5 MG tablet TAKE 1&1/2 TABLETS BY MOUTH 2 TIMES DAILY WITH A MEAL. (Patient taking differently: Take 18.75 mg by mouth 2 (two) times daily with a meal. ) 90 tablet 3  . [facility] SENNA 8.6 MG tablet TAKE 1 TABLET BY MOUTH EVERY DAY 30 tablet 8  . digoxin (LANOXIN) 0.125 MG tablet TAKE 1 TABLET (125 MCG TOTAL) BY MOUTH DAILY. 90 tablet 1  . fluticasone (FLONASE) 50 MCG/ACT nasal spray SPRAY 2 SPRAYS INTO EACH NOSTRIL EVERY DAY (Patient taking differently: Place 2 sprays into both nostrils daily. ) 16 g 2  . furosemide (LASIX) 20 MG tablet Take 1 tablet (20 mg total) by mouth daily. 90 tablet 3  . losartan (COZAAR) 25 MG tablet TAKE 1 TABLET BY MOUTH EVERY DAY IN THE EVENING 90 tablet 1  . losartan (COZAAR) 50 MG tablet TAKE 1 TABLET (50 MG TOTAL) BY MOUTH DAILY. 90 tablet 3  . Multiple Vitamins-Minerals (PRESERVISION AREDS 2+MULTI VIT PO) Take 1 capsule by mouth 2 (two) times daily.    . polyethylene glycol powder (GLYCOLAX/MIRALAX) powder Take 17 g by mouth daily as needed (for  laxative/constipation). 527 g 3  . Saline (SIMPLY SALINE) 0.9 % AERS Place 1 spray into the nose daily as needed (for congestion).    . triamcinolone cream (KENALOG) 0.1 % APPLY AT BEDTIME (Patient taking differently: Apply 1 application topically at bedtime. ) 454 g 3  . UNABLE TO FIND HOVEROUND/SCOOTER USE AS DIRECTED #1 DX:CHF,ATRIAL FIBRILLATION,MITRAL REGURGITATION 1 each 0  . warfarin (COUMADIN) 5 MG tablet TAKE 1/2 TO 1 TABLET DAILY AS DIRECTED BY COUMADIN CLINIC (Patient taking differently: Take 2.5-5 mg by mouth See admin instructions. Alternate taking 2.5mg  one night with 5mg  the next night as directed daily) 90 tablet 1   No current facility-administered medications for this visit.     Past Medical History:  Diagnosis Date  . Atrial fibrillation (HCC)   . CHF (congestive heart failure) (HCC)    EF  30%  Echo 01/17/11,  EF 50% in 2014  . Chronic airway obstruction, not elsewhere classified   . Esophageal reflux   . Hernia   . Hypertension   . Hyposmolality and/or hyponatremia   . Mitral regurgitation   . Moderate tricuspid regurgitation by prior echocardiogram   . Morbid obesity (HCC)   . Osteoarthrosis, unspecified whether generalized or localized, unspecified site     Past Surgical History:  Procedure Laterality  Date  . ABDOMINAL HYSTERECTOMY    . Right lower extremity surgery       ROS:    ***   PHYSICAL EXAM There were no vitals taken for this visit.  GENERAL:  Well appearing NECK:  No jugular venous distention, waveform within normal limits, carotid upstroke brisk and symmetric, no bruits, no thyromegaly LUNGS:  Clear to auscultation bilaterally CHEST:  Unremarkable HEART:  PMI not displaced or sustained,S1 and S2 within normal limits, no S3, no S4, no clicks, no rubs, *** murmurs ABD:  Flat, positive bowel sounds normal in frequency in pitch, no bruits, no rebound, no guarding, no midline pulsatile mass, no hepatomegaly, no splenomegaly EXT:  2 plus pulses  throughout, no edema, no cyanosis no clubbing    ***PHYSICAL EXAM GEN:  No distress NECK:  No jugular venous distention at 90 degrees, waveform within normal limits, carotid upstroke brisk and symmetric, no bruits, no thyromegaly LUNGS:  Clear to auscultation bilaterally CHEST:  Unremarkable HEART:  S1 and S2 within normal limits, no S3, no clicks, no rubs, no murmurs, irregular ABD:  Positive bowel sounds normal in frequency in pitch, no bruits, no rebound, no guarding, unable to assess midline mass or bruit with the patient seated. EXT:  2 plus pulses throughout, moderate edema, no cyanosis no clubbing    ASSESSMENT AND PLAN  Atrial fibrillation -   Ms. Vassie Kugel has a CHA2DS2 - VASc score of 4 with a risk of stroke of 4%.  ***  I will check a CBC.  No change in therapy.   Hypertension -  The blood pressure is *** at target. No change in medications is indicated. We will continue with therapeutic lifestyle changes (TLC).  I will check a BMET.   Cardiomyopathy - Her EF in 2017 was only mildly reduced.  ***  She had had a previous reduced ejection fraction but last year her EF was 50-55%.  No further imaging.   RBBB - This is chronic.    ***  EDEMA - ***

## 2018-09-01 ENCOUNTER — Other Ambulatory Visit: Payer: Self-pay

## 2018-09-01 ENCOUNTER — Ambulatory Visit: Payer: Medicare Other | Admitting: Cardiology

## 2018-09-01 MED ORDER — TRIAMCINOLONE ACETONIDE 0.1 % EX CREA: TOPICAL_CREAM | Freq: Every day | CUTANEOUS | 3 refills | Status: AC

## 2018-09-01 NOTE — Telephone Encounter (Signed)
Please advise refill on Kenalog ointment.

## 2018-09-26 ENCOUNTER — Other Ambulatory Visit: Payer: Self-pay | Admitting: Cardiology

## 2018-09-28 ENCOUNTER — Ambulatory Visit (INDEPENDENT_AMBULATORY_CARE_PROVIDER_SITE_OTHER): Payer: Medicare Other | Admitting: *Deleted

## 2018-09-28 DIAGNOSIS — I4891 Unspecified atrial fibrillation: Secondary | ICD-10-CM

## 2018-09-28 DIAGNOSIS — I480 Paroxysmal atrial fibrillation: Secondary | ICD-10-CM | POA: Diagnosis not present

## 2018-09-28 DIAGNOSIS — Z5181 Encounter for therapeutic drug level monitoring: Secondary | ICD-10-CM

## 2018-09-28 LAB — POCT INR: INR: 3.4 — AB (ref 2.0–3.0)

## 2018-09-28 NOTE — Patient Instructions (Signed)
Took coumadin this morning. Hold coumadin tomorrow then decrease dose to 1/2 tablet daily except 1 tablet on Mondays Recheck in 4 weeks

## 2018-09-28 NOTE — Telephone Encounter (Signed)
Rx(s) sent to pharmacy electronically.  

## 2018-10-05 NOTE — Progress Notes (Signed)
HPI The patient presents for follow up of her atrial fibrillation and cardiomyopathy.  Since I last saw her she was in the ED.  I reviewed these records for this visit.   She had AMS.  She had some suggestion of acute pulmonary edema.  She did not want to be hospitalized.   Her biggest complaint that day was altered mental status.  She was treated for urinary infection.  She also was treated with Lasix increased dose for several days.  She said that since then she has felt okay.  She has had no further lower extremity swelling.  She denies any chest pressure, neck or arm discomfort.  Said no palpitations, presyncope or syncope.  She gets around slowly with her walker in her house.   Allergies  Allergen Reactions  . Garlic Rash  . Penicillins     Current Outpatient Medications  Medication Sig Dispense Refill  . acetaminophen (TYLENOL) 500 MG tablet Take 1,000 mg by mouth every 6 (six) hours as needed for pain.     . carvedilol (COREG) 12.5 MG tablet TAKE 1&1/2 TABLETS BY MOUTH 2 TIMES DAILY WITH A MEAL. 90 tablet 6  . [facility] SENNA 8.6 MG tablet TAKE 1 TABLET BY MOUTH EVERY DAY 30 tablet 8  . fluticasone (FLONASE) 50 MCG/ACT nasal spray SPRAY 2 SPRAYS INTO EACH NOSTRIL EVERY DAY 16 g 3  . furosemide (LASIX) 20 MG tablet Take 1 tablet (20 mg total) by mouth daily. 90 tablet 3  . losartan (COZAAR) 25 MG tablet Take 25 mg by mouth daily as needed.    Marland Kitchen losartan (COZAAR) 50 MG tablet TAKE 1 TABLET (50 MG TOTAL) BY MOUTH DAILY. 90 tablet 3  . Multiple Vitamins-Minerals (PRESERVISION AREDS 2+MULTI VIT PO) Take 1 capsule by mouth 2 (two) times daily.    . polyethylene glycol powder (GLYCOLAX/MIRALAX) powder Take 17 g by mouth daily as needed (for laxative/constipation). 527 g 3  . Saline (SIMPLY SALINE) 0.9 % AERS Place 1 spray into the nose daily as needed (for congestion).    . triamcinolone cream (KENALOG) 0.1 % Apply topically at bedtime. 454 g 3  . UNABLE TO FIND HOVEROUND/SCOOTER USE AS  DIRECTED #1 DX:CHF,ATRIAL FIBRILLATION,MITRAL REGURGITATION 1 each 0  . warfarin (COUMADIN) 5 MG tablet TAKE 1/2 TO 1 TABLET DAILY AS DIRECTED BY COUMADIN CLINIC (Patient taking differently: Take 2.5-5 mg by mouth See admin instructions. Alternate taking 2.5mg  one night with 5mg  the next night as directed daily) 90 tablet 1   No current facility-administered medications for this visit.     Past Medical History:  Diagnosis Date  . Atrial fibrillation (HCC)   . CHF (congestive heart failure) (HCC)    EF  30%  Echo 01/17/11,  EF 50% in 2014  . Chronic airway obstruction, not elsewhere classified   . Esophageal reflux   . Hernia   . Hypertension   . Hyposmolality and/or hyponatremia   . Mitral regurgitation   . Moderate tricuspid regurgitation by prior echocardiogram   . Morbid obesity (HCC)   . Osteoarthrosis, unspecified whether generalized or localized, unspecified site     Past Surgical History:  Procedure Laterality Date  . ABDOMINAL HYSTERECTOMY    . Right lower extremity surgery     ROS:    As stated in the HPI and negative for all other systems.  PHYSICAL EXAM BP (!) 180/90   Pulse 65   Ht 5\' 7"  (1.702 m)   Wt 170 lb (77.1 kg)  BMI 26.63 kg/m   PHYSICAL EXAM GEN:  No distress NECK:  No jugular venous distention at 90 degrees, waveform within normal limits, carotid upstroke brisk and symmetric, no bruits, no thyromegaly LYMPHATICS:  No cervical adenopathy LUNGS:  Clear to auscultation bilaterally BACK:  No CVA tenderness CHEST:  Unremarkable HEART:  S1 and S2 within normal limits, no S3, no clicks, no rubs, no murmurs, irregular ABD:  Positive bowel sounds normal in frequency in pitch, no bruits, no rebound, no guarding, unable to assess midline mass or bruit with the patient seated. EXT:  2 plus pulses throughout, mild edema, no cyanosis no clubbing SKIN:  No rashes no nodules NEURO:  Cranial nerves II through XII grossly intact, motor grossly intact  throughout PSYCH:  Cognitively intact, oriented to person place and time   EKG: Atrial fibrillation, right bundle branch block, no acute ST-T wave changes.  ASSESSMENT AND PLAN  Atrial fibrillation -   [name] has a CHA2DS2 - VASc score of 4 with a risk of stroke of 4%.  She tolerates anticoagulation.  Stop digoxin because her rate seems to be slow and I like to reduce medications as possible.   Hypertension -  BP is controlled.  She will continue the meds as listed.  Acute on chronic diastolic HF- She seems to be euvolemic.  I may continue the low-dose diuretic.  I will check a basic metabolic profile and a CBC. Marland Kitchen.   RBBB - This is chronic.  No change in therapy.  EDEMA - Patient has trace edema.  This will be managed as above.

## 2018-10-06 ENCOUNTER — Encounter: Payer: Self-pay | Admitting: Cardiology

## 2018-10-06 ENCOUNTER — Ambulatory Visit (INDEPENDENT_AMBULATORY_CARE_PROVIDER_SITE_OTHER): Payer: Medicare Other | Admitting: Cardiology

## 2018-10-06 VITALS — BP 180/90 | HR 65 | Ht 67.0 in | Wt 170.0 lb

## 2018-10-06 DIAGNOSIS — I480 Paroxysmal atrial fibrillation: Secondary | ICD-10-CM | POA: Diagnosis not present

## 2018-10-06 DIAGNOSIS — I4510 Unspecified right bundle-branch block: Secondary | ICD-10-CM | POA: Diagnosis not present

## 2018-10-06 DIAGNOSIS — I5033 Acute on chronic diastolic (congestive) heart failure: Secondary | ICD-10-CM

## 2018-10-06 DIAGNOSIS — I10 Essential (primary) hypertension: Secondary | ICD-10-CM | POA: Diagnosis not present

## 2018-10-06 DIAGNOSIS — Z79899 Other long term (current) drug therapy: Secondary | ICD-10-CM | POA: Diagnosis not present

## 2018-10-06 MED ORDER — FLUTICASONE PROPIONATE 50 MCG/ACT NA SUSP: NASAL | 3 refills | Status: DC

## 2018-10-06 NOTE — Patient Instructions (Addendum)
Medication Instructions:  Please discontinue your Digoxin. Continue all other medications as listed.  If you need a refill on your cardiac medications before your next appointment, please call your pharmacy.   Lab work: Please have lab work at [facility]. (BMP/CBC)  If you have labs (blood work) drawn today and your tests are completely normal, you will receive your results only by: Marland Kitchen. MyChart Message (if you have MyChart) OR . A paper copy in the mail If you have any lab test that is abnormal or we need to change your treatment, we will call you to review the results.  Follow-Up: Follow up in 6 months with Dr. Antoine PocheHochrein.  You will receive a letter in the mail 2 months before you are due.  Please call us when you receive this letter to schedule your follow up appointment.  Thank you for choosing [facility]!!

## 2018-10-22 ENCOUNTER — Other Ambulatory Visit: Payer: Self-pay | Admitting: Cardiology

## 2018-10-29 ENCOUNTER — Other Ambulatory Visit: Payer: Self-pay | Admitting: Cardiology

## 2018-11-02 ENCOUNTER — Ambulatory Visit (INDEPENDENT_AMBULATORY_CARE_PROVIDER_SITE_OTHER): Payer: Medicare Other | Admitting: Pharmacist

## 2018-11-02 DIAGNOSIS — Z5181 Encounter for therapeutic drug level monitoring: Secondary | ICD-10-CM

## 2018-11-02 DIAGNOSIS — I480 Paroxysmal atrial fibrillation: Secondary | ICD-10-CM

## 2018-11-02 LAB — POCT INR: INR: 1.8 — AB (ref 2.0–3.0)

## 2018-11-02 NOTE — Patient Instructions (Signed)
Description   Took coumadin this morning. Take an extra 1/2 tablet today when you get home and a whole tablet tomorrow then resume 1/2 tablet daily except 1 tablet on Mondays Recheck in 3 weeks

## 2018-11-18 ENCOUNTER — Encounter: Payer: Self-pay | Admitting: Cardiology

## 2018-11-18 DIAGNOSIS — I10 Essential (primary) hypertension: Secondary | ICD-10-CM | POA: Diagnosis not present

## 2018-11-18 DIAGNOSIS — Z79899 Other long term (current) drug therapy: Secondary | ICD-10-CM | POA: Diagnosis not present

## 2018-12-16 ENCOUNTER — Ambulatory Visit (INDEPENDENT_AMBULATORY_CARE_PROVIDER_SITE_OTHER): Payer: Medicare Other | Admitting: *Deleted

## 2018-12-16 DIAGNOSIS — I480 Paroxysmal atrial fibrillation: Secondary | ICD-10-CM | POA: Diagnosis not present

## 2018-12-16 DIAGNOSIS — Z5181 Encounter for therapeutic drug level monitoring: Secondary | ICD-10-CM | POA: Diagnosis not present

## 2018-12-16 LAB — POCT INR: INR: 1.7 — AB (ref 2.0–3.0)

## 2018-12-16 NOTE — Patient Instructions (Signed)
Took coumadin this morning. Take 1 extra coumadin when you get home then increase dose to 1/2 tablet daily except 1 tablet on Tuesdays and Saturdays  Recheck in 3 weeks

## 2018-12-20 ENCOUNTER — Telehealth: Payer: Self-pay | Admitting: Cardiology

## 2018-12-20 NOTE — Telephone Encounter (Signed)
FYI to be on the lookout.  Thanks!

## 2018-12-20 NOTE — Telephone Encounter (Signed)
New message   Lelon Mast from [facility] will be faxing over a Certificate of Medical Necessity for patients incontinence products.

## 2018-12-21 NOTE — Telephone Encounter (Signed)
Medical necessity received with await Dr Antoine Poche signature.

## 2018-12-24 ENCOUNTER — Emergency Department (HOSPITAL_COMMUNITY): Payer: Medicare Other

## 2018-12-24 ENCOUNTER — Observation Stay (HOSPITAL_COMMUNITY): Payer: Medicare Other

## 2018-12-24 ENCOUNTER — Other Ambulatory Visit: Payer: Self-pay

## 2018-12-24 ENCOUNTER — Inpatient Hospital Stay (HOSPITAL_COMMUNITY)
Admission: EM | Admit: 2018-12-24 | Discharge: 2018-12-28 | DRG: 281 | Disposition: A | Discharge status: Home / Self Care | Payer: Medicare Other | Attending: Internal Medicine | Admitting: Internal Medicine

## 2018-12-24 ENCOUNTER — Encounter (HOSPITAL_COMMUNITY): Payer: Self-pay | Admitting: Emergency Medicine

## 2018-12-24 DIAGNOSIS — I4510 Unspecified right bundle-branch block: Secondary | ICD-10-CM | POA: Diagnosis present

## 2018-12-24 DIAGNOSIS — R079 Chest pain, unspecified: Secondary | ICD-10-CM | POA: Diagnosis not present

## 2018-12-24 DIAGNOSIS — I5033 Acute on chronic diastolic (congestive) heart failure: Secondary | ICD-10-CM | POA: Diagnosis not present

## 2018-12-24 DIAGNOSIS — R0602 Shortness of breath: Secondary | ICD-10-CM | POA: Diagnosis not present

## 2018-12-24 DIAGNOSIS — M1990 Unspecified osteoarthritis, unspecified site: Secondary | ICD-10-CM | POA: Diagnosis present

## 2018-12-24 DIAGNOSIS — I361 Nonrheumatic tricuspid (valve) insufficiency: Secondary | ICD-10-CM | POA: Diagnosis not present

## 2018-12-24 DIAGNOSIS — I714 Abdominal aortic aneurysm, without rupture: Secondary | ICD-10-CM | POA: Diagnosis not present

## 2018-12-24 DIAGNOSIS — J449 Chronic obstructive pulmonary disease, unspecified: Secondary | ICD-10-CM | POA: Diagnosis present

## 2018-12-24 DIAGNOSIS — I482 Chronic atrial fibrillation, unspecified: Secondary | ICD-10-CM | POA: Diagnosis not present

## 2018-12-24 DIAGNOSIS — I5032 Chronic diastolic (congestive) heart failure: Secondary | ICD-10-CM | POA: Diagnosis present

## 2018-12-24 DIAGNOSIS — I083 Combined rheumatic disorders of mitral, aortic and tricuspid valves: Secondary | ICD-10-CM | POA: Diagnosis present

## 2018-12-24 DIAGNOSIS — Y92230 Patient room in hospital as the place of occurrence of the external cause: Secondary | ICD-10-CM | POA: Diagnosis not present

## 2018-12-24 DIAGNOSIS — Z6829 Body mass index (BMI) 29.0-29.9, adult: Secondary | ICD-10-CM | POA: Diagnosis not present

## 2018-12-24 DIAGNOSIS — Z79899 Other long term (current) drug therapy: Secondary | ICD-10-CM | POA: Diagnosis not present

## 2018-12-24 DIAGNOSIS — R0789 Other chest pain: Secondary | ICD-10-CM | POA: Diagnosis not present

## 2018-12-24 DIAGNOSIS — K219 Gastro-esophageal reflux disease without esophagitis: Secondary | ICD-10-CM | POA: Diagnosis present

## 2018-12-24 DIAGNOSIS — Z91018 Allergy to other foods: Secondary | ICD-10-CM | POA: Diagnosis not present

## 2018-12-24 DIAGNOSIS — I5021 Acute systolic (congestive) heart failure: Secondary | ICD-10-CM

## 2018-12-24 DIAGNOSIS — I110 Hypertensive heart disease with heart failure: Secondary | ICD-10-CM | POA: Diagnosis present

## 2018-12-24 DIAGNOSIS — R110 Nausea: Secondary | ICD-10-CM | POA: Diagnosis not present

## 2018-12-24 DIAGNOSIS — Z7901 Long term (current) use of anticoagulants: Secondary | ICD-10-CM

## 2018-12-24 DIAGNOSIS — R410 Disorientation, unspecified: Secondary | ICD-10-CM

## 2018-12-24 DIAGNOSIS — Z820 Family history of epilepsy and other diseases of the nervous system: Secondary | ICD-10-CM

## 2018-12-24 DIAGNOSIS — I4819 Other persistent atrial fibrillation: Secondary | ICD-10-CM | POA: Diagnosis not present

## 2018-12-24 DIAGNOSIS — I255 Ischemic cardiomyopathy: Secondary | ICD-10-CM | POA: Diagnosis not present

## 2018-12-24 DIAGNOSIS — Z90710 Acquired absence of both cervix and uterus: Secondary | ICD-10-CM

## 2018-12-24 DIAGNOSIS — Z66 Do not resuscitate: Secondary | ICD-10-CM | POA: Diagnosis present

## 2018-12-24 DIAGNOSIS — R1013 Epigastric pain: Secondary | ICD-10-CM | POA: Diagnosis present

## 2018-12-24 DIAGNOSIS — Z5329 Procedure and treatment not carried out because of patient's decision for other reasons: Secondary | ICD-10-CM | POA: Diagnosis not present

## 2018-12-24 DIAGNOSIS — Z886 Allergy status to analgesic agent status: Secondary | ICD-10-CM | POA: Diagnosis not present

## 2018-12-24 DIAGNOSIS — I4891 Unspecified atrial fibrillation: Secondary | ICD-10-CM | POA: Diagnosis present

## 2018-12-24 DIAGNOSIS — T450X5A Adverse effect of antiallergic and antiemetic drugs, initial encounter: Secondary | ICD-10-CM | POA: Diagnosis not present

## 2018-12-24 DIAGNOSIS — Z880 Allergy status to penicillin: Secondary | ICD-10-CM

## 2018-12-24 DIAGNOSIS — Z515 Encounter for palliative care: Secondary | ICD-10-CM | POA: Diagnosis not present

## 2018-12-24 DIAGNOSIS — E669 Obesity, unspecified: Secondary | ICD-10-CM | POA: Diagnosis present

## 2018-12-24 DIAGNOSIS — Z8249 Family history of ischemic heart disease and other diseases of the circulatory system: Secondary | ICD-10-CM

## 2018-12-24 DIAGNOSIS — I214 Non-ST elevation (NSTEMI) myocardial infarction: Secondary | ICD-10-CM | POA: Diagnosis not present

## 2018-12-24 DIAGNOSIS — I509 Heart failure, unspecified: Secondary | ICD-10-CM | POA: Diagnosis not present

## 2018-12-24 LAB — COMPREHENSIVE METABOLIC PANEL
ALT: 16 U/L (ref 0–44)
AST: 37 U/L (ref 15–41)
Albumin: 3.9 g/dL (ref 3.5–5.0)
Alkaline Phosphatase: 67 U/L (ref 38–126)
Anion gap: 10 (ref 5–15)
BILIRUBIN TOTAL: 1 mg/dL (ref 0.3–1.2)
BUN: 16 mg/dL (ref 8–23)
CHLORIDE: 100 mmol/L (ref 98–111)
CO2: 24 mmol/L (ref 22–32)
CREATININE: 0.78 mg/dL (ref 0.44–1.00)
Calcium: 9.3 mg/dL (ref 8.9–10.3)
GFR calc Af Amer: >60 mL/min (ref >60)
Glucose, Bld: 133 mg/dL — ABNORMAL HIGH (ref 70–99)
Potassium: 3.6 mmol/L (ref 3.5–5.1)
Sodium: 134 mmol/L — ABNORMAL LOW (ref 135–145)
TOTAL PROTEIN: 8.1 g/dL (ref 6.5–8.1)

## 2018-12-24 LAB — CBC WITH DIFFERENTIAL/PLATELET
ABS IMMATURE GRANULOCYTES: 0.02 10*3/uL (ref 0.00–0.07)
Basophils Absolute: 0 10*3/uL (ref 0.0–0.1)
Basophils Relative: 0 %
Eosinophils Absolute: 0.2 10*3/uL (ref 0.0–0.5)
Eosinophils Relative: 2 %
HEMATOCRIT: 39.5 % (ref 36.0–46.0)
Hemoglobin: 12.6 g/dL (ref 12.0–15.0)
IMMATURE GRANULOCYTES: 0 %
LYMPHS ABS: 1.9 10*3/uL (ref 0.7–4.0)
Lymphocytes Relative: 22 %
MCH: 30.8 pg (ref 26.0–34.0)
MCHC: 31.9 g/dL (ref 30.0–36.0)
MCV: 96.6 fL (ref 80.0–100.0)
MONOS PCT: 10 %
Monocytes Absolute: 0.9 10*3/uL (ref 0.1–1.0)
NEUTROS ABS: 5.5 10*3/uL (ref 1.7–7.7)
Neutrophils Relative %: 66 %
Platelets: 213 10*3/uL (ref 150–400)
RBC: 4.09 MIL/uL (ref 3.87–5.11)
RDW: 12.8 % (ref 11.5–15.5)
WBC: 8.5 10*3/uL (ref 4.0–10.5)
nRBC: 0 % (ref 0.0–0.2)

## 2018-12-24 LAB — PROTIME-INR
INR: 2.1 — AB (ref 0.8–1.2)
INR: 2 — ABNORMAL HIGH (ref 0.8–1.2)
Prothrombin Time: 23 seconds — ABNORMAL HIGH (ref 11.4–15.2)
Prothrombin Time: 22.6 seconds — ABNORMAL HIGH (ref 11.4–15.2)

## 2018-12-24 LAB — HEMOGLOBIN A1C
Hgb A1c MFr Bld: 6.1 % — ABNORMAL HIGH (ref 4.8–5.6)
Mean Plasma Glucose: 128.37 mg/dL

## 2018-12-24 LAB — BRAIN NATRIURETIC PEPTIDE: B Natriuretic Peptide: 951 pg/mL — ABNORMAL HIGH (ref 0.0–100.0)

## 2018-12-24 LAB — HEPARIN LEVEL (UNFRACTIONATED): Heparin Unfractionated: <0.1 IU/mL — ABNORMAL LOW (ref 0.30–0.70)

## 2018-12-24 LAB — LIPASE, BLOOD: Lipase: 24 U/L (ref 11–51)

## 2018-12-24 LAB — TROPONIN I
TROPONIN I: 5.02 ng/mL — AB (ref <0.03)
Troponin I: 4.81 ng/mL (ref <0.03)

## 2018-12-24 MED ORDER — ALUM & MAG HYDROXIDE-SIMETH 200-200-20 MG/5ML PO SUSP
30.0000 mL | Freq: Once | ORAL | Status: AC
  Administered 2018-12-24: 30 mL via ORAL
  Filled 2018-12-24: qty 30

## 2018-12-24 MED ORDER — ACETAMINOPHEN 325 MG PO TABS
650.0000 mg | ORAL_TABLET | ORAL | Status: DC | PRN
  Administered 2018-12-26 – 2018-12-28 (×5): 650 mg via ORAL
  Filled 2018-12-24 (×5): qty 2

## 2018-12-24 MED ORDER — SENNA 8.6 MG PO TABS
1.0000 | ORAL_TABLET | Freq: Every day | ORAL | Status: DC
  Administered 2018-12-25 – 2018-12-28 (×3): 8.6 mg via ORAL
  Filled 2018-12-24 (×7): qty 1

## 2018-12-24 MED ORDER — LIDOCAINE VISCOUS HCL 2 % MT SOLN
15.0000 mL | Freq: Once | OROMUCOSAL | Status: AC
  Administered 2018-12-24: 15 mL via ORAL
  Filled 2018-12-24: qty 15

## 2018-12-24 MED ORDER — ASPIRIN 81 MG PO CHEW
324.0000 mg | CHEWABLE_TABLET | Freq: Once | ORAL | Status: AC
  Administered 2018-12-24: 324 mg via ORAL
  Filled 2018-12-24: qty 4

## 2018-12-24 MED ORDER — CARVEDILOL 6.25 MG PO TABS
18.7500 mg | ORAL_TABLET | Freq: Two times a day (BID) | ORAL | Status: DC
  Administered 2018-12-25 – 2018-12-26 (×4): 18.75 mg via ORAL
  Filled 2018-12-24 (×5): qty 1

## 2018-12-24 MED ORDER — SIMETHICONE 80 MG PO CHEW
80.0000 mg | CHEWABLE_TABLET | Freq: Four times a day (QID) | ORAL | Status: DC | PRN
  Administered 2018-12-24 – 2018-12-28 (×7): 80 mg via ORAL
  Filled 2018-12-24 (×7): qty 1

## 2018-12-24 MED ORDER — ONDANSETRON HCL 4 MG/2ML IJ SOLN
4.0000 mg | Freq: Four times a day (QID) | INTRAMUSCULAR | Status: DC | PRN
  Administered 2018-12-27: 4 mg via INTRAVENOUS
  Filled 2018-12-24: qty 2

## 2018-12-24 MED ORDER — FUROSEMIDE 10 MG/ML IJ SOLN
40.0000 mg | Freq: Once | INTRAMUSCULAR | Status: DC
  Filled 2018-12-24: qty 4

## 2018-12-24 MED ORDER — HEPARIN (PORCINE) 25000 UT/250ML-% IV SOLN
900.0000 [IU]/h | INTRAVENOUS | Status: DC
  Administered 2018-12-24: 950 [IU]/h via INTRAVENOUS
  Administered 2018-12-25 – 2018-12-26 (×2): 1250 [IU]/h via INTRAVENOUS
  Administered 2018-12-27 – 2018-12-28 (×2): 900 [IU]/h via INTRAVENOUS
  Filled 2018-12-24 (×5): qty 250

## 2018-12-24 MED ORDER — IOHEXOL 350 MG/ML SOLN
100.0000 mL | Freq: Once | INTRAVENOUS | Status: AC | PRN
  Administered 2018-12-24: 100 mL via INTRAVENOUS

## 2018-12-24 MED ORDER — ROSUVASTATIN CALCIUM 20 MG PO TABS
40.0000 mg | ORAL_TABLET | Freq: Every day | ORAL | Status: DC
  Administered 2018-12-25 – 2018-12-28 (×4): 40 mg via ORAL
  Filled 2018-12-24 (×3): qty 2
  Filled 2018-12-24: qty 8
  Filled 2018-12-24 (×3): qty 2

## 2018-12-24 MED ORDER — NITROGLYCERIN 0.4 MG SL SUBL: 0.4000 mg | SUBLINGUAL_TABLET | SUBLINGUAL | Status: DC | PRN

## 2018-12-24 MED ORDER — LOSARTAN POTASSIUM 50 MG PO TABS
50.0000 mg | ORAL_TABLET | Freq: Every day | ORAL | Status: DC
  Administered 2018-12-25 – 2018-12-28 (×3): 50 mg via ORAL
  Filled 2018-12-24: qty 2
  Filled 2018-12-24 (×4): qty 1

## 2018-12-24 MED ORDER — ASPIRIN EC 81 MG PO TBEC
81.0000 mg | DELAYED_RELEASE_TABLET | Freq: Every day | ORAL | Status: DC
  Administered 2018-12-26 – 2018-12-28 (×3): 81 mg via ORAL
  Filled 2018-12-24 (×3): qty 1

## 2018-12-24 NOTE — Progress Notes (Signed)
Offered to do echo while patient is sitting in chair. She said no, she doesn't want it. She's tired and sore from other tests.

## 2018-12-24 NOTE — ED Provider Notes (Signed)
[facility] EMERGENCY DEPARTMENT Provider Note   CSN: [id number] Arrival date & time: 12/24/18  0522    History   Chief Complaint Chief Complaint  Patient presents with  . Shortness of Breath    HPI [name] is a 85 y.o. female.     Patient with history of atrial fibrillation on Coumadin, CHF, hypertension, hiatal hernia presenting with increased shortness of breath for the past several hours.  Patient states she thinks she is having some shortness of breath due to "sinus drainage" which is a chronic problem for her as well as increased "gas" and frequent belching.  States she has a history of frequent belching and esophageal reflux and used to be on ranitidine before it was taken off the market.  She states she has been feeling short of breath for the past several hours and believes it is combination of her gas as well as sinus drainage.  Both of these are chronic problems for her.  She states she had a brief episode of central chest pain earlier that lasted for a few seconds and is now resolved.  States her breathing is worse tonight and that is why she decided to come in.  She denies any change in her chronic leg swelling.  She has the sleep propped up at baseline which is unchanged.  She denies any chest pain currently.  States she feels "gassy" in her stomach and is had frequent belching but no nausea or vomiting.  Son states everything started after eating Brussels sprouts several days ago.   Shortness of Breath  Associated symptoms: abdominal pain   Associated symptoms: no chest pain, no diaphoresis, no fever, no headaches, no rash and no vomiting     Past Medical History:  Diagnosis Date  . Atrial fibrillation (HCC)   . CHF (congestive heart failure) (HCC)    EF  30%  Echo 01/17/11,  EF 50% in 2014  . Chronic airway obstruction, not elsewhere classified   . Esophageal reflux   . Hernia   . Hypertension   . Hyposmolality and/or hyponatremia   . Mitral regurgitation     . Moderate tricuspid regurgitation by prior echocardiogram   . Morbid obesity (HCC)   . Osteoarthrosis, unspecified whether generalized or localized, unspecified site     Patient Active Problem List   Diagnosis Date Noted  . RBBB 02/24/2018  . Encounter for therapeutic drug monitoring 12/16/2013  . Tachycardia 01/14/2012  . Moderate tricuspid regurgitation by prior echocardiogram   . CHF (congestive heart failure) (HCC)   . Hypertension   . Morbid obesity (HCC)   . Mitral regurgitation   . Atrial fibrillation (HCC) 01/31/2011  . Long term (current) use of anticoagulants 01/31/2011    Past Surgical History:  Procedure Laterality Date  . ABDOMINAL HYSTERECTOMY    . Right lower extremity surgery       OB History   No obstetric history on file.      Home Medications    Prior to Admission medications   Medication Sig Start Date End Date Taking? Authorizing Provider  acetaminophen (TYLENOL) 500 MG tablet Take 1,000 mg by mouth every 6 (six) hours as needed for pain.     [provider]  carvedilol (COREG) 12.5 MG tablet TAKE 1&1/2 TABLETS BY MOUTH 2 TIMES DAILY WITH A MEAL. 09/28/18   Rollene Rotunda, MD  [facility]S SENNA 8.6 MG tablet TAKE 1 TABLET BY MOUTH EVERY DAY 06/25/18   Rollene Rotunda, MD  fluticasone [facility]) 50  MCG/ACT nasal spray SPRAY 2 SPRAYS INTO EACH NOSTRIL EVERY DAY 10/06/18   Rollene RotundaHochrein, James, MD  furosemide (LASIX) 20 MG tablet TAKE 1 TABLET BY MOUTH EVERY DAY 10/22/18   Rollene RotundaHochrein, James, MD  losartan (COZAAR) 25 MG tablet Take 25 mg by mouth daily as needed.    [provider]  losartan (COZAAR) 50 MG tablet TAKE 1 TABLET (50 MG TOTAL) BY MOUTH DAILY. 07/05/18   Rollene RotundaHochrein, James, MD  Multiple Vitamins-Minerals (PRESERVISION AREDS 2+MULTI VIT PO) Take 1 capsule by mouth 2 (two) times daily.    [provider]  polyethylene glycol powder (GLYCOLAX/MIRALAX) powder Take 17 g by mouth daily as needed (for laxative/constipation). 11/19/15   Rollene RotundaHochrein,  James, MD  Saline (SIMPLY SALINE) 0.9 % AERS Place 1 spray into the nose daily as needed (for congestion).    [provider]  triamcinolone cream (KENALOG) 0.1 % Apply topically at bedtime. 09/01/18   Rollene RotundaHochrein, James, MD  UNABLE TO FIND HOVEROUND/SCOOTER USE AS DIRECTED #1 DX:CHF,ATRIAL Gabriel EaringFIBRILLATION,MITRAL REGURGITATION 01/03/14   Rollene RotundaHochrein, James, MD  warfarin (COUMADIN) 5 MG tablet TAKE 1/2 TO 1 TABLET DAILY AS DIRECTED BY COUMADIN CLINIC 10/29/18   Rollene RotundaHochrein, James, MD    Family History Family History  Problem Relation Age of Onset  . Heart attack Mother   . Alzheimer's disease Father     Social History Social History   Tobacco Use  . Smoking status: Never Smoker  . Smokeless tobacco: Never Used  Substance Use Topics  . Alcohol use: No  . Drug use: No     Allergies   Garlic and Penicillins   Review of Systems Review of Systems  Constitutional: Positive for appetite change and fatigue. Negative for diaphoresis and fever.  HENT: Negative for congestion and rhinorrhea.   Eyes: Negative for visual disturbance.  Respiratory: Positive for chest tightness and shortness of breath.   Cardiovascular: Positive for leg swelling. Negative for chest pain.  Gastrointestinal: Positive for abdominal distention, abdominal pain and nausea. Negative for vomiting.  Genitourinary: Negative for dysuria and hematuria.  Musculoskeletal: Positive for arthralgias, back pain and myalgias.  Skin: Negative for rash.  Neurological: Negative for dizziness, weakness and headaches.   all other systems are negative except as noted in the HPI and PMH.     Physical Exam Updated Vital Signs BP (!) 141/92 (BP Location: Right Arm)   Pulse 76   Temp 98 F (36.7 C) (Oral)   Resp (!) 22   SpO2 96%   Physical Exam Vitals signs and nursing note reviewed.  Constitutional:      General: She is not in acute distress.    Appearance: She is well-developed.     Comments: No distress, speaking  full sentences, frequent belching  HENT:     Head: Normocephalic and atraumatic.     Mouth/Throat:     Pharynx: No oropharyngeal exudate.  Eyes:     Conjunctiva/sclera: Conjunctivae normal.     Pupils: Pupils are equal, round, and reactive to light.  Neck:     Musculoskeletal: Normal range of motion and neck supple.     Comments: No meningismus. Cardiovascular:     Rate and Rhythm: Normal rate. Rhythm irregular.     Heart sounds: Normal heart sounds. No murmur.  Pulmonary:     Effort: Pulmonary effort is normal. No respiratory distress.     Breath sounds: Normal breath sounds.  Chest:     Chest wall: No tenderness.  Abdominal:     General: There  is distension.     Palpations: Abdomen is soft.     Tenderness: There is no abdominal tenderness. There is no guarding or rebound.     Comments: Distended abdomen, no guarding or rebound, mild diffuse tenderness  Musculoskeletal: Normal range of motion.     Right lower leg: She exhibits tenderness. Edema present.     Left lower leg: Edema present.     Comments: Bilateral lower extremity edema, left greater than right.  Intact PT pulses with Doppler  Skin:    General: Skin is warm.     Capillary Refill: Capillary refill takes less than 2 seconds.  Neurological:     General: No focal deficit present.     Mental Status: She is alert and oriented to person, place, and time. Mental status is at baseline.     Cranial Nerves: No cranial nerve deficit.     Motor: No abnormal muscle tone.     Coordination: Coordination normal.     Comments: No ataxia on finger to nose bilaterally. No pronator drift. 5/5 strength throughout. CN 2-12 intact.Equal grip strength. Sensation intact.   Psychiatric:        Behavior: Behavior normal.      ED Treatments / Results  Labs (all labs ordered are listed, but only abnormal results are displayed) Labs Reviewed  COMPREHENSIVE METABOLIC PANEL - Abnormal; Notable for the following components:      Result  Value   Sodium 134 (*)    Glucose, Bld 133 (*)    All other components within normal limits  TROPONIN I - Abnormal; Notable for the following components:   Troponin I 5.02 (*)    All other components within normal limits  BRAIN NATRIURETIC PEPTIDE - Abnormal; Notable for the following components:   B Natriuretic Peptide 951.0 (*)    All other components within normal limits  PROTIME-INR - Abnormal; Notable for the following components:   Prothrombin Time 23.0 (*)    INR 2.1 (*)    All other components within normal limits  CBC WITH DIFFERENTIAL/PLATELET  LIPASE, BLOOD  URINALYSIS, ROUTINE W REFLEX MICROSCOPIC    EKG None  Radiology Dg Chest Portable 1 View  Result Date: 12/24/2018 CLINICAL DATA:  Shortness of breath EXAM: PORTABLE CHEST 1 VIEW COMPARISON:  06/17/2018 FINDINGS: Chronic cardiomegaly. Cephalized blood flow and interstitial coarsening above prior baseline. Possible trace effusions. No asymmetric opacity or air bronchograms. Chronic large lung volumes. IMPRESSION: Mild CHF. Electronically Signed   By: Marnee Spring M.D.   On: 12/24/2018 07:13   Dg Abd Portable 2 Views  Result Date: 12/24/2018 CLINICAL DATA:  Shortness of breath EXAM: PORTABLE ABDOMEN - 2 VIEW COMPARISON:  None. FINDINGS: Nonobstructive bowel gas pattern. No abnormal stool retention. No concerning calcification or mass effect. Hip osteoarthritis with asymmetric advanced right narrowing. Lumbar levoscoliosis and degeneration. IMPRESSION: Nonobstructive bowel gas pattern. Electronically Signed   By: Marnee Spring M.D.   On: 12/24/2018 07:14   Ct Angio Chest/abd/pel For Dissection W And/or Wo Contrast  Result Date: 12/24/2018 CLINICAL DATA:  Chest pain with acute aortic syndrome suspected EXAM: CT ANGIOGRAPHY CHEST, ABDOMEN AND PELVIS TECHNIQUE: Multidetector CT imaging through the chest, abdomen and pelvis was performed using the standard protocol during bolus administration of intravenous contrast.  Multiplanar reconstructed images and MIPs were obtained and reviewed to evaluate the vascular anatomy. CONTRAST:  OMNIPAQUE IOHEXOL 350 MG/ML SOLN COMPARISON:  01/16/2011 chest CT.  01/22/2011 abdominal CT FINDINGS: CTA CHEST FINDINGS Cardiovascular: Noncontrast chest CT  shows no acute intramural hematoma. There is extensive atherosclerotic calcification of the aorta and coronaries. Cardiomegaly. No pericardial effusion. Non opacified left atrial appendage. Postcontrast imaging shows no acute aortic dissection. There is an irregular atherosclerotic plaque along the posterior wall of the descending aorta without superimposed inflammation. Mediastinum/Nodes: Negative for adenopathy or hematoma. Lungs/Pleura: Interlobular septal thickening at the right base, likely mild edema. Trace right pleural effusion. 6 mm left upper lobe pulmonary nodule that is stable from 2012. Musculoskeletal: Degenerative changes without acute or aggressive finding Review of the MIP images confirms the above findings. CTA ABDOMEN AND PELVIS FINDINGS VASCULAR Aorta: Diffuse atherosclerotic plaque of the aorta with diffuse fusiform dilatation. Just below the renal arteries is maximal aneurysmal enlargement to 3.8 cm. This segment measured 3 cm previously. No dissection or inflammatory wall thickening. No evidence of rupture or impending rupture. Celiac: Atherosclerotic plaque with ostial stenosis that is at least moderate. No acute finding SMA: Atherosclerotic plaque at the origin with moderate to advanced stenosis. No branch occlusion Renals: Advanced atheromatous narrowing of the proximal right renal artery but symmetric renal enhancement. Poor flow in left segmental branch vessel with cortical scarring. IMA: Patent Inflow: Atherosclerotic plaque without aneurysm.Extensive atherosclerotic irregularity and moderate to advanced right more than left narrowing of the superficial femoral arteries. Veins: No emergent finding in the arterial  phase Review of the MIP images confirms the above findings. NON-VASCULAR Hepatobiliary: No focal liver abnormality.No evidence of biliary obstruction or stone. Pancreas: Generalized atrophy. Spleen: Unremarkable. Adrenals/Urinary Tract: Negative adrenals. No hydronephrosis. Advanced left renal cortical scarring, segmental and likely infarct. Unremarkable bladder. Stomach/Bowel:  No obstruction. No evidence of inflammation Lymphatic: No mass or adenopathy. Reproductive:Hysterectomy Other: No ascites or pneumoperitoneum. Musculoskeletal: Advanced lumbar spine degeneration with scoliosis. Severe hip osteoarthritis with protrusio deformity on the right Review of the MIP images confirms the above findings. IMPRESSION: 1. No evidence of acute aortic syndrome. 2. Extensive atherosclerosis. There is moderate to advanced narrowing at the origins of the celiac and SMA. 3. 3.8 cm abdominal aortic aneurysm. If appropriate for comorbidities, recommend followup by ultrasound in 2 years. This recommendation follows ACR consensus guidelines: White Paper of the ACR Incidental Findings Committee II on Vascular Findings. J Am Coll Radiol 2013; 10:789-794. 4. Non opacified left atrial appendage correlating with history of atrial fibrillation. This could be delayed mixing or thrombus. 5. Cardiomegaly with borderline pulmonary edema and trace right pleural effusion. Electronically Signed   By: Marnee Spring M.D.   On: 12/24/2018 09:02    Procedures Procedures (including critical care time)  Medications Ordered in ED Medications  alum & mag hydroxide-simeth (MAALOX/MYLANTA) 200-200-20 MG/5ML suspension 30 mL (has no administration in time range)    And  lidocaine (XYLOCAINE) 2 % viscous mouth solution 15 mL (has no administration in time range)  aspirin chewable tablet 324 mg (has no administration in time range)     Initial Impression / Assessment and Plan / ED Course  I have reviewed the triage vital signs and the  nursing notes.  Pertinent labs & imaging results that were available during my care of the patient were reviewed by me and considered in my medical decision making (see chart for details).       Patient with several hour history of shortness of breath with increased "gas" and sinus drainage".  Her EKG shows atrial fibrillation with right bundle branch block and is unchanged.  Denies any chest pain currently but had a brief episode earlier at home.  Troponin is surprisingly  5.0. EKG without acute change. Cardiomegaly on CXR. Distended abdomen on exam, but soft.   ASA given, will start heparin gtt. Hold coumadin/ d/w Pharmacy.   D/w cardiology Dr. Elease Hashimoto at [facility] who accepts patient for admission. She will likely need catheterization but may not be today since INR elevated.   CTA obtained to ensure no aortic pathology given her poor history and symptoms of abdominal "bloating" and gas. No aortic dissection seen.  Admission to [facility] d/w Dr Elease Hashimoto and Patrick Jupiter.  Patient and son updated.  ED ECG REPORT   Date: 12/24/2018  Rate:82  Rhythm: atrial fibrillation  QRS Axis: normal`  Intervals: normal  ST/T Wave abnormalities: inferior and lateral T wave inversions  Conduction Disutrbances:RBBB  Narrative Interpretation:   Old EKG Reviewed:yes  I have personally reviewed the EKG tracing and agree with the computerized printout as noted.  CRITICAL CARE Performed by: Glynn Octave Total critical care time: Critical care time was exclusive of separately billable procedures and treating other patients. Critical care was necessary to treat or prevent imminent or life-threatening deterioration. Critical care was time spent personally by me on the following activities: development of treatment plan with patient and/or surrogate as well as nursing, discussions with consultants, evaluation of patient's response to treatment, examination of patient, obtaining  history from patient or surrogate, ordering and performing treatments and interventions, ordering and review of laboratory studies, ordering and review of radiographic studies, pulse oximetry and re-evaluation of patient's condition.  Final Clinical Impressions(s) / ED Diagnoses   Final diagnoses:  None    ED Discharge Orders    None       Janeice Stegall, Jeannett Senior, MD 12/24/18 [phone]

## 2018-12-24 NOTE — Progress Notes (Signed)
ANTICOAGULATION CONSULT NOTE - Initial Consult  Pharmacy Consult for heparin Indication: NSTEMI   Patient Measurements: Weight: 169 lb 15.6 oz (77.1 kg)    Vital Signs: Temp: 98 F (36.7 C) (03/06 0625) Temp Source: Oral (03/06 0625) BP: 150/112 (03/06 1000) Pulse Rate: 74 (03/06 1000)  Labs: Recent Labs    12/24/18 0543  HGB 12.6  HCT 39.5  PLT 213  LABPROT 23.0*  INR 2.1*  CREATININE 0.78  TROPONINI 5.02*    Estimated Creatinine Clearance: 56 mL/min (by C-G formula based on SCr of 0.78 mg/dL).   Medical History: Past Medical History:  Diagnosis Date  . Atrial fibrillation (HCC)    a. persistent, on Coumadin for anticoagulation.   . CHF (congestive heart failure) (HCC)    a. EF reduced to 30% by echo in 12/2010 b. EF 50% in 2014 c. improved to 55-60% by repeat imaging in 12/2015  . Chronic airway obstruction, not elsewhere classified   . Esophageal reflux   . Hernia   . Hypertension   . Hyposmolality and/or hyponatremia   . Mitral regurgitation   . Moderate tricuspid regurgitation by prior echocardiogram   . Morbid obesity (HCC)   . Osteoarthrosis, unspecified whether generalized or localized, unspecified site     Medications:  (Not in a hospital admission)   Assessment: Pharmacy consulted to dose heparin in patient with NSTEMI.  Patient is on warfarin prior to admission with current INR of 2.1. Admitting MD would like to start heparin despite therapeutic INR.  INR is likely to lower below 2.0 and will monitor with next heparin level.  Goal of Therapy:  Heparin level 0.3-0.7 units/ml Monitor platelets by anticoagulation protocol: Yes   Plan:  No heparin bolus Start heparin infusion at 950 units/hr Check anti-Xa level in 8 hours and daily while on heparin Continue to monitor H&H and platelets  [name] [name] 12/24/2018,10:57 AM

## 2018-12-24 NOTE — ED Triage Notes (Signed)
Pt C/O "gas", sinus congestion, and SOB.

## 2018-12-24 NOTE — ED Notes (Signed)
[facility] here to take patient. 

## 2018-12-24 NOTE — ED Notes (Signed)
[place] PA from [facility] in to see pt

## 2018-12-24 NOTE — ED Notes (Signed)
Pt is upset and doesn't want to be here. Only wants to go home. Explained to her the risk of going home with her heart being strained.

## 2018-12-24 NOTE — ED Notes (Signed)
Have put a meal tray at bedside.

## 2018-12-24 NOTE — ED Notes (Signed)
Pt is refusing medication.

## 2018-12-24 NOTE — Progress Notes (Signed)
ANTICOAGULATION CONSULT NOTE -  Pharmacy Consult for heparin Indication: NSTEMI   Patient Measurements: Weight: 169 lb 15.6 oz (77.1 kg)    Vital Signs: Temp: 98 F (36.7 C) (03/06 0625) Temp Source: Oral (03/06 0625) BP: 142/115 (03/06 1400) Pulse Rate: 63 (03/06 1400)  Labs: Recent Labs    12/24/18 0543 12/24/18 1202 12/24/18 1408  HGB 12.6  --   --   HCT 39.5  --   --   PLT 213  --   --   LABPROT 23.0*  --  22.6*  INR 2.1*  --  2.0*  HEPARINUNFRC  --   --  <0.10*  CREATININE 0.78  --   --   TROPONINI 5.02* 4.81*  --     Estimated Creatinine Clearance: 56 mL/min (by C-G formula based on SCr of 0.78 mg/dL).   Medical History: Past Medical History:  Diagnosis Date  . Atrial fibrillation (HCC)    a. persistent, on Coumadin for anticoagulation.   . CHF (congestive heart failure) (HCC)    a. EF reduced to 30% by echo in 12/2010 b. EF 50% in 2014 c. improved to 55-60% by repeat imaging in 12/2015  . Chronic airway obstruction, not elsewhere classified   . Esophageal reflux   . Hernia   . Hypertension   . Hyposmolality and/or hyponatremia   . Mitral regurgitation   . Moderate tricuspid regurgitation by prior echocardiogram   . Morbid obesity (HCC)   . Osteoarthrosis, unspecified whether generalized or localized, unspecified site     Medications:  (Not in a hospital admission)   Assessment: Pharmacy consulted to dose heparin in patient with NSTEMI.  Patient is on warfarin prior to admission with current INR of 2.0.  Heparin level <0.10  Goal of Therapy:  Heparin level 0.3-0.7 units/ml Monitor platelets by anticoagulation protocol: Yes   Plan:  No heparin bolus Increase heparin infusion to 1250 units/hr. Check anti-Xa level in 8 hours and daily while on heparin. Continue to monitor H&H and platelets.   Salvatore Decent Leontine Radman 12/24/2018,3:01 PM

## 2018-12-24 NOTE — ED Notes (Signed)
Given [facility] report

## 2018-12-24 NOTE — ED Notes (Signed)
Dr Branch at bedside. ?

## 2018-12-24 NOTE — ED Notes (Signed)
CRITICAL VALUE ALERT  Critical Value:  Troponin 4.81  Date & Time Notied: 12/24/2018 1315 // Provider Notified: Dr. Wyline Mood   Orders Received/Actions taken: None yet

## 2018-12-24 NOTE — ED Notes (Signed)
CRITICAL VALUE ALERT  Critical Value:  Trop 5.02  Date & Time Notied:  12/24/2018 0621  Provider Notified: Dr. Manus Gunning  Orders Received/Actions taken: ASA given

## 2018-12-24 NOTE — ED Notes (Signed)
Pt sitting up in room in NAD

## 2018-12-24 NOTE — H&P (Addendum)
History & Physical    Patient ID: Harjot Zavadil MRN: [id number], DOB/AGE: 1932/07/10   Admit date: 12/24/2018  Primary Care Provider: Patient, No Pcp Per Primary Cardiologist: Rollene Rotunda, MD   Chief Complaint: NSTEMI  Patient Profile    Akeela Busk is a 85 y.o. female with past medical history of persistent atrial fibrillation (on Coumadin), chronic diastolic CHF (EF 40-98% by echo in 12/2015), and HTN who is being evaluated for an NSTEMI at the request of Dr. Manus Gunning.    History of Present Illness    Bryla Burek was last examined by Dr. Antoine Poche in 09/2018 and reported having recently been evaluated in the ED for altered mental status and was found to have a urinary tract infection. She denied any recent chest pain or dyspnea on exertion at the time of her visit. The only change to her medications at that time was Digoxin was discontinued given her slow rates.  She presented to [facility] ED earlier this morning for evaluation of "gas".  In talking with the patient today, she is a very vague historian but says she has been having worsening dyspnea on exertion over the past few weeks. She initially accredited this to sinus congestion. Had not been evaluated for her symptoms as she does not have a PCP. She reports having a history of "gas" for the past several years but over the past 2 days she developed a fullness sensation along her chest. She denies any specific pain but says she feels uncomfortable. Symptoms typically improve with belching but have not over the past few days. She denies any associated constipation or diarrhea. She has baseline orthopnea and says she always sleeps propped up with several pillows. Also reports lower extremity edema reports taking Lasix on a daily basis. Unsure of her baseline weight but was at 170 lbs during her office visit in 09/2018.  Initial labs showed WBC 8.5, Hgb 12.6, platelets 213, Na+ 134, K+ 3.6, and creatinine 0.78.  Lipase 24.  BNP  951. INR 2.1. Initial troponin 5.02. CXR shows chronic cardiomegaly with trace pleural effusions.  Abdominal x-ray shows nonobstructive bowel gas pattern.  EKG is not available for review in epic at this time.    Past Medical History:  Diagnosis Date  . Atrial fibrillation (HCC)    a. persistent, on Coumadin for anticoagulation.   . CHF (congestive heart failure) (HCC)    a. EF reduced to 30% by echo in 12/2010 b. EF 50% in 2014 c. improved to 55-60% by repeat imaging in 12/2015  . Chronic airway obstruction, not elsewhere classified   . Esophageal reflux   . Hernia   . Hypertension   . Hyposmolality and/or hyponatremia   . Mitral regurgitation   . Moderate tricuspid regurgitation by prior echocardiogram   . Morbid obesity (HCC)   . Osteoarthrosis, unspecified whether generalized or localized, unspecified site     Past Surgical History:  Procedure Laterality Date  . ABDOMINAL HYSTERECTOMY    . Right lower extremity surgery       Medications Prior to Admission: Prior to Admission medications   Medication Sig Start Date End Date Taking? Authorizing Provider  acetaminophen (TYLENOL) 500 MG tablet Take 1,000 mg by mouth every 6 (six) hours as needed for pain.     [provider]  carvedilol (COREG) 12.5 MG tablet TAKE 1&1/2 TABLETS BY MOUTH 2 TIMES DAILY WITH A MEAL. 09/28/18   Rollene Rotunda, MD  [facility] SENNA 8.6 MG tablet TAKE 1 TABLET  BY MOUTH EVERY DAY 06/25/18   Rollene Rotunda, MD  fluticasone [facility]) 50 MCG/ACT nasal spray SPRAY 2 SPRAYS INTO EACH NOSTRIL EVERY DAY 10/06/18   Rollene Rotunda, MD  furosemide (LASIX) 20 MG tablet TAKE 1 TABLET BY MOUTH EVERY DAY 10/22/18   Rollene Rotunda, MD  losartan (COZAAR) 25 MG tablet Take 25 mg by mouth daily as needed.    [provider]  losartan (COZAAR) 50 MG tablet TAKE 1 TABLET (50 MG TOTAL) BY MOUTH DAILY. 07/05/18   Rollene Rotunda, MD  Multiple Vitamins-Minerals (PRESERVISION AREDS 2+MULTI VIT PO) Take 1 capsule by  mouth 2 (two) times daily.    [provider]  polyethylene glycol powder (GLYCOLAX/MIRALAX) powder Take 17 g by mouth daily as needed (for laxative/constipation). 11/19/15   Rollene Rotunda, MD  Saline (SIMPLY SALINE) 0.9 % AERS Place 1 spray into the nose daily as needed (for congestion).    [provider]  triamcinolone cream (KENALOG) 0.1 % Apply topically at bedtime. 09/01/18   Rollene Rotunda, MD  UNABLE TO FIND HOVEROUND/SCOOTER USE AS DIRECTED #1 DX:CHF,ATRIAL Gabriel Earing REGURGITATION 01/03/14   Rollene Rotunda, MD  warfarin (COUMADIN) 5 MG tablet TAKE 1/2 TO 1 TABLET DAILY AS DIRECTED BY COUMADIN CLINIC 10/29/18   Rollene Rotunda, MD     Allergies:    Allergies  Allergen Reactions  . Garlic Rash  . Penicillins         Social History:   Social History   Socioeconomic History  . Marital status: Widowed    Spouse name: Not on file  . Number of children: 5  . Years of education: Not on file  . Highest education level: Not on file  Occupational History  . Occupation: RETIRED  Social Needs  . Financial resource strain: Not on file  . Food insecurity:    Worry: Not on file    Inability: Not on file  . Transportation needs:    Medical: Not on file    Non-medical: Not on file  Tobacco Use  . Smoking status: Never Smoker  . Smokeless tobacco: Never Used  Substance and Sexual Activity  . Alcohol use: No  . Drug use: No  . Sexual activity: Not on file  Lifestyle  . Physical activity:    Days per week: Not on file    Minutes per session: Not on file  . Stress: Not on file  Relationships  . Social connections:    Talks on phone: Not on file    Gets together: Not on file    Attends religious service: Not on file    Active member of club or organization: Not on file    Attends meetings of clubs or organizations: Not on file    Relationship status: Not on file  . Intimate partner violence:    Fear of current or ex partner: Not on file     Emotionally abused: Not on file    Physically abused: Not on file    Forced sexual activity: Not on file  Other Topics Concern  . Not on file  Social History Narrative   Lives in [place]      Family History:   The patient's family history includes Alzheimer's disease in her father; Heart attack in her mother.     Review of Systems    General:  No chills, fever, night sweats or weight changes.  Cardiovascular:  No palpitations, paroxysmal nocturnal dyspnea. Positive for "fullness" along chest, edema, and dyspnea on exertion.  Dermatological: No rash, lesions/masses Respiratory: No cough, dyspnea Urologic: No hematuria, dysuria Abdominal:   No nausea, vomiting, diarrhea, bright red blood per rectum, melena, or hematemesis Neurologic:  No visual changes, wkns, changes in mental status. All other systems reviewed and are otherwise negative except as noted above.  Physical Exam    Vitals:   12/24/18 0654 12/24/18 0730 12/24/18 0739 12/24/18 0800  BP:  139/90  (!) 153/94  Pulse:    82  Resp:  11  (!) 29  Temp:      TempSrc:      SpO2:   96% 95%  Weight: 77.1 kg      No intake or output data in the 24 hours ending 12/24/18 0829 Filed Weights   12/24/18 0654  Weight: 77.1 kg   Body mass index is 26.62 kg/m.   General: Well developed, well nourished Caucasian female in no acute distress. Head: Normocephalic, atraumatic, sclera non-icteric, no xanthomas, nares are without discharge. Dentition:  Neck: No carotid bruits. JVD not elevated.  Lungs: Respirations regular and unlabored, decreased along bases bilaterally. Heart: Irregularly irregular. No S3 or S4.  No murmur, no rubs, or gallops appreciated. Abdomen: Soft, non-tender, non-distended with normoactive bowel sounds. No hepatomegaly. No rebound/guarding. No obvious abdominal masses. Msk:  Strength and tone appear normal for age. No joint deformities or effusions. Extremities: No clubbing or cyanosis. No edema.   Distal pedal pulses are 2+ bilaterally. Neuro: Alert and oriented X 3. Moves all extremities spontaneously. No focal deficits noted. Psych:  Responds to questions appropriately with a normal affect. Skin: No rashes or lesions noted  Labs and Radiology Studies    EKG:  The ECG that was done is not available for review in Epic at this time.   Relevant CV Studies:  Echocardiogram: 12/20/2015 Study Conclusions  - Left ventricle: The cavity size was normal. There was mild   concentric hypertrophy. Systolic function was normal. The   estimated ejection fraction was in the range of 55% to 60%. The   study was not technically sufficient to allow evaluation of LV   diastolic dysfunction due to atrial fibrillation. - Regional wall motion abnormality: Hypokinesis of the mid inferior   and mid inferolateral myocardium. - Aortic valve: Mildly to moderately calcified annulus. Trileaflet;   mildly thickened leaflets. There was mild regurgitation. - Mitral valve: Mildly calcified annulus. Mildly thickened leaflets   . There was mild regurgitation. - Left atrium: The atrium was moderately dilated. - Right ventricle: Systolic function was mildly reduced. - Right atrium: The atrium was mildly dilated. - Tricuspid valve: There was mild regurgitation.   Laboratory Data:  Chemistry Recent Labs  Lab 12/24/18 0543  NA 134*  K 3.6  CL 100  CO2 24  GLUCOSE 133*  BUN 16  CREATININE 0.78  CALCIUM 9.3  GFRNONAA >60  GFRAA >60  ANIONGAP 10    Recent Labs  Lab 12/24/18 0543  PROT 8.1  ALBUMIN 3.9  AST 37  ALT 16  ALKPHOS 67  BILITOT 1.0   Hematology Recent Labs  Lab 12/24/18 0543  WBC 8.5  RBC 4.09  HGB 12.6  HCT 39.5  MCV 96.6  MCH 30.8  MCHC 31.9  RDW 12.8  PLT 213   Cardiac Enzymes Recent Labs  Lab 12/24/18 0543  TROPONINI 5.02*   No results for input(s): TROPIPOC in the last 168 hours.  BNP Recent Labs  Lab 12/24/18 0543  BNP 951.0*    DDimer No results for  input(s):  DDIMER in the last 168 hours.  Radiology/Studies:  Dg Chest Portable 1 View  Result Date: 12/24/2018 CLINICAL DATA:  Shortness of breath EXAM: PORTABLE CHEST 1 VIEW COMPARISON:  06/17/2018 FINDINGS: Chronic cardiomegaly. Cephalized blood flow and interstitial coarsening above prior baseline. Possible trace effusions. No asymmetric opacity or air bronchograms. Chronic large lung volumes. IMPRESSION: Mild CHF. Electronically Signed   By: Marnee Spring M.D.   On: 12/24/2018 07:13   Dg Abd Portable 2 Views  Result Date: 12/24/2018 CLINICAL DATA:  Shortness of breath EXAM: PORTABLE ABDOMEN - 2 VIEW COMPARISON:  None. FINDINGS: Nonobstructive bowel gas pattern. No abnormal stool retention. No concerning calcification or mass effect. Hip osteoarthritis with asymmetric advanced right narrowing. Lumbar levoscoliosis and degeneration. IMPRESSION: Nonobstructive bowel gas pattern. Electronically Signed   By: Marnee Spring M.D.   On: 12/24/2018 07:14    Assessment and Plan:   1. NSTEMI - Presents with somewhat atypical symptoms as she reports a fullness along her chest which she felt was her typical "gas" but says symptoms have been more prominent over the past 2 days. Also reports progressive dyspnea on exertion for the past 2 weeks. - labs show initial troponin 5.02. EKG is not available for review in epic at this time but by review of EDP notes, this showed her known RBBB with no acute ST changes.  - would continue to cycle cardiac enzymes. Update echocardiogram. Her INR is elevated to 2.1 which limits the ability to perform a cardiac catheterization today. She has already been started on Heparin and would anticipate a catheterization on Monday unless she develops progressive symptoms or new EKG changes prompting this to be performed in the interim. The patient understands that risks include but are not limited to stroke (1 in 1000), death (1 in 1000), kidney failure [usually temporary] (1 in  500), bleeding (1 in 200), allergic reaction [possibly serious] (1 in 200).   - will start ASA  daily. Continue BB and initiate statin therapy. Will check FLP and Hgb A1c for risk stratification as she does not have a PCP.   2. Acute on Chronic Diastolic CHF - She reports having baseline dyspnea on exertion and lower extremity edema. BNP elevated at 951 and CXR shows chronic cardiomegaly with trace pleural effusions. She is on Lasix 20 mg daily PTA. Will write for IV Lasix  today and reassess volume status tomorrow. Would update her echocardiogram to assess LV function (was preserved at 55-60% by imaging in 12/2015). Follow I&O's along with daily weights. Repeat BMET in AM.   3. Abdominal Discomfort - reports a history of "chronic gas". No recent nausea, vomiting, diarrhea, or constipation. LFT's and Lipase WNL. Plain abdominal film showing no acute findings. CT Abdomen being performed at this time with results pending.   4. Chronic Atrial Fibrillation - rates are currently well-controlled in the 60's to 80's. Continue Coreg 18.75mg  BID for rate-control. - INR at 2.1 on admission. Will hold Coumadin and bridge with Heparin in anticipation of cardiac catheterization later this admission. Appreciate Pharmacy's assistance with dosing.   5. HTN - BP has been well controlled at 139/88 -153/94 while in the ED. Continue PTA Losartan  daily and Coreg 18.75 mg BID.    Severity of Illness: The appropriate patient status for this patient is INPATIENT. Inpatient status is judged to be reasonable and necessary in order to provide the required intensity of service to ensure the patient's safety. The patient's presenting symptoms, physical exam findings, and initial radiographic and  laboratory data in the context of their chronic comorbidities is felt to place them at high risk for further clinical deterioration. Furthermore, it is not anticipated that the patient will be medically stable for  discharge from the hospital within 2 midnights of admission. The following factors support the patient status of inpatient.   " The patient's presenting symptoms include chest discomfort and dyspnea on exertion. " The worrisome physical exam findings include decreased breath sounds. " The initial radiographic and laboratory data are worrisome because of elevated cardiac enzymes. " The chronic co-morbidities include atrial fibrillation and chronic diastolic CHF.   * I certify that at the point of admission it is my clinical judgment that the patient will require inpatient hospital care spanning beyond 2 midnights from the point of admission due to high intensity of service, high risk for further deterioration and high frequency of surveillance required.*    For questions or updates, please contact [facility] Please consult [email or web address] for contact info under Cardiology/STEMI.   Signed, Ellsworth Lennox, PA-C 12/24/2018, 8:29 AM Pager: [phone]  Attending note Patient seen and disucssed with PA Iran Ouch, I agree with her documentation. 85 yo female history of PAF, chronic systolic HF LVEF prevoiusly 30% most recently has normalized, HTN, presented with DOE and fullness sensation in chest. Somewhat limited historian.     Lipase 24 WBC 8.5 Hgb 12.6 Plt 213 INR 2.1 BNP 951 K 3.6 Cr 0.78 WBC 8.5  Trop 5--> CXR mild CHF CTA Chest/abd/pelvis: no acute aortic pathology, atherosclerosis, celiac and SMA atherosclerosis, 3.8cm AAA 12/2015 echo LVEF   Patient presents with somewhat nonspecific chest symptoms and SOB, significan troponin elevation consistent with NSTEMI. Will need to wait for INR to come down for cath. Repeat EKG as not showing in system. Some volume overload, agree with IV lasix today and reassess tomorrow.   Dina Rich MD

## 2018-12-24 NOTE — ED Notes (Signed)
Pt sitting up in gerichair. Pt refusing echo x2 due to positioning and refusal to lay down or back

## 2018-12-25 DIAGNOSIS — I509 Heart failure, unspecified: Secondary | ICD-10-CM

## 2018-12-25 LAB — LIPID PANEL
CHOLESTEROL: 201 mg/dL — AB (ref 0–200)
HDL: 51 mg/dL (ref >40)
LDL Cholesterol: 128 mg/dL — ABNORMAL HIGH (ref 0–99)
Total CHOL/HDL Ratio: 3.9 RATIO
Triglycerides: 112 mg/dL (ref <150)
VLDL: 22 mg/dL (ref 0–40)

## 2018-12-25 LAB — CBC
HCT: 36.8 % (ref 36.0–46.0)
Hemoglobin: 11.9 g/dL — ABNORMAL LOW (ref 12.0–15.0)
MCH: 30.7 pg (ref 26.0–34.0)
MCHC: 32.3 g/dL (ref 30.0–36.0)
MCV: 95.1 fL (ref 80.0–100.0)
PLATELETS: 184 10*3/uL (ref 150–400)
RBC: 3.87 MIL/uL (ref 3.87–5.11)
RDW: 12.8 % (ref 11.5–15.5)
WBC: 6.8 10*3/uL (ref 4.0–10.5)
nRBC: 0 % (ref 0.0–0.2)

## 2018-12-25 LAB — BASIC METABOLIC PANEL
ANION GAP: 10 (ref 5–15)
BUN: 12 mg/dL (ref 8–23)
CO2: 21 mmol/L — ABNORMAL LOW (ref 22–32)
Calcium: 8.9 mg/dL (ref 8.9–10.3)
Chloride: 103 mmol/L (ref 98–111)
Creatinine, Ser: 0.81 mg/dL (ref 0.44–1.00)
GFR calc Af Amer: >60 mL/min (ref >60)
GFR calc non Af Amer: >60 mL/min (ref >60)
Glucose, Bld: 127 mg/dL — ABNORMAL HIGH (ref 70–99)
Potassium: 4 mmol/L (ref 3.5–5.1)
Sodium: 134 mmol/L — ABNORMAL LOW (ref 135–145)

## 2018-12-25 LAB — HEPARIN LEVEL (UNFRACTIONATED)
Heparin Unfractionated: <0.1 IU/mL — ABNORMAL LOW (ref 0.30–0.70)
Heparin Unfractionated: 0.28 IU/mL — ABNORMAL LOW (ref 0.30–0.70)

## 2018-12-25 LAB — PROTIME-INR
INR: 2 — ABNORMAL HIGH (ref 0.8–1.2)
Prothrombin Time: 22.5 seconds — ABNORMAL HIGH (ref 11.4–15.2)

## 2018-12-25 MED ORDER — QUETIAPINE FUMARATE 25 MG PO TABS
25.0000 mg | ORAL_TABLET | Freq: Every day | ORAL | Status: DC
  Administered 2018-12-25: 25 mg via ORAL
  Filled 2018-12-25: qty 1

## 2018-12-25 MED ORDER — LORAZEPAM 2 MG/ML IJ SOLN
0.5000 mg | Freq: Once | INTRAMUSCULAR | Status: AC
  Administered 2018-12-25: 0.5 mg via INTRAVENOUS
  Filled 2018-12-25: qty 1

## 2018-12-25 NOTE — Progress Notes (Signed)
ANTICOAGULATION CONSULT NOTE   Pharmacy Consult for heparin Indication: NSTEMI   Patient Measurements: Height: 5\' 6"  (167.6 cm) Weight: 184 lb 4.9 oz (83.6 kg) IBW/kg (Calculated) : 59.3    Vital Signs: Temp: 98.9 F (37.2 C) (03/07 0828) Temp Source: Oral (03/07 0828) BP: 135/87 (03/07 0828) Pulse Rate: 95 (03/07 0828)  Labs: Recent Labs    12/24/18 0543 12/24/18 1202 12/24/18 1408 12/25/18 0411 12/25/18 0928  HGB 12.6  --   --  11.9*  --   HCT 39.5  --   --  36.8  --   PLT 213  --   --  184  --   LABPROT 23.0*  --  22.6* 22.5*  --   INR 2.1*  --  2.0* 2.0*  --   HEPARINUNFRC  --   --  <0.10* <0.10* 0.28*  CREATININE 0.78  --   --  0.81  --   TROPONINI 5.02* 4.81*  --   --   --     Estimated Creatinine Clearance: 56.3 mL/min (by C-G formula based on SCr of 0.81 mg/dL).   Medical History: Past Medical History:  Diagnosis Date  . Atrial fibrillation (HCC)    a. persistent, on Coumadin for anticoagulation.   . CHF (congestive heart failure) (HCC)    a. EF reduced to 30% by echo in 12/2010 b. EF 50% in 2014 c. improved to 55-60% by repeat imaging in 12/2015  . Chronic airway obstruction, not elsewhere classified   . Esophageal reflux   . Hernia   . Hypertension   . Hyposmolality and/or hyponatremia   . Mitral regurgitation   . Moderate tricuspid regurgitation by prior echocardiogram   . Morbid obesity (HCC)   . Osteoarthrosis, unspecified whether generalized or localized, unspecified site     Medications:  Medications Prior to Admission  Medication Sig Dispense Refill Last Dose  . acetaminophen (TYLENOL) 500 MG tablet Take 500 mg by mouth as needed for mild pain. Taking as needed; only about 2x/week.   Past Week at Unknown time  . [facility] SENNA 8.6 MG tablet TAKE 1 TABLET BY MOUTH EVERY DAY 30 tablet 8 12/23/2018 at Unknown time  . Cyanocobalamin (B-12 PO) Take by mouth daily. Uses dropper to administer. Unsure about qty.   12/23/2018 at Unknown time  .  fluticasone (FLONASE) 50 MCG/ACT nasal spray SPRAY 2 SPRAYS INTO EACH NOSTRIL EVERY DAY 16 g 3 12/23/2018 at Unknown time  . furosemide (LASIX) 20 MG tablet TAKE 1 TABLET BY MOUTH EVERY DAY (Patient taking differently: Take 40 mg by mouth daily. ) 90 tablet 0 12/23/2018 at Unknown time  . losartan (COZAAR) 25 MG tablet Take 25 mg by mouth daily as needed (when blood pressure is not controlled by losartan 50mg ).    Past Month at Unknown time  . losartan (COZAAR) 50 MG tablet TAKE 1 TABLET (50 MG TOTAL) BY MOUTH DAILY. 90 tablet 3 12/23/2018 at Unknown time  . Multiple Vitamins-Minerals (PRESERVISION AREDS 2+MULTI VIT PO) Take 1 capsule by mouth 2 (two) times daily.   12/23/2018 at Unknown time  . neomycin-bacitracin-polymyxin (NEOSPORIN) ointment Apply 1 application topically daily.   12/23/2018 at Unknown time  . polyethylene glycol powder (GLYCOLAX/MIRALAX) powder Take 17 g by mouth daily as needed (for laxative/constipation). 527 g 3 Taking  . Saline (SIMPLY SALINE) 0.9 % AERS Place 1 spray into the nose daily as needed (for congestion).   12/23/2018 at Unknown time  . simethicone (MYLICON) 80 MG chewable tablet Chew  80 mg by mouth as needed for flatulence (takes 2-3 times daily as needed).     . triamcinolone cream (KENALOG) 0.1 % Apply topically at bedtime. 454 g 3 12/23/2018 at Unknown time  . warfarin (COUMADIN) 5 MG tablet TAKE 1/2 TO 1 TABLET DAILY AS DIRECTED BY COUMADIN CLINIC (Patient taking differently: Take 2.5-5 mg by mouth daily. Take 1 tablet by mouth on Tuesdays and Saturdays. On other days, take 1/2 tablet.) 90 tablet 1 12/23/2018 at 2300    Assessment: Pharmacy consulted to dose heparin in patient with NSTEMI.  Patient is on warfarin PTA - INR 2.1.   Heparin level is therapeutic at 0.28, on 1250 units/hr. INR today therapeutic at 2, despite no dose on 3/6 (last dose on 3/5). Hgb 11.9, plt 184. Troponin 5.02>4.81. No s/sx of bleeding. No infusion issues this morning - of note, IV out several times  last night per RN which is likely reflective of undetectable levels.   Goal of Therapy:  Heparin level 0.3-0.7 units/ml Monitor platelets by anticoagulation protocol: Yes   Plan:  Increase heparin infusion slightly to 1350 units/hr. Check anti-Xa level daily while on heparin. Continue to monitor H&H and platelets.  Sherron Monday, PharmD, BCCCP Clinical Pharmacist  Pager: [phone] Phone: [phone] 12/25/2018,11:34 AM

## 2018-12-25 NOTE — Plan of Care (Signed)
  Problem: Activity: Goal: Ability to return to baseline activity level will improve Outcome: Progressing   Problem: Cardiovascular: Goal: Ability to achieve and maintain adequate cardiovascular perfusion will improve Outcome: Progressing Goal: Vascular access site(s) Level 0-1 will be maintained Outcome: Progressing   

## 2018-12-25 NOTE — Progress Notes (Signed)
DAILY PROGRESS NOTE   Patient Name: [name] Date of Encounter: 12/25/2018 Cardiologist: Rollene Rotunda, MD  Chief Complaint   Tired  Patient Profile   [name] is a 85 y.o. female with past medical history of persistent atrial fibrillation (on Coumadin), chronic diastolic CHF (EF 76-28% by echo in 12/2015), and HTN who is being evaluated for an NSTEMI at the request of Dr. Manus Gunning.  Subjective   Refused echo yesterday as she was "tired and sore from other tests". Seems somewhat confused again - recent treatment for this thought d/t UTI. BNP elevated at 951 - troponin of 5, now declining to 4.81. On IV heparin - afib noted. LDL 128.  INR 2. CT angio shows "extensive atherosclerosis".  Objective   Vitals:   12/24/18 2100 12/25/18 0005 12/25/18 0615 12/25/18 0828  BP: (!) 149/92 (!) 147/120 (!) 139/102 135/87  Pulse: 71 97 89 95  Resp: 20 19 18 18   Temp: 98.7 F (37.1 C) 98.8 F (37.1 C) 98.8 F (37.1 C) 98.9 F (37.2 C)  TempSrc:  Oral  Oral  SpO2: 98% 97% 92% 93%  Weight:   83.6 kg   Height:        Intake/Output Summary (Last 24 hours) at 12/25/2018 1057 Last data filed at 12/25/2018 0300 Gross per 24 hour  Intake 148.98 ml  Output -  Net 148.98 ml   Filed Weights   12/24/18 0654 12/24/18 1701 12/25/18 0615  Weight: 77.1 kg 80 kg 83.6 kg    Physical Exam   General appearance: fatigued but awake, follows commands Neck: JVD - 3 cm above sternal notch, no carotid bruit and thyroid not enlarged, symmetric, no tenderness/mass/nodules Lungs: diminished breath sounds bilaterally Heart: irregularly irregular rhythm Abdomen: soft, non-tender; bowel sounds normal; no masses,  no organomegaly and obese Extremities: edema trace to 1+ LE edema Pulses: 2+ and symmetric Skin: Skin color, texture, turgor normal. No rashes or lesions Neurologic: Mental status: awake, somewhat confused and somnolent Psych: Seems irritated, refused echo yesterday  Inpatient  Medications    Scheduled Meds: . aspirin EC  81 mg Oral Daily  . carvedilol  18.75 mg Oral BID WC  . furosemide  40 mg Intravenous Once  . losartan  50 mg Oral Daily  . rosuvastatin  40 mg Oral q1800  . senna  1 tablet Oral QHS    Continuous Infusions: . heparin 1,250 Units/hr (12/25/18 1026)    PRN Meds: acetaminophen, nitroGLYCERIN, ondansetron (ZOFRAN) IV, simethicone   Labs   Results for orders placed or performed during the hospital encounter of 12/24/18 (from the past 48 hour(s))  CBC with Differential/Platelet     Status: None   Collection Time: 12/24/18  5:43 AM  Result Value Ref Range   WBC 8.5 4.0 - 10.5 K/uL   RBC 4.09 3.87 - 5.11 MIL/uL   Hemoglobin 12.6 12.0 - 15.0 g/dL   HCT 31.5 17.6 - 16.0 %   MCV 96.6 80.0 - 100.0 fL   MCH 30.8 26.0 - 34.0 pg   MCHC 31.9 30.0 - 36.0 g/dL   RDW 73.7 10.6 - 26.9 %   Platelets 213 150 - 400 K/uL   nRBC 0.0 0.0 - 0.2 %   Neutrophils Relative % 66 %   Neutro Abs 5.5 1.7 - 7.7 K/uL   Lymphocytes Relative 22 %   Lymphs Abs 1.9 0.7 - 4.0 K/uL   Monocytes Relative 10 %   Monocytes Absolute 0.9 0.1 - 1.0 K/uL   Eosinophils  Relative 2 %   Eosinophils Absolute 0.2 0.0 - 0.5 K/uL   Basophils Relative 0 %   Basophils Absolute 0.0 0.0 - 0.1 K/uL   Immature Granulocytes 0 %   Abs Immature Granulocytes 0.02 0.00 - 0.07 K/uL    Comment: Performed at [facility], [address] [place], [place] [address]  Comprehensive metabolic panel     Status: Abnormal   Collection Time: 12/24/18  5:43 AM  Result Value Ref Range   Sodium 134 (L) 135 - 145 mmol/L   Potassium 3.6 3.5 - 5.1 mmol/L   Chloride 100 98 - 111 mmol/L   CO2 24 22 - 32 mmol/L   Glucose, Bld 133 (H) 70 - 99 mg/dL   BUN 16 8 - 23 mg/dL   Creatinine, Ser 9.81 0.44 - 1.00 mg/dL   Calcium 9.3 8.9 - 19.1 mg/dL   Total Protein 8.1 6.5 - 8.1 g/dL   Albumin 3.9 3.5 - 5.0 g/dL   AST 37 15 - 41 U/L   ALT 16 0 - 44 U/L   Alkaline Phosphatase 67 38 - 126 U/L   Total Bilirubin  1.0 0.3 - 1.2 mg/dL   GFR calc non Af Amer >60 >60 mL/min   GFR calc Af Amer >60 >60 mL/min   Anion gap 10 5 - 15    Comment: Performed at [facility], [address], [place], [place] [address]  Troponin I - ONCE - STAT     Status: Abnormal   Collection Time: 12/24/18  5:43 AM  Result Value Ref Range   Troponin I 5.02 (HH) <0.03 ng/mL    Comment: CRITICAL RESULT CALLED TO, READ BACK BY AND VERIFIED WITH: DOSS,M AT 6:20AM ON 12/24/18 BY [facility] Performed at [facility], [address] [place], [place] [address]   Brain natriuretic peptide     Status: Abnormal   Collection Time: 12/24/18  5:43 AM  Result Value Ref Range   B Natriuretic Peptide 951.0 (H) 0.0 - 100.0 pg/mL    Comment: Performed at [facility], [address] [place], [place] [address]  Protime-INR     Status: Abnormal   Collection Time: 12/24/18  5:43 AM  Result Value Ref Range   Prothrombin Time 23.0 (H) 11.4 - 15.2 seconds   INR 2.1 (H) 0.8 - 1.2    Comment: (NOTE) INR goal varies based on device and disease states. Performed at [facility], [address] [place], [place] [address]   Lipase, blood     Status: None   Collection Time: 12/24/18  5:43 AM  Result Value Ref Range   Lipase 24 11 - 51 U/L    Comment: Performed at [facility], [address] [place], [place] [address]  Troponin I - Now Then Q6H     Status: Abnormal   Collection Time: 12/24/18 12:02 PM  Result Value Ref Range   Troponin I 4.81 (HH) <0.03 ng/mL    Comment: CRITICAL RESULT CALLED TO, READ BACK BY AND VERIFIED WITH: WILEY E. @ 1242 ON [id number] BY HENDERSON L. Performed at [facility], [address] [place], [place] [address]   Hemoglobin A1c     Status: Abnormal   Collection Time: 12/24/18 12:02 PM  Result Value Ref Range   Hgb A1c MFr Bld 6.1 (H) 4.8 - 5.6 %    Comment: (NOTE) Pre diabetes:          5.7%-6.4% Diabetes:              >  6.4% Glycemic control for   <7.0% adults with diabetes    Mean Plasma Glucose 128.37  mg/dL    Comment: Performed at [facility], [address] [place], [place] [address]  Heparin level (unfractionated)     Status: Abnormal   Collection Time: 12/24/18  2:08 PM  Result Value Ref Range   Heparin Unfractionated <0.10 (L) 0.30 - 0.70 IU/mL    Comment: (NOTE) If heparin results are below expected values, and patient dosage has  been confirmed, suggest follow up testing of antithrombin III levels. Performed at [facility], [address] [place], [place] [address]   Protime-INR     Status: Abnormal   Collection Time: 12/24/18  2:08 PM  Result Value Ref Range   Prothrombin Time 22.6 (H) 11.4 - 15.2 seconds   INR 2.0 (H) 0.8 - 1.2    Comment: (NOTE) INR goal varies based on device and disease states. Performed at [facility], [address] [place], [place] [address]   Basic metabolic panel     Status: Abnormal   Collection Time: 12/25/18  4:11 AM  Result Value Ref Range   Sodium 134 (L) 135 - 145 mmol/L   Potassium 4.0 3.5 - 5.1 mmol/L   Chloride 103 98 - 111 mmol/L   CO2 21 (L) 22 - 32 mmol/L   Glucose, Bld 127 (H) 70 - 99 mg/dL   BUN 12 8 - 23 mg/dL   Creatinine, Ser 9.14 0.44 - 1.00 mg/dL   Calcium 8.9 8.9 - 78.2 mg/dL   GFR calc non Af Amer >60 >60 mL/min   GFR calc Af Amer >60 >60 mL/min   Anion gap 10 5 - 15    Comment: Performed at [facility], [address], [place], [place] [address]  Lipid panel     Status: Abnormal   Collection Time: 12/25/18  4:11 AM  Result Value Ref Range   Cholesterol 201 (H) 0 - 200 mg/dL   Triglycerides 308 <657 mg/dL   HDL 51 >84 mg/dL   Total CHOL/HDL Ratio 3.9 RATIO   VLDL 22 0 - 40 mg/dL   LDL Cholesterol 696 (H) 0 - 99 mg/dL    Comment:        Total Cholesterol/HDL:CHD Risk Coronary Heart Disease Risk Table                     Men   Women  1/2 Average Risk   3.4   3.3  Average Risk       5.0   4.4  2 X Average Risk   9.6   7.1  3 X Average Risk  23.4   11.0        Use the calculated Patient  Ratio above and the CHD Risk Table to determine the patient's CHD Risk.        ATP III CLASSIFICATION (LDL):  <100     mg/dL   Optimal  295-284  mg/dL   Near or Above                    Optimal  130-159  mg/dL   Borderline  132-440  mg/dL   High  >102     mg/dL   Very High Performed at [facility], [address], [place], [place] [address]   CBC     Status: Abnormal   Collection Time: 12/25/18  4:11 AM  Result Value Ref Range  WBC 6.8 4.0 - 10.5 K/uL   RBC 3.87 3.87 - 5.11 MIL/uL   Hemoglobin 11.9 (L) 12.0 - 15.0 g/dL   HCT 16.1 09.6 - 04.5 %   MCV 95.1 80.0 - 100.0 fL   MCH 30.7 26.0 - 34.0 pg   MCHC 32.3 30.0 - 36.0 g/dL   RDW 40.9 81.1 - 91.4 %   Platelets 184 150 - 400 K/uL   nRBC 0.0 0.0 - 0.2 %    Comment: Performed at [facility], [address], [place], [place] [address]  Heparin level (unfractionated)     Status: Abnormal   Collection Time: 12/25/18  4:11 AM  Result Value Ref Range   Heparin Unfractionated <0.10 (L) 0.30 - 0.70 IU/mL    Comment: REPEATED TO VERIFY (NOTE) If heparin results are below expected values, and patient dosage has  been confirmed, suggest follow up testing of antithrombin III levels. Performed at [facility], [address], [place], [place] [address]   Protime-INR     Status: Abnormal   Collection Time: 12/25/18  4:11 AM  Result Value Ref Range   Prothrombin Time 22.5 (H) 11.4 - 15.2 seconds   INR 2.0 (H) 0.8 - 1.2    Comment: (NOTE) INR goal varies based on device and disease states. Performed at [facility], [address], [place], [place] [address]   Heparin level (unfractionated)     Status: Abnormal   Collection Time: 12/25/18  9:28 AM  Result Value Ref Range   Heparin Unfractionated 0.28 (L) 0.30 - 0.70 IU/mL    Comment: (NOTE) If heparin results are below expected values, and patient dosage has  been confirmed, suggest follow up testing of antithrombin III levels. Performed at [facility], [address], [place], [place] [address]     ECG   N/A  Telemetry   Afib with CVR - Personally Reviewed  Radiology    Dg Chest Portable 1 View  Result Date: 12/24/2018 CLINICAL DATA:  Shortness of breath EXAM: PORTABLE CHEST 1 VIEW COMPARISON:  06/17/2018 FINDINGS: Chronic cardiomegaly. Cephalized blood flow and interstitial coarsening above prior baseline. Possible trace effusions. No asymmetric opacity or air bronchograms. Chronic large lung volumes. IMPRESSION: Mild CHF. Electronically Signed   By: Marnee Spring M.D.   On: 12/24/2018 07:13   Dg Abd Portable 2 Views  Result Date: 12/24/2018 CLINICAL DATA:  Shortness of breath EXAM: PORTABLE ABDOMEN - 2 VIEW COMPARISON:  None. FINDINGS: Nonobstructive bowel gas pattern. No abnormal stool retention. No concerning calcification or mass effect. Hip osteoarthritis with asymmetric advanced right narrowing. Lumbar levoscoliosis and degeneration. IMPRESSION: Nonobstructive bowel gas pattern. Electronically Signed   By: Marnee Spring M.D.   On: 12/24/2018 07:14   Ct Angio Chest/abd/pel For Dissection W And/or Wo Contrast  Result Date: 12/24/2018 CLINICAL DATA:  Chest pain with acute aortic syndrome suspected EXAM: CT ANGIOGRAPHY CHEST, ABDOMEN AND PELVIS TECHNIQUE: Multidetector CT imaging through the chest, abdomen and pelvis was performed using the standard protocol during bolus administration of intravenous contrast. Multiplanar reconstructed images and MIPs were obtained and reviewed to evaluate the vascular anatomy. CONTRAST:  OMNIPAQUE IOHEXOL 350 MG/ML SOLN COMPARISON:  01/16/2011 chest CT.  01/22/2011 abdominal CT FINDINGS: CTA CHEST FINDINGS Cardiovascular: Noncontrast chest CT shows no acute intramural hematoma. There is extensive atherosclerotic calcification of the aorta and coronaries. Cardiomegaly. No pericardial effusion. Non opacified left atrial appendage. Postcontrast imaging shows no acute aortic dissection.  There is  an irregular atherosclerotic plaque along the posterior wall of the descending aorta without superimposed inflammation. Mediastinum/Nodes: Negative for adenopathy or hematoma. Lungs/Pleura: Interlobular septal thickening at the right base, likely mild edema. Trace right pleural effusion. 6 mm left upper lobe pulmonary nodule that is stable from 2012. Musculoskeletal: Degenerative changes without acute or aggressive finding Review of the MIP images confirms the above findings. CTA ABDOMEN AND PELVIS FINDINGS VASCULAR Aorta: Diffuse atherosclerotic plaque of the aorta with diffuse fusiform dilatation. Just below the renal arteries is maximal aneurysmal enlargement to 3.8 cm. This segment measured 3 cm previously. No dissection or inflammatory wall thickening. No evidence of rupture or impending rupture. Celiac: Atherosclerotic plaque with ostial stenosis that is at least moderate. No acute finding SMA: Atherosclerotic plaque at the origin with moderate to advanced stenosis. No branch occlusion Renals: Advanced atheromatous narrowing of the proximal right renal artery but symmetric renal enhancement. Poor flow in left segmental branch vessel with cortical scarring. IMA: Patent Inflow: Atherosclerotic plaque without aneurysm.Extensive atherosclerotic irregularity and moderate to advanced right more than left narrowing of the superficial femoral arteries. Veins: No emergent finding in the arterial phase Review of the MIP images confirms the above findings. NON-VASCULAR Hepatobiliary: No focal liver abnormality.No evidence of biliary obstruction or stone. Pancreas: Generalized atrophy. Spleen: Unremarkable. Adrenals/Urinary Tract: Negative adrenals. No hydronephrosis. Advanced left renal cortical scarring, segmental and likely infarct. Unremarkable bladder. Stomach/Bowel:  No obstruction. No evidence of inflammation Lymphatic: No mass or adenopathy. Reproductive:Hysterectomy Other: No ascites or pneumoperitoneum.  Musculoskeletal: Advanced lumbar spine degeneration with scoliosis. Severe hip osteoarthritis with protrusio deformity on the right Review of the MIP images confirms the above findings. IMPRESSION: 1. No evidence of acute aortic syndrome. 2. Extensive atherosclerosis. There is moderate to advanced narrowing at the origins of the celiac and SMA. 3. 3.8 cm abdominal aortic aneurysm. If appropriate for comorbidities, recommend followup by ultrasound in 2 years. This recommendation follows ACR consensus guidelines: White Paper of the ACR Incidental Findings Committee II on Vascular Findings. J Am Coll Radiol 2013; 10:789-794. 4. Non opacified left atrial appendage correlating with history of atrial fibrillation. This could be delayed mixing or thrombus. 5. Cardiomegaly with borderline pulmonary edema and trace right pleural effusion. Electronically Signed   By: Marnee Spring M.D.   On: 12/24/2018 09:02    Cardiac Studies   N/A  Assessment   1. Active Problems: 2.   NSTEMI (non-ST elevated myocardial infarction) (HCC) 3. Acute congestive heart failure  Plan   1. 85 yo female with lethargy, AMS - ?dementia, now with probable CHF and elevated troponin to 5 (on presentation), which is declining, c/w recent NSTEMI. She refused echo yesterday - but not sure she has capacity to make an informed decision. Apparently her son is coming today - will d/w him, but would like to get echo as I suspect she has new WMA's and likely systolic CHF - will need to work to figure out etiology of altered mental status - plan was for possible cath early this week if she is more awake and if her INR comes down to a safe level.  Time Spent Directly with Patient:  I have spent a total of 35 minutes with the patient reviewing hospital notes, telemetry, EKGs, labs and examining the patient as well as establishing an assessment and plan that was discussed personally with the patient.  > 50% of time was spent in direct patient  care.  Length of Stay:  LOS: 1 day   Lisette Abu.  Rennis Golden, MD, [facility], FACP  [place]  [facility]  Medical Director of the [facility] Lipid [facility]  Cardiovascular Risk Reduction Clinic Diplomate of the American Board of Clinical Lipidology Attending Cardiologist  Direct Dial: [phone]  Fax: [phone]  Website:  [email or web address]  12/25/2018, 10:57 AM

## 2018-12-26 ENCOUNTER — Inpatient Hospital Stay (HOSPITAL_COMMUNITY): Payer: Medicare Other

## 2018-12-26 DIAGNOSIS — Z515 Encounter for palliative care: Secondary | ICD-10-CM

## 2018-12-26 DIAGNOSIS — I361 Nonrheumatic tricuspid (valve) insufficiency: Secondary | ICD-10-CM

## 2018-12-26 LAB — CBC
HEMATOCRIT: 33.5 % — AB (ref 36.0–46.0)
HEMOGLOBIN: 11.1 g/dL — AB (ref 12.0–15.0)
MCH: 31.4 pg (ref 26.0–34.0)
MCHC: 33.1 g/dL (ref 30.0–36.0)
MCV: 94.9 fL (ref 80.0–100.0)
Platelets: 177 10*3/uL (ref 150–400)
RBC: 3.53 MIL/uL — AB (ref 3.87–5.11)
RDW: 13.2 % (ref 11.5–15.5)
WBC: 6.1 10*3/uL (ref 4.0–10.5)
nRBC: 0 % (ref 0.0–0.2)

## 2018-12-26 LAB — ECHOCARDIOGRAM COMPLETE
Height: 66 in
Weight: 2934.4 oz

## 2018-12-26 LAB — HEPARIN LEVEL (UNFRACTIONATED)
Heparin Unfractionated: 0.91 IU/mL — ABNORMAL HIGH (ref 0.30–0.70)
Heparin Unfractionated: 0.96 IU/mL — ABNORMAL HIGH (ref 0.30–0.70)

## 2018-12-26 MED ORDER — PERFLUTREN LIPID MICROSPHERE
1.0000 mL | INTRAVENOUS | Status: AC | PRN
  Filled 2018-12-26: qty 10

## 2018-12-26 NOTE — Progress Notes (Addendum)
Progress Note   Subjective   Confused, denies CP or SOB  Inpatient Medications    Scheduled Meds: . aspirin EC  81 mg Oral Daily  . carvedilol  18.75 mg Oral BID WC  . furosemide  40 mg Intravenous Once  . losartan  50 mg Oral Daily  . QUEtiapine  25 mg Oral QHS  . rosuvastatin  40 mg Oral q1800  . senna  1 tablet Oral QHS   Continuous Infusions: . heparin 1,250 Units/hr (12/26/18 0647)   PRN Meds: acetaminophen, nitroGLYCERIN, ondansetron (ZOFRAN) IV, simethicone   Vital Signs    Vitals:   12/26/18 0013 12/26/18 0544 12/26/18 0807 12/26/18 0935  BP: 114/64 113/68 125/88 119/88  Pulse: 87 73 73   Resp: (!) 32 (!) 35 (!) 30   Temp: 98.5 F (36.9 C) 98.4 F (36.9 C) 99.4 F (37.4 C)   TempSrc: Axillary Axillary Oral   SpO2:  96% 94%   Weight:  83.2 kg    Height:        Intake/Output Summary (Last 24 hours) at 12/26/2018 1205 Last data filed at 12/26/2018 0300 Gross per 24 hour  Intake 351.24 ml  Output -  Net 351.24 ml   Filed Weights   12/24/18 1701 12/25/18 0615 12/26/18 0544  Weight: 80 kg 83.6 kg 83.2 kg    Telemetry    afib- Personally Reviewed  Physical Exam   GEN- The patient is elderly appearing, alert but confused Head- normocephalic, atraumatic Eyes-  Sclera clear, conjunctiva pink Ears- hearing intact Oropharynx- clear Neck- supple, Lungs- Clear to ausculation bilaterally, normal work of breathing Heart- irregular rate and rhythm  GI- soft, NT, ND, + BS Extremities- no clubbing, cyanosis, or edema  MS- diffuse atrophy Skin- diffuse skin ecchymosis Psych- pleasant but confused Neuro- strength and sensation are intact   Labs    Chemistry Recent Labs  Lab 12/24/18 0543 12/25/18 0411  NA 134* 134*  K 3.6 4.0  CL 100 103  CO2 24 21*  GLUCOSE 133* 127*  BUN 16 12  CREATININE 0.78 0.81  CALCIUM 9.3 8.9  PROT 8.1  --   ALBUMIN 3.9  --   AST 37  --   ALT 16  --   ALKPHOS 67  --   BILITOT 1.0  --   GFRNONAA >60 >60  GFRAA  >60 >60  ANIONGAP 10 10     Hematology Recent Labs  Lab 12/24/18 0543 12/25/18 0411 12/26/18 0517  WBC 8.5 6.8 6.1  RBC 4.09 3.87 3.53*  HGB 12.6 11.9* 11.1*  HCT 39.5 36.8 33.5*  MCV 96.6 95.1 94.9  MCH 30.8 30.7 31.4  MCHC 31.9 32.3 33.1  RDW 12.8 12.8 13.2  PLT 213 184 177    Cardiac Enzymes Recent Labs  Lab 12/24/18 0543 12/24/18 1202  TROPONINI 5.02* 4.81*   No results for input(s): TROPIPOC in the last 168 hours.    Patient Profile   [name] a 85 y.o.femalewith past medical history of persistent atrial fibrillation (on Coumadin), chronic diastolic CHF (EF 70-96% by echo in 12/2015), and HTN admitted with NSTEMI.  She has had intermittent confusion and currently does not wish for further CV testing.    Assessment & Plan    1.  NSTEMI Currently pain free She has declined echo and is not interested in cath at this time. Medical therapy would seem to be very reasonable given her advanced age, fragility, and wishes to avoid procedures.  2. Confusion She has  intermittent confusion.  I worry about her living at home.  She is clear that she does not wish to be in the hospital and does not wish to have additional tests, procedures.  I think that it would be very prudent to consult palliative care to help the patient and her family establish goals of care.  Ultimately, a conservative/ palliative approach is probably best for her.  3. Persistent afib Rate controlled On coumadin chronically  4. Chronic diastolic dysfunction Stable No change required today  5. Deconditioning She wishes to return to her home.  I worry that this may not be reasonable.  We will consult physical therapy to assess her home needs.   Hillis Range MD, [facility] 12/26/2018 12:05 PM

## 2018-12-26 NOTE — Progress Notes (Signed)
Pt spiked temp 102.0 rectal. BP 109/68. Pt asymptomatic-states she just feels tired. Urine dark and cloudy in [place] canister. Dr Mayford Knife on call and notified via amion of above. Dierdre Highman, RN

## 2018-12-26 NOTE — Progress Notes (Signed)
ANTICOAGULATION CONSULT NOTE   Pharmacy Consult for heparin Indication: NSTEMI   Patient Measurements: Height: 5\' 6"  (167.6 cm) Weight: 183 lb 6.4 oz (83.2 kg) IBW/kg (Calculated) : 59.3    Vital Signs: Temp: 98.4 F (36.9 C) (03/08 0544) Temp Source: Axillary (03/08 0544) BP: 113/68 (03/08 0544) Pulse Rate: 73 (03/08 0544)  Labs: Recent Labs    12/24/18 0543 12/24/18 1202  12/24/18 1408 12/25/18 0411 12/25/18 0928 12/26/18 0517  HGB 12.6  --   --   --  11.9*  --  11.1*  HCT 39.5  --   --   --  36.8  --  33.5*  PLT 213  --   --   --  184  --  177  LABPROT 23.0*  --   --  22.6* 22.5*  --   --   INR 2.1*  --   --  2.0* 2.0*  --   --   HEPARINUNFRC  --   --    < > <0.10* <0.10* 0.28* 0.91*  CREATININE 0.78  --   --   --  0.81  --   --   TROPONINI 5.02* 4.81*  --   --   --   --   --    < > = values in this interval not displayed.    Estimated Creatinine Clearance: 56.2 mL/min (by C-G formula based on SCr of 0.81 mg/dL).   Assessment: Pharmacy consulted to dose heparin in patient with NSTEMI.  She was on coumadin PTA  Heparin level this am elevated 0.91 units/ml.  No issues noted with IV, no bleeding reported. Goal of Therapy:  Heparin level 0.3-0.7 units/ml Monitor platelets by anticoagulation protocol: Yes   Plan:  Derease heparin infusion slightly to 1250 units/hr. Check heparin level 8 hours after rate change Check anti-Xa level daily while on heparin. Continue to monitor H&H and platelets. Thanks for allowing pharmacy to be a part of this patient's care.  Talbert Cage, PharmD Clinical Pharmacist 12/26/2018,6:43 AM

## 2018-12-26 NOTE — Progress Notes (Signed)
Echocardiogram 2D Echocardiogram has been performed.  [name] 12/26/2018, 2:53 PM

## 2018-12-26 NOTE — Progress Notes (Signed)
ANTICOAGULATION CONSULT NOTE   Pharmacy Consult for heparin Indication: NSTEMI   Patient Measurements: Height: 5\' 6"  (167.6 cm) Weight: 183 lb 6.4 oz (83.2 kg) IBW/kg (Calculated) : 59.3    Vital Signs: Temp: 99.4 F (37.4 C) (03/08 0807) Temp Source: Oral (03/08 0807) BP: 119/88 (03/08 0935) Pulse Rate: 73 (03/08 0807)  Labs: Recent Labs    12/24/18 0543 12/24/18 1202  12/24/18 1408 12/25/18 0411 12/25/18 0928 12/26/18 0517 12/26/18 1509  HGB 12.6  --   --   --  11.9*  --  11.1*  --   HCT 39.5  --   --   --  36.8  --  33.5*  --   PLT 213  --   --   --  184  --  177  --   LABPROT 23.0*  --   --  22.6* 22.5*  --   --   --   INR 2.1*  --   --  2.0* 2.0*  --   --   --   HEPARINUNFRC  --   --    < > <0.10* <0.10* 0.28* 0.91* 0.96*  CREATININE 0.78  --   --   --  0.81  --   --   --   TROPONINI 5.02* 4.81*  --   --   --   --   --   --    < > = values in this interval not displayed.    Estimated Creatinine Clearance: 56.2 mL/min (by C-G formula based on SCr of 0.81 mg/dL).   Medical History: Past Medical History:  Diagnosis Date  . Atrial fibrillation (HCC)    a. persistent, on Coumadin for anticoagulation.   . CHF (congestive heart failure) (HCC)    a. EF reduced to 30% by echo in 12/2010 b. EF 50% in 2014 c. improved to 55-60% by repeat imaging in 12/2015  . Chronic airway obstruction, not elsewhere classified   . Esophageal reflux   . Hernia   . Hypertension   . Hyposmolality and/or hyponatremia   . Mitral regurgitation   . Moderate tricuspid regurgitation by prior echocardiogram   . Morbid obesity (HCC)   . Osteoarthrosis, unspecified whether generalized or localized, unspecified site     Medications:  Medications Prior to Admission  Medication Sig Dispense Refill Last Dose  . acetaminophen (TYLENOL) 500 MG tablet Take 500 mg by mouth as needed for mild pain. Taking as needed; only about 2x/week.   Past Week at Unknown time  . [facility] SENNA 8.6 MG tablet TAKE  1 TABLET BY MOUTH EVERY DAY 30 tablet 8 12/23/2018 at Unknown time  . Cyanocobalamin (B-12 PO) Take by mouth daily. Uses dropper to administer. Unsure about qty.   12/23/2018 at Unknown time  . fluticasone (FLONASE) 50 MCG/ACT nasal spray SPRAY 2 SPRAYS INTO EACH NOSTRIL EVERY DAY 16 g 3 12/23/2018 at Unknown time  . furosemide (LASIX) 20 MG tablet TAKE 1 TABLET BY MOUTH EVERY DAY (Patient taking differently: Take 40 mg by mouth daily. ) 90 tablet 0 12/23/2018 at Unknown time  . losartan (COZAAR) 25 MG tablet Take 25 mg by mouth daily as needed (when blood pressure is not controlled by losartan 50mg ).    Past Month at Unknown time  . losartan (COZAAR) 50 MG tablet TAKE 1 TABLET (50 MG TOTAL) BY MOUTH DAILY. 90 tablet 3 12/23/2018 at Unknown time  . Multiple Vitamins-Minerals (PRESERVISION AREDS 2+MULTI VIT PO) Take 1 capsule by mouth 2 (two)  times daily.   12/23/2018 at Unknown time  . neomycin-bacitracin-polymyxin (NEOSPORIN) ointment Apply 1 application topically daily.   12/23/2018 at Unknown time  . polyethylene glycol powder (GLYCOLAX/MIRALAX) powder Take 17 g by mouth daily as needed (for laxative/constipation). 527 g 3 Taking  . Saline (SIMPLY SALINE) 0.9 % AERS Place 1 spray into the nose daily as needed (for congestion).   12/23/2018 at Unknown time  . simethicone (MYLICON) 80 MG chewable tablet Chew 80 mg by mouth as needed for flatulence (takes 2-3 times daily as needed).     . triamcinolone cream (KENALOG) 0.1 % Apply topically at bedtime. 454 g 3 12/23/2018 at Unknown time  . warfarin (COUMADIN) 5 MG tablet TAKE 1/2 TO 1 TABLET DAILY AS DIRECTED BY COUMADIN CLINIC (Patient taking differently: Take 2.5-5 mg by mouth daily. Take 1 tablet by mouth on Tuesdays and Saturdays. On other days, take 1/2 tablet.) 90 tablet 1 12/23/2018 at 2300    Assessment: Pharmacy consulted to dose heparin in patient with NSTEMI.  Patient is on warfarin PTA - INR 2.1.   Heparin level is therapeutic at 0.96, on 1250 units/hr. INR  3/7therapeuticat 2, despite no dose on 3/6 (last dose on 3/5). Hgb 11.1, plt 177. Troponin 5.02>4.81. No s/sx of bleeding. No infusion issues this morning.  Goal of Therapy:  Heparin level 0.3-0.7 units/ml Monitor platelets by anticoagulation protocol: Yes   Plan:  Increase heparin infusion slightly to 1100 units/hr. Check anti-Xa level daily while on heparin. Continue to monitor H&H and platelets.  Sherron Monday, PharmD, BCCCP Clinical Pharmacist  Pager: [phone] Phone: [phone] 12/26/2018,4:03 PM

## 2018-12-26 NOTE — Consult Note (Signed)
Palliative Medicine   Name: [name] Date: 12/26/2018 MRN: [id number]  DOB: [birth year]  Patient Care Team: Patient, No Pcp Per as PCP - General (General Practice) Rollene Rotunda, MD as PCP - Cardiology (Cardiology)    REASON FOR CONSULTATION: Palliative Care consult requested for this 85 y.o. female with multiple medical problems including atrial fibrillation anticoagulated on Coumadin, chronic diastolic dysfunction with history of CHF, hypertension who was admitted on 12/24/2018 with non-STEMI and acute on chronic diastolic CHF.  Patient has reportedly refused cardiac catheterization and echo and is verbalized desire to go home.  Palliative care was consulted to help address goals.  SOCIAL HISTORY:    Patient is widowed.  She has a son and daughter in the [place] area.  Patient lives at [facility] in a senior apartment.  ADVANCE DIRECTIVES:  Not on file.  Son reportedly is her healthcare power of attorney.  CODE STATUS: Full code  PAST MEDICAL HISTORY: Past Medical History:  Diagnosis Date  . Atrial fibrillation (HCC)    a. persistent, on Coumadin for anticoagulation.   . CHF (congestive heart failure) (HCC)    a. EF reduced to 30% by echo in 12/2010 b. EF 50% in 2014 c. improved to 55-60% by repeat imaging in 12/2015  . Chronic airway obstruction, not elsewhere classified   . Esophageal reflux   . Hernia   . Hypertension   . Hyposmolality and/or hyponatremia   . Mitral regurgitation   . Moderate tricuspid regurgitation by prior echocardiogram   . Morbid obesity (HCC)   . Osteoarthrosis, unspecified whether generalized or localized, unspecified site     PAST SURGICAL HISTORY:  Past Surgical History:  Procedure Laterality Date  . ABDOMINAL HYSTERECTOMY    . Right lower extremity surgery      HEMATOLOGY/ONCOLOGY HISTORY:   No history exists.    ALLERGIES:  is allergic to aspirin; garlic; and penicillins.  MEDICATIONS:  Current  Facility-Administered Medications  Medication Dose Route Frequency Provider Last Rate Last Dose  . acetaminophen (TYLENOL) tablet 650 mg  650 mg Oral Q4H PRN Strader, [place] M, PA-C      . aspirin EC tablet 81 mg  81 mg Oral Daily Randall An M, PA-C   81 mg at 12/26/18 0935  . carvedilol (COREG) tablet 18.75 mg  18.75 mg Oral BID WC Strader, [place] M, PA-C   18.75 mg at 12/26/18 0935  . furosemide (LASIX) injection 40 mg  40 mg Intravenous Once Strader, [place] M, PA-C      . heparin ADULT infusion 100 units/mL (25000 units/228mL sodium chloride 0.45%)  1,250 Units/hr Intravenous Continuous Antoine Poche, MD 12.5 mL/hr at 12/26/18 0647 1,250 Units/hr at 12/26/18 0647  . losartan (COZAAR) tablet 50 mg  50 mg Oral Daily Randall An M, PA-C   50 mg at 12/26/18 0935  . nitroGLYCERIN (NITROSTAT) SL tablet 0.4 mg  0.4 mg Sublingual Q5 Min x 3 PRN Strader, Brittany M, PA-C      . ondansetron [facility]) injection 4 mg  4 mg Intravenous Q6H PRN Strader, [place] M, PA-C      . perflutren lipid microspheres (DEFINITY) IV suspension  1-10 mL Intravenous PRN Chrystie Nose, MD      . QUEtiapine (SEROQUEL) tablet 25 mg  25 mg Oral QHS Coniglio, Harlen Labs, MD   25 mg at 12/25/18 2104  . rosuvastatin (CRESTOR) tablet 40 mg  40 mg Oral q1800 Ellsworth Lennox, PA-C   40 mg at 12/25/18  1733  . senna (SENOKOT) tablet 8.6 mg  1 tablet Oral QHS Randall An M, PA-C   8.6 mg at 12/25/18 2104  . simethicone (MYLICON) chewable tablet 80 mg  80 mg Oral QID PRN Leone Brand, NP   80 mg at 12/24/18 1734    VITAL SIGNS: BP 119/88   Pulse 73   Temp 99.4 F (37.4 C) (Oral)   Resp (!) 30   Ht 5\' 6"  (1.676 m)   Wt 83.2 kg   SpO2 94%   BMI 29.60 kg/m  Filed Weights   12/24/18 1701 12/25/18 0615 12/26/18 0544  Weight: 80 kg 83.6 kg 83.2 kg    Estimated body mass index is 29.6 kg/m as calculated from the following:   Height as of this encounter: 5\' 6"  (1.676 m).   Weight as of this  encounter: 83.2 kg.  LABS: CBC:    Component Value Date/Time   WBC 6.1 12/26/2018 0517   HGB 11.1 (L) 12/26/2018 0517   HGB 13.0 02/18/2017 1110   HCT 33.5 (L) 12/26/2018 0517   HCT 39.6 02/18/2017 1110   PLT 177 12/26/2018 0517   PLT 260 02/18/2017 1110   MCV 94.9 12/26/2018 0517   MCV 91 02/18/2017 1110   NEUTROABS 5.5 12/24/2018 0543   LYMPHSABS 1.9 12/24/2018 0543   MONOABS 0.9 12/24/2018 0543   EOSABS 0.2 12/24/2018 0543   BASOSABS 0.0 12/24/2018 0543   Comprehensive Metabolic Panel:    Component Value Date/Time   NA 134 (L) 12/25/2018 0411   NA 137 02/18/2017 1110   K 4.0 12/25/2018 0411   CL 103 12/25/2018 0411   CO2 21 (L) 12/25/2018 0411   BUN 12 12/25/2018 0411   BUN 23 02/18/2017 1110   CREATININE 0.81 12/25/2018 0411   GLUCOSE 127 (H) 12/25/2018 0411   CALCIUM 8.9 12/25/2018 0411   AST 37 12/24/2018 0543   ALT 16 12/24/2018 0543   ALKPHOS 67 12/24/2018 0543   BILITOT 1.0 12/24/2018 0543   PROT 8.1 12/24/2018 0543   ALBUMIN 3.9 12/24/2018 0543    RADIOGRAPHIC STUDIES: Dg Chest Portable 1 View  Result Date: 12/24/2018 CLINICAL DATA:  Shortness of breath EXAM: PORTABLE CHEST 1 VIEW COMPARISON:  06/17/2018 FINDINGS: Chronic cardiomegaly. Cephalized blood flow and interstitial coarsening above prior baseline. Possible trace effusions. No asymmetric opacity or air bronchograms. Chronic large lung volumes. IMPRESSION: Mild CHF. Electronically Signed   By: Marnee Spring M.D.   On: 12/24/2018 07:13   Dg Abd Portable 2 Views  Result Date: 12/24/2018 CLINICAL DATA:  Shortness of breath EXAM: PORTABLE ABDOMEN - 2 VIEW COMPARISON:  None. FINDINGS: Nonobstructive bowel gas pattern. No abnormal stool retention. No concerning calcification or mass effect. Hip osteoarthritis with asymmetric advanced right narrowing. Lumbar levoscoliosis and degeneration. IMPRESSION: Nonobstructive bowel gas pattern. Electronically Signed   By: Marnee Spring M.D.   On: 12/24/2018 07:14    Ct Angio Chest/abd/pel For Dissection W And/or Wo Contrast  Result Date: 12/24/2018 CLINICAL DATA:  Chest pain with acute aortic syndrome suspected EXAM: CT ANGIOGRAPHY CHEST, ABDOMEN AND PELVIS TECHNIQUE: Multidetector CT imaging through the chest, abdomen and pelvis was performed using the standard protocol during bolus administration of intravenous contrast. Multiplanar reconstructed images and MIPs were obtained and reviewed to evaluate the vascular anatomy. CONTRAST:  OMNIPAQUE IOHEXOL 350 MG/ML SOLN COMPARISON:  01/16/2011 chest CT.  01/22/2011 abdominal CT FINDINGS: CTA CHEST FINDINGS Cardiovascular: Noncontrast chest CT shows no acute intramural hematoma. There is extensive atherosclerotic calcification of  the aorta and coronaries. Cardiomegaly. No pericardial effusion. Non opacified left atrial appendage. Postcontrast imaging shows no acute aortic dissection. There is an irregular atherosclerotic plaque along the posterior wall of the descending aorta without superimposed inflammation. Mediastinum/Nodes: Negative for adenopathy or hematoma. Lungs/Pleura: Interlobular septal thickening at the right base, likely mild edema. Trace right pleural effusion. 6 mm left upper lobe pulmonary nodule that is stable from 2012. Musculoskeletal: Degenerative changes without acute or aggressive finding Review of the MIP images confirms the above findings. CTA ABDOMEN AND PELVIS FINDINGS VASCULAR Aorta: Diffuse atherosclerotic plaque of the aorta with diffuse fusiform dilatation. Just below the renal arteries is maximal aneurysmal enlargement to 3.8 cm. This segment measured 3 cm previously. No dissection or inflammatory wall thickening. No evidence of rupture or impending rupture. Celiac: Atherosclerotic plaque with ostial stenosis that is at least moderate. No acute finding SMA: Atherosclerotic plaque at the origin with moderate to advanced stenosis. No branch occlusion Renals: Advanced atheromatous narrowing  of the proximal right renal artery but symmetric renal enhancement. Poor flow in left segmental branch vessel with cortical scarring. IMA: Patent Inflow: Atherosclerotic plaque without aneurysm.Extensive atherosclerotic irregularity and moderate to advanced right more than left narrowing of the superficial femoral arteries. Veins: No emergent finding in the arterial phase Review of the MIP images confirms the above findings. NON-VASCULAR Hepatobiliary: No focal liver abnormality.No evidence of biliary obstruction or stone. Pancreas: Generalized atrophy. Spleen: Unremarkable. Adrenals/Urinary Tract: Negative adrenals. No hydronephrosis. Advanced left renal cortical scarring, segmental and likely infarct. Unremarkable bladder. Stomach/Bowel:  No obstruction. No evidence of inflammation Lymphatic: No mass or adenopathy. Reproductive:Hysterectomy Other: No ascites or pneumoperitoneum. Musculoskeletal: Advanced lumbar spine degeneration with scoliosis. Severe hip osteoarthritis with protrusio deformity on the right Review of the MIP images confirms the above findings. IMPRESSION: 1. No evidence of acute aortic syndrome. 2. Extensive atherosclerosis. There is moderate to advanced narrowing at the origins of the celiac and SMA. 3. 3.8 cm abdominal aortic aneurysm. If appropriate for comorbidities, recommend followup by ultrasound in 2 years. This recommendation follows ACR consensus guidelines: White Paper of the ACR Incidental Findings Committee II on Vascular Findings. J Am Coll Radiol 2013; 10:789-794. 4. Non opacified left atrial appendage correlating with history of atrial fibrillation. This could be delayed mixing or thrombus. 5. Cardiomegaly with borderline pulmonary edema and trace right pleural effusion. Electronically Signed   By: Marnee Spring M.D.   On: 12/24/2018 09:02    PERFORMANCE STATUS (ECOG) : 2 - Symptomatic, <50% confined to bed  REVIEW OF SYSTEMS: As noted above. Otherwise, a complete review  of systems is negative.  PHYSICAL EXAM: General: NAD, frail appearing Cardiovascular: Irregular Pulmonary: clear ant fields Abdomen: soft, nontender, + bowel sounds GU: no suprapubic tenderness Extremities: no edema, no joint deformities Skin: no rashes Neurological: Lethargic, some confusion  IMPRESSION: Patient is lethargic and had difficulty staying awake during my conversation with her.  I note that she received lorazepam and Seroquel last night.  Will discontinue Seroquel.  I called and spoke with patient's son.  He says that prior to this hospitalization patient was living in a senior living apartment.  She uses an Mining engineer wheelchair and requires assistance with bathing and dressing.  Son attributes patient's decision not to pursue work-up or treatment due to her confusion.  He says that patient has historically been extremely sensitive to sedating medications.  He requests that sedating medications be held.  Again, will discontinue Seroquel.  We discussed goals related to her current care.  He would be agreeable with patient receiving cardiac catheterization if it was felt to be clinically prudent.  He would be interested in speaking with cardiology about options.  He thinks that patient would be agreeable once her confusion resolves.  PT consult pending.  Son says that patient has long feared going to a "nursing home" but that family would consider that option if necessary.  We discussed CODE STATUS.  Son would like patient to have an attempt made at resuscitation but would not want patient prolonged long-term on machines.  PLAN: -Continue current scope of treatment -We will discontinue Seroquel due to sedation -PT consult pending -Would recommend rehab with palliative care following -Family would like an attempt made at resuscitation if needed -We will follow   Time Total: 60 minutes  Visit consisted of counseling and education dealing with the complex and emotionally  intense issues of symptom management and palliative care in the setting of serious and potentially life-threatening illness.Greater than 50%  of this time was spent counseling and coordinating care related to the above assessment and plan.  Signed by: Laurette Schimke, PhD, NP-C [phone] (Work Cell)

## 2018-12-27 ENCOUNTER — Encounter (HOSPITAL_COMMUNITY)
Admission: EM | Disposition: A | Discharge status: Home / Self Care | Payer: Self-pay | Source: Home / Self Care | Attending: Cardiology

## 2018-12-27 DIAGNOSIS — I255 Ischemic cardiomyopathy: Secondary | ICD-10-CM

## 2018-12-27 DIAGNOSIS — R410 Disorientation, unspecified: Secondary | ICD-10-CM

## 2018-12-27 DIAGNOSIS — I5021 Acute systolic (congestive) heart failure: Secondary | ICD-10-CM

## 2018-12-27 DIAGNOSIS — Z7901 Long term (current) use of anticoagulants: Secondary | ICD-10-CM

## 2018-12-27 LAB — PROTIME-INR
INR: 1.6 — ABNORMAL HIGH (ref 0.8–1.2)
Prothrombin Time: 18.9 seconds — ABNORMAL HIGH (ref 11.4–15.2)

## 2018-12-27 LAB — BASIC METABOLIC PANEL
Anion gap: 12 (ref 5–15)
BUN: 26 mg/dL — ABNORMAL HIGH (ref 8–23)
CO2: 19 mmol/L — AB (ref 22–32)
Calcium: 7.9 mg/dL — ABNORMAL LOW (ref 8.9–10.3)
Chloride: 101 mmol/L (ref 98–111)
Creatinine, Ser: 0.94 mg/dL (ref 0.44–1.00)
GFR calc non Af Amer: 56 mL/min — ABNORMAL LOW (ref >60)
Glucose, Bld: 101 mg/dL — ABNORMAL HIGH (ref 70–99)
Potassium: 3.5 mmol/L (ref 3.5–5.1)
Sodium: 132 mmol/L — ABNORMAL LOW (ref 135–145)

## 2018-12-27 LAB — URINALYSIS, ROUTINE W REFLEX MICROSCOPIC
Bilirubin Urine: NEGATIVE
GLUCOSE, UA: NEGATIVE mg/dL
Hgb urine dipstick: NEGATIVE
Ketones, ur: NEGATIVE mg/dL
Nitrite: NEGATIVE
Protein, ur: 30 mg/dL — AB
Specific Gravity, Urine: 1.027 (ref 1.005–1.030)
pH: 5 (ref 5.0–8.0)

## 2018-12-27 LAB — HEPARIN LEVEL (UNFRACTIONATED)
Heparin Unfractionated: 0.65 IU/mL (ref 0.30–0.70)
Heparin Unfractionated: 0.96 IU/mL — ABNORMAL HIGH (ref 0.30–0.70)

## 2018-12-27 LAB — CBC
HCT: 31.8 % — ABNORMAL LOW (ref 36.0–46.0)
Hemoglobin: 10.5 g/dL — ABNORMAL LOW (ref 12.0–15.0)
MCH: 31.3 pg (ref 26.0–34.0)
MCHC: 33 g/dL (ref 30.0–36.0)
MCV: 94.6 fL (ref 80.0–100.0)
Platelets: 165 10*3/uL (ref 150–400)
RBC: 3.36 MIL/uL — AB (ref 3.87–5.11)
RDW: 13.2 % (ref 11.5–15.5)
WBC: 5.4 10*3/uL (ref 4.0–10.5)
nRBC: 0 % (ref 0.0–0.2)

## 2018-12-27 SURGERY — LEFT HEART CATH AND CORONARY ANGIOGRAPHY
Anesthesia: LOCAL

## 2018-12-27 MED ORDER — WARFARIN - PHARMACIST DOSING INPATIENT: Freq: Every day | Status: DC

## 2018-12-27 MED ORDER — WARFARIN SODIUM 5 MG PO TABS
5.0000 mg | ORAL_TABLET | Freq: Once | ORAL | Status: AC
  Administered 2018-12-27: 5 mg via ORAL
  Filled 2018-12-27: qty 1

## 2018-12-27 MED ORDER — CALCIUM CARBONATE ANTACID 500 MG PO CHEW
200.0000 mg | CHEWABLE_TABLET | Freq: Two times a day (BID) | ORAL | Status: DC
  Administered 2018-12-27 – 2018-12-28 (×4): 200 mg via ORAL
  Filled 2018-12-27 (×4): qty 1

## 2018-12-27 MED ORDER — FUROSEMIDE 40 MG PO TABS
40.0000 mg | ORAL_TABLET | Freq: Every day | ORAL | Status: DC
  Administered 2018-12-27 – 2018-12-28 (×2): 40 mg via ORAL
  Filled 2018-12-27 (×2): qty 1

## 2018-12-27 MED ORDER — CARVEDILOL 12.5 MG PO TABS
12.5000 mg | ORAL_TABLET | Freq: Two times a day (BID) | ORAL | Status: DC
  Administered 2018-12-27 – 2018-12-28 (×3): 12.5 mg via ORAL
  Filled 2018-12-27 (×3): qty 1

## 2018-12-27 MED ORDER — ONDANSETRON 4 MG PO TBDP
4.0000 mg | ORAL_TABLET | Freq: Three times a day (TID) | ORAL | Status: DC | PRN
  Filled 2018-12-27: qty 1

## 2018-12-27 MED ORDER — PROSIGHT PO TABS
ORAL_TABLET | Freq: Two times a day (BID) | ORAL | Status: DC
  Administered 2018-12-27 – 2018-12-28 (×4): 1 via ORAL
  Filled 2018-12-27 (×6): qty 1

## 2018-12-27 MED ORDER — ONDANSETRON HCL 4 MG/2ML IJ SOLN
4.0000 mg | Freq: Once | INTRAMUSCULAR | Status: AC
  Administered 2018-12-27: 4 mg via INTRAVENOUS
  Filled 2018-12-27: qty 2

## 2018-12-27 MED ORDER — PANTOPRAZOLE SODIUM 40 MG PO TBEC
40.0000 mg | DELAYED_RELEASE_TABLET | Freq: Every day | ORAL | Status: DC
  Administered 2018-12-27 – 2018-12-28 (×2): 40 mg via ORAL
  Filled 2018-12-27 (×2): qty 1

## 2018-12-27 NOTE — Progress Notes (Addendum)
Daily Progress Note   Patient Name: [name]       Date: 12/27/2018 DOB: May 22, 1932  Age: 85 y.o. MRN#: [id number] Attending Physician: Antoine Poche, MD Primary Care Physician: Patient, No Pcp Per Admit Date: 12/24/2018  Reason for Consultation/Follow-up: Establishing goals of care and Psychosocial/spiritual support  Subjective: Patient is on the bedside commode on the telephone.  Is not inclined to hang up the phone when I came into her room.  I spoke with her son (and surrogate medical decision maker) Gene on the phone.  Per Gene his mother "[name]" is alert and orientated today and she is still refusing all medical work up.  Gene feels that this is because she is mad about the CXR (in [place]) where she was made to lie flat after she said she could not lie flat.    Per Gene, Dr. Antoine Poche is [name]'s Superman.  She would do anything for him.  She had already agreed to having a cath with him.   Gene feels that after she gets home and comfortable she will allow Dr. Antoine Poche to take care of her.      Per Gene his mother lives in an independent living facility.  She rides around the facility in her power chair and enjoys playing cards and bingo with the other residents.  She has an aide that checks on her in the evening and helps with things around the house.  I went back to [name]'s room and attempted to see her again.  She is continually burping and complaining of feeling as though she is going to vomit.  She is also complaining of feeling sore all over.    I'm unable to have a conversation with her due to almost continuous eructation.   I'm concerned that this may be gas or it may be her anginal equivalent.  Spoke with [facility] Cardiology NP who was also concerned that the peptic upset may be  her anginal equivalent.  Ordered tums, mylicon, and protonix.   Assessment:  85 yo patient with Nstemi on admission.  Refusing catheterization.  Wants to go home and consider things before making a decision about catheterization.  Now with fairly severe eructation and peptic symptoms.  Still a full code.  Patient unable to have a conversation with me at this time.  Patient Profile/HPI:  Palliative Care consult requested for this 85 y.o. female with multiple medical problems including atrial fibrillation anticoagulated on Coumadin, chronic diastolic dysfunction with history of CHF, hypertension who was admitted on 12/24/2018 with non-STEMI and acute on chronic diastolic CHF.  Patient has reportedly refused cardiac catheterization and echo and is verbalized desire to go home.  Palliative care was consulted to help address goals.   Length of Stay: 3  Current Medications: Scheduled Meds:  . aspirin EC  81 mg Oral Daily  . carvedilol  12.5 mg Oral BID WC  . furosemide  40 mg Oral Daily  . losartan  50 mg Oral Daily  . multivitamin   Oral BID  . rosuvastatin  40 mg Oral q1800  . senna  1 tablet Oral QHS  . warfarin  5 mg Oral ONCE-1800  . Warfarin - Pharmacist Dosing Inpatient   Does not apply q1800    Continuous Infusions: . heparin 900 Units/hr (12/27/18 0723)    PRN Meds: acetaminophen, nitroGLYCERIN, ondansetron (ZOFRAN) IV, simethicone  Physical Exam       Elderly female awake, alert, orientated, clear speech, with very frequent eructation. Sitting on bedside commode. Lower extremities - 1+ edema, able to bear weight.  Vital Signs: BP (!) 91/58   Pulse (!) 58   Temp 98.1 F (36.7 C) (Oral)   Resp 20   Ht  (1.676 m)   Wt 81.2 kg   SpO2 96%   BMI 28.91 kg/m  SpO2: SpO2: 96 % O2 Device: O2 Device: Nasal Cannula O2 Flow Rate: O2 Flow Rate (L/min): 2 L/min  Intake/output summary:   Intake/Output Summary (Last 24 hours) at 12/27/2018 1454 Last data filed at 12/27/2018  0900 Gross per 24 hour  Intake 540 ml  Output 300 ml  Net 240 ml   LBM:   Baseline Weight: Weight: 77.1 kg Most recent weight: Weight: 81.2 kg       Palliative Assessment/Data: 50%    Flowsheet Rows     Most Recent Value  Intake Tab  Referral Department  Cardiology  Unit at Time of Referral  Cardiac/Telemetry Unit  Palliative Care Primary Diagnosis  Cardiac  Date Notified  12/26/18  Palliative Care Type  New Palliative care  Reason for referral  Clarify Goals of Care  Date of Admission  12/24/18  Date first seen by Palliative Care  12/26/18  # of days Palliative referral response time  0 Day(s)  # of days IP prior to Palliative referral  2  Clinical Assessment  Psychosocial & Spiritual Assessment  Palliative Care Outcomes      Patient Active Problem List   Diagnosis Date Noted  . Confusion   . Acute systolic heart failure (HCC)   . Ischemic cardiomyopathy   . Palliative care encounter   . NSTEMI (non-ST elevated myocardial infarction) (HCC) 12/24/2018  . RBBB 02/24/2018  . Encounter for therapeutic drug monitoring 12/16/2013  . Tachycardia 01/14/2012  . Moderate tricuspid regurgitation by prior echocardiogram   . CHF (congestive heart failure) (HCC)   . Hypertension   . Morbid obesity (HCC)   . Mitral regurgitation   . Atrial fibrillation (HCC) 01/31/2011  . Long term (current) use of anticoagulants 01/31/2011    Palliative Care Plan    Recommendations/Plan:  Will order tums, mylicon, protonix   PMT will continue to follow.  Goals of Care and Additional Recommendations:  Limitations on Scope of Treatment: Full Scope Treatment  Code Status:  Full code  Prognosis:  Unable to determine   Discharge Planning:  Home with Home Health.  Care plan was discussed with her son, Cardiology NP  Thank you for allowing the Palliative Medicine Team to assist in the care of this patient.  Total time spent:  35 min.     Greater than 50%  of this time  was spent counseling and coordinating care related to the above assessment and plan.  Norvel Richards, PA-C Palliative Medicine  Please contact Palliative MedicineTeam phone at [phone] for questions and concerns between 7 am - 7 pm.   Please see AMION for individual provider pager numbers.

## 2018-12-27 NOTE — Progress Notes (Signed)
Pt is requesting something for sleep. Explained to patient she received something last night and she became very confused and difficult to arouse so medication was discontinued. I also informed patient that her family has requested no sedating medications unless they are notified.first. Pt is alert and oriented x4, at this time she is also stating she DOES NOT want any invasive testing or procedures done at this time. Will continue to monitor. Dierdre Highman, RN

## 2018-12-27 NOTE — Progress Notes (Signed)
Received a call back from patient's son [name].  We talked for another 20 min.  [name] understands the severity of what is happening with his mother.  He asked me to impress upon her the importance of not going home until a medical work up was completed (including a catheterization).  He suggested having Dr. Antoine Poche call her to encourage her to have the Cath done.  I spoke with [name] at bedside once again.  Her dyspepsia had improved and she was very coherent.  We talked about what was going on with her heart and my concern that her stomach problems may be related to her heart.  I also expressed concern that she could go home to rest and think and have a large heart attack.   After some further conversation she explained to me - "I'm exhausted.  I'm sore all over.  I'm in no shape to be able to undergo a procedure right now.  I understand what you're saying that I may go home and the Lord may take me.  I still want to go home. I didn't get to be 86 by being stupid."   She talked with me about the fact that she considers Dr. Antoine Poche her PCP.  According to "[name]" Dr. Antoine Poche encouraged her to have a mammogram.  She responded "I've had 2 husbands and 6 children, I think my boobs have done pretty well and I'm going to leave them alone."  [name] states that her recipe for recovery will be potato - onion soup, 1-2 hours of fresh air a day and rest.    We discussed code status.  [name] stated she does not want cardiac or respiratory resuscitation.  "I would like to live, but if I die then let me go on".   Recommendations:   Code status changed to DNR.  Please send a gold DNR form with her on discharge.  Patient does not handle sedating medications well.  Would veer away from any sedating medications  DC home with close follow up by Dr. Antoine Poche.   Norvel Richards, PA-C Palliative Medicine Pager: [phone]

## 2018-12-27 NOTE — Progress Notes (Signed)
SATURATION QUALIFICATIONS: (This note is used to comply with regulatory documentation for home oxygen)  Patient Saturations on Room Air at Rest = 87%  Patient Saturations on Room Air while Ambulating = NT as desat on RA at rest  Patient Saturations on 2 Liters of oxygen while Ambulating = Pt only able to stand up. Could not test ambulation.   Please briefly explain why patient needs home oxygen:Pt desat on RA at rest.  Thanks.   Shateka Petrea,PT Acute Rehabilitation Services Pager:  [phone]  Office:  [phone]

## 2018-12-27 NOTE — Evaluation (Addendum)
Physical Therapy Evaluation Patient Details Name: [name] MRN: [id number] DOB: 12-Sep-1932 Today's Date: 12/27/2018   History of Present Illness  85 y.o. female with past medical history of persistent atrial fibrillation (on Coumadin), chronic diastolic CHF (EF 03-70% by echo in 12/2015), and HTN who is being evaluated for an NSTEMI at the request of Dr. Manus Gunning.  Clinical Impression  Pt admitted with above diagnosis. Pt currently with functional limitations due to the deficits listed below (see PT Problem List). Pt was only able to stand partially and could not achieve full upright stand evenw with mod assist to RW.  Pt needs cues and assist for stability. Of note, pt had 2L O2 on with O2 94%.  Removed O2 and desat to 87% on RA therefore replaced 2L.    Recommend SNF as pt does not appear to have 24 hour care. Will follow acutely.   Pt will benefit from skilled PT to increase their independence and safety with mobility to allow discharge to the venue listed below.      Follow Up Recommendations SNF;Supervision/Assistance - 24 hour    Equipment Recommendations  Other (comment)(TBA)    Recommendations for Other Services       Precautions / Restrictions Precautions Precautions: Fall Restrictions Weight Bearing Restrictions: No      Mobility  Bed Mobility               General bed mobility comments: NT in chair on arrival  Transfers Overall transfer level: Needs assistance Equipment used: Rolling walker (2 wheeled) Transfers: Sit to/from Stand Sit to Stand: Mod assist         General transfer comment: Pt needed mod assist to stand to RW and could not stand all the way up.  Took incr time and pt could not maintain standing.    Ambulation/Gait             General Gait Details: Pt unable  Stairs            Wheelchair Mobility    Modified Rankin (Stroke Patients Only)       Balance Overall balance assessment: Needs assistance          Standing balance support: Bilateral upper extremity supported;During functional activity Standing balance-Leahy Scale: Zero Standing balance comment: unable to achieve full upright stance.                             Pertinent Vitals/Pain Pain Assessment: Faces Faces Pain Scale: Hurts even more Pain Location: "all over" Pain Descriptors / Indicators: Grimacing;Guarding Pain Intervention(s): Limited activity within patient's tolerance;Monitored during session;Repositioned    Home Living Family/patient expects to be discharged to:: Private residence Living Arrangements: Alone;Other relatives;Non-relatives/Friends Available Help at Discharge: Family;Friend(s);Available PRN/intermittently([name] friend and [name]) Type of Home: Apartment Home Access: Level entry     Home Layout: One level Home Equipment: Grab bars - toilet;Grab bars - tub/shower;Walker - 4 wheels;Wheelchair - Geophysical data processor)      Prior Function Level of Independence: Needs assistance   Gait / Transfers Assistance Needed: used rollator per pt   ADL's / Homemaking Assistance Needed: used rolling shower chair and had intermittent help by Melanie in Social worker and friend        Hand Dominance        Extremity/Trunk Assessment   Upper Extremity Assessment Upper Extremity Assessment: Defer to OT evaluation    Lower Extremity Assessment Lower Extremity Assessment: Generalized weakness  Cervical / Trunk Assessment Cervical / Trunk Assessment: Kyphotic  Communication   Communication: No difficulties  Cognition Arousal/Alertness: Awake/alert Behavior During Therapy: WFL for tasks assessed/performed Overall Cognitive Status: Impaired/Different from baseline Area of Impairment: Orientation;Following commands;Safety/judgement;Problem solving                 Orientation Level: Disoriented to;Time;Situation     Following Commands: Follows one step commands with  increased time;Follows one step commands inconsistently Safety/Judgement: Decreased awareness of safety;Decreased awareness of deficits   Problem Solving: Slow processing;Decreased initiation;Difficulty sequencing;Requires verbal cues;Requires tactile cues        General Comments      Exercises     Assessment/Plan    PT Assessment Patient needs continued PT services  PT Problem List Decreased activity tolerance;Decreased balance;Decreased mobility;Decreased knowledge of use of DME;Decreased safety awareness;Decreased knowledge of precautions;Cardiopulmonary status limiting activity       PT Treatment Interventions DME instruction;Gait training;Functional mobility training;Therapeutic activities;Therapeutic exercise;Balance training;Patient/family education    PT Goals (Current goals can be found in the Care Plan section)  Acute Rehab PT Goals Patient Stated Goal: to go home PT Goal Formulation: With patient Time For Goal Achievement: 01/10/19 Potential to Achieve Goals: Good    Frequency Min 2X/week   Barriers to discharge Decreased caregiver support      Co-evaluation               AM-PAC PT "6 Clicks" Mobility  Outcome Measure Help needed turning from your back to your side while in a flat bed without using bedrails?: A Little Help needed moving from lying on your back to sitting on the side of a flat bed without using bedrails?: A Little Help needed moving to and from a bed to a chair (including a wheelchair)?: A Lot Help needed standing up from a chair using your arms (e.g., wheelchair or bedside chair)?: A Lot Help needed to walk in hospital room?: Total Help needed climbing 3-5 steps with a railing? : Total 6 Click Score: 12    End of Session Equipment Utilized During Treatment: Gait belt;Oxygen Activity Tolerance: Patient limited by fatigue Patient left: in chair;with call bell/phone within reach;with chair alarm set Nurse Communication: Mobility  status PT Visit Diagnosis: Unsteadiness on feet (R26.81);Muscle weakness (generalized) (M62.81)    Time: 1131-1150 PT Time Calculation (min) (ACUTE ONLY): 19 min   Charges:   PT Evaluation $PT Eval Moderate Complexity: 1 Mod          Trajan Grove,PT Acute Rehabilitation Services Pager:  [phone]  Office:  [phone]    Berline Lopes 12/27/2018, 1:36 PM

## 2018-12-27 NOTE — Progress Notes (Signed)
Patient's BP=98/68 with recheck of 91/58.  Coreg and Cozaar held.  Georgie Chard, NP notified.

## 2018-12-27 NOTE — Progress Notes (Signed)
ANTICOAGULATION CONSULT NOTE   Pharmacy Consult for Heparin Indication: NSTEMI   Patient Measurements: Height: 5\' 6"  (167.6 cm) Weight: 183 lb 6.4 oz (83.2 kg) IBW/kg (Calculated) : 59.3    Vital Signs: Temp: 102 F (38.9 C) (03/08 2033) Temp Source: Rectal (03/08 2033) BP: 109/68 (03/08 2040) Pulse Rate: 63 (03/08 2040)  Labs: Recent Labs    12/24/18 0543 12/24/18 1202  12/24/18 1408 12/25/18 0411  12/26/18 0517 12/26/18 1509 12/27/18 0017  HGB 12.6  --   --   --  11.9*  --  11.1*  --  10.5*  HCT 39.5  --   --   --  36.8  --  33.5*  --  31.8*  PLT 213  --   --   --  184  --  177  --  165  LABPROT 23.0*  --   --  22.6* 22.5*  --   --   --   --   INR 2.1*  --   --  2.0* 2.0*  --   --   --   --   HEPARINUNFRC  --   --    < > <0.10* <0.10*   < > 0.91* 0.96* 0.96*  CREATININE 0.78  --   --   --  0.81  --   --   --   --   TROPONINI 5.02* 4.81*  --   --   --   --   --   --   --    < > = values in this interval not displayed.    Estimated Creatinine Clearance: 56.2 mL/min (by C-G formula based on SCr of 0.81 mg/dL).   Medical History: Past Medical History:  Diagnosis Date  . Atrial fibrillation (HCC)    a. persistent, on Coumadin for anticoagulation.   . CHF (congestive heart failure) (HCC)    a. EF reduced to 30% by echo in 12/2010 b. EF 50% in 2014 c. improved to 55-60% by repeat imaging in 12/2015  . Chronic airway obstruction, not elsewhere classified   . Esophageal reflux   . Hernia   . Hypertension   . Hyposmolality and/or hyponatremia   . Mitral regurgitation   . Moderate tricuspid regurgitation by prior echocardiogram   . Morbid obesity (HCC)   . Osteoarthrosis, unspecified whether generalized or localized, unspecified site     Medications:  Medications Prior to Admission  Medication Sig Dispense Refill Last Dose  . acetaminophen (TYLENOL) 500 MG tablet Take 500 mg by mouth as needed for mild pain. Taking as needed; only about 2x/week.   Past Week at  Unknown time  . [employer] SENNA 8.6 MG tablet TAKE 1 TABLET BY MOUTH EVERY DAY 30 tablet 8 12/23/2018 at Unknown time  . Cyanocobalamin (B-12 PO) Take by mouth daily. Uses dropper to administer. Unsure about qty.   12/23/2018 at Unknown time  . fluticasone (FLONASE) 50 MCG/ACT nasal spray SPRAY 2 SPRAYS INTO EACH NOSTRIL EVERY DAY 16 g 3 12/23/2018 at Unknown time  . furosemide (LASIX) 20 MG tablet TAKE 1 TABLET BY MOUTH EVERY DAY (Patient taking differently: Take 40 mg by mouth daily. ) 90 tablet 0 12/23/2018 at Unknown time  . losartan (COZAAR) 25 MG tablet Take 25 mg by mouth daily as needed (when blood pressure is not controlled by losartan 50mg ).    Past Month at Unknown time  . losartan (COZAAR) 50 MG tablet TAKE 1 TABLET (50 MG TOTAL) BY MOUTH DAILY. 90 tablet 3  12/23/2018 at Unknown time  . Multiple Vitamins-Minerals (PRESERVISION AREDS 2+MULTI VIT PO) Take 1 capsule by mouth 2 (two) times daily.   12/23/2018 at Unknown time  . neomycin-bacitracin-polymyxin (NEOSPORIN) ointment Apply 1 application topically daily.   12/23/2018 at Unknown time  . polyethylene glycol powder (GLYCOLAX/MIRALAX) powder Take 17 g by mouth daily as needed (for laxative/constipation). 527 g 3 Taking  . Saline (SIMPLY SALINE) 0.9 % AERS Place 1 spray into the nose daily as needed (for congestion).   12/23/2018 at Unknown time  . simethicone (MYLICON) 80 MG chewable tablet Chew 80 mg by mouth as needed for flatulence (takes 2-3 times daily as needed).     . triamcinolone cream (KENALOG) 0.1 % Apply topically at bedtime. 454 g 3 12/23/2018 at Unknown time  . warfarin (COUMADIN) 5 MG tablet TAKE 1/2 TO 1 TABLET DAILY AS DIRECTED BY COUMADIN CLINIC (Patient taking differently: Take 2.5-5 mg by mouth daily. Take 1 tablet by mouth on Tuesdays and Saturdays. On other days, take 1/2 tablet.) 90 tablet 1 12/23/2018 at 2300    Assessment: Pharmacy consulted to dose heparin in patient with NSTEMI.  Patient is on warfarin PTA - INR 2.1.   Heparin level  is therapeutic at 0.96, on 1250 units/hr. INR 3/7therapeuticat 2, despite no dose on 3/6 (last dose on 3/5). Hgb 11.1, plt 177. Troponin 5.02>4.81. No s/sx of bleeding. No infusion issues this morning.  3/9 AM update: heparin level remains elevated, no issues per RN.   Goal of Therapy:  Heparin level 0.3-0.7 units/ml Monitor platelets by anticoagulation protocol: Yes   Plan:  Dec heparin to 900 units/hr Re-check heparin level in 8 hours  Abran Duke, PharmD, BCPS Clinical Pharmacist Phone: [phone]

## 2018-12-27 NOTE — Progress Notes (Signed)
ANTICOAGULATION CONSULT NOTE   Pharmacy Consult for Heparin, coumadin Indication: NSTEMI   Patient Measurements: Height: 5\' 6"  (167.6 cm) Weight: 179 lb 1.6 oz (81.2 kg) IBW/kg (Calculated) : 59.3    Vital Signs: Temp: 98.1 F (36.7 C) (03/09 0836) Temp Source: Oral (03/09 0836) BP: 100/75 (03/09 0836) Pulse Rate: 58 (03/09 0836)  Labs: Recent Labs    12/24/18 1202  12/24/18 1408  12/25/18 0411  12/26/18 0517 12/26/18 1509 12/27/18 0017 12/27/18 0938  HGB  --   --   --    < > 11.9*  --  11.1*  --  10.5*  --   HCT  --   --   --   --  36.8  --  33.5*  --  31.8*  --   PLT  --   --   --   --  184  --  177  --  165  --   LABPROT  --   --  22.6*  --  22.5*  --   --   --  18.9*  --   INR  --   --  2.0*  --  2.0*  --   --   --  1.6*  --   HEPARINUNFRC  --    < > <0.10*  --  <0.10*   < > 0.91* 0.96* 0.96* 0.65  CREATININE  --   --   --   --  0.81  --   --   --   --  0.94  TROPONINI 4.81*  --   --   --   --   --   --   --   --   --    < > = values in this interval not displayed.    Estimated Creatinine Clearance: 47.9 mL/min (by C-G formula based on SCr of 0.94 mg/dL).   Medical History: Past Medical History:  Diagnosis Date  . Atrial fibrillation (HCC)    a. persistent, on Coumadin for anticoagulation.   . CHF (congestive heart failure) (HCC)    a. EF reduced to 30% by echo in 12/2010 b. EF 50% in 2014 c. improved to 55-60% by repeat imaging in 12/2015  . Chronic airway obstruction, not elsewhere classified   . Esophageal reflux   . Hernia   . Hypertension   . Hyposmolality and/or hyponatremia   . Mitral regurgitation   . Moderate tricuspid regurgitation by prior echocardiogram   . Morbid obesity (HCC)   . Osteoarthrosis, unspecified whether generalized or localized, unspecified site     Medications:  Medications Prior to Admission  Medication Sig Dispense Refill Last Dose  . acetaminophen (TYLENOL) 500 MG tablet Take 500 mg by mouth as needed for mild pain.  Taking as needed; only about 2x/week.   Past Week at Unknown time  . [employer] SENNA 8.6 MG tablet TAKE 1 TABLET BY MOUTH EVERY DAY 30 tablet 8 12/23/2018 at Unknown time  . Cyanocobalamin (B-12 PO) Take by mouth daily. Uses dropper to administer. Unsure about qty.   12/23/2018 at Unknown time  . fluticasone (FLONASE) 50 MCG/ACT nasal spray SPRAY 2 SPRAYS INTO EACH NOSTRIL EVERY DAY 16 g 3 12/23/2018 at Unknown time  . furosemide (LASIX) 20 MG tablet TAKE 1 TABLET BY MOUTH EVERY DAY (Patient taking differently: Take 40 mg by mouth daily. ) 90 tablet 0 12/23/2018 at Unknown time  . losartan (COZAAR) 25 MG tablet Take 25 mg by mouth daily as needed (when blood pressure  is not controlled by losartan 50mg ).    Past Month at Unknown time  . losartan (COZAAR) 50 MG tablet TAKE 1 TABLET (50 MG TOTAL) BY MOUTH DAILY. 90 tablet 3 12/23/2018 at Unknown time  . Multiple Vitamins-Minerals (PRESERVISION AREDS 2+MULTI VIT PO) Take 1 capsule by mouth 2 (two) times daily.   12/23/2018 at Unknown time  . neomycin-bacitracin-polymyxin (NEOSPORIN) ointment Apply 1 application topically daily.   12/23/2018 at Unknown time  . polyethylene glycol powder (GLYCOLAX/MIRALAX) powder Take 17 g by mouth daily as needed (for laxative/constipation). 527 g 3 Taking  . Saline (SIMPLY SALINE) 0.9 % AERS Place 1 spray into the nose daily as needed (for congestion).   12/23/2018 at Unknown time  . simethicone (MYLICON) 80 MG chewable tablet Chew 80 mg by mouth as needed for flatulence (takes 2-3 times daily as needed).     . triamcinolone cream (KENALOG) 0.1 % Apply topically at bedtime. 454 g 3 12/23/2018 at Unknown time  . warfarin (COUMADIN) 5 MG tablet TAKE 1/2 TO 1 TABLET DAILY AS DIRECTED BY COUMADIN CLINIC (Patient taking differently: Take 2.5-5 mg by mouth daily. Take 1 tablet by mouth on Tuesdays and Saturdays. On other days, take 1/2 tablet.) 90 tablet 1 12/23/2018 at 2300    Assessment: Pharmacy consulted to dose heparin in patient with NSTEMI.   Patient is on warfarin PTA. Per noted patient is refusing cath and warfarin to restart today - INR 1.6, HL= 0.65, Hg= 10.5  Home warfarin dose: 2.5mg /day except take 5mg  TT  Goal of Therapy:  Heparin level 0.3-0.7 units/ml Monitor platelets by anticoagulation protocol: Yes   Plan:  -No heparin changes needed -Daily CBC and heparin level -Coumadin 5mg  po today -Daily PT/INR  Harland German, PharmD Clinical Pharmacist **Pharmacist phone directory can now be found on [email or web address] ([email or web address]).  Listed under [facility].

## 2018-12-27 NOTE — Progress Notes (Signed)
Progress Note  Patient Name: [name] Date of Encounter: 12/27/2018  Primary Cardiologist: Rollene Rotunda, MD  Subjective   Pt up to chair this AM. Continues to have mild confusion. Biggest complaint is "hurting all over from being in the bed". Echo showed depressed LV function. Denies chest pain or SOB  Inpatient Medications    Scheduled Meds: . aspirin EC  81 mg Oral Daily  . carvedilol  18.75 mg Oral BID WC  . furosemide  40 mg Intravenous Once  . losartan  50 mg Oral Daily  . rosuvastatin  40 mg Oral q1800  . senna  1 tablet Oral QHS   Continuous Infusions: . heparin 900 Units/hr (12/27/18 0723)   PRN Meds: acetaminophen, nitroGLYCERIN, ondansetron (ZOFRAN) IV, simethicone   Vital Signs    Vitals:   12/26/18 2040 12/27/18 0015 12/27/18 0204 12/27/18 0407  BP: 109/68   106/68  Pulse: 63   69  Resp:   18 (!) 25  Temp:  100 F (37.8 C)  97.7 F (36.5 C)  TempSrc:  Axillary  Oral  SpO2: 93%   99%  Weight:    81.2 kg  Height:       No intake or output data in the 24 hours ending 12/27/18 0742 Filed Weights   12/25/18 0615 12/26/18 0544 12/27/18 0407  Weight: 83.6 kg 83.2 kg 81.2 kg    Physical Exam   General: Elderly, NAD Skin: Warm, dry, intact  Head: Normocephalic, atraumatic,  clear, moist mucus membranes. Neck: Negative for carotid bruits. No JVD Lungs:Clear to ausculation bilaterally. No wheezes, rales, or rhonchi. Breathing is unlabored. Cardiovascular: Irregularly irregular with S1 S2. No murmurs, rubs, gallops, or LV heave appreciated. Abdomen: Soft, non-tender, non-distended with normoactive bowel sounds. No obvious abdominal masses. MSK: Strength and tone appear normal for age. 5/5 in all extremities Extremities: No edema. No clubbing or cyanosis. DP/PT pulses 2+ bilaterally Neuro: Alert and oriented to person, place and moderately to situation. No focal deficits. No facial asymmetry. MAE spontaneously. Psych: Responds to questions  appropriately with normal affect.    Labs    Chemistry Recent Labs  Lab 12/24/18 0543 12/25/18 0411  NA 134* 134*  K 3.6 4.0  CL 100 103  CO2 24 21*  GLUCOSE 133* 127*  BUN 16 12  CREATININE 0.78 0.81  CALCIUM 9.3 8.9  PROT 8.1  --   ALBUMIN 3.9  --   AST 37  --   ALT 16  --   ALKPHOS 67  --   BILITOT 1.0  --   GFRNONAA >60 >60  GFRAA >60 >60  ANIONGAP 10 10     Hematology Recent Labs  Lab 12/25/18 0411 12/26/18 0517 12/27/18 0017  WBC 6.8 6.1 5.4  RBC 3.87 3.53* 3.36*  HGB 11.9* 11.1* 10.5*  HCT 36.8 33.5* 31.8*  MCV 95.1 94.9 94.6  MCH 30.7 31.4 31.3  MCHC 32.3 33.1 33.0  RDW 12.8 13.2 13.2  PLT 184 177 165    Cardiac Enzymes Recent Labs  Lab 12/24/18 0543 12/24/18 1202  TROPONINI 5.02* 4.81*   No results for input(s): TROPIPOC in the last 168 hours.   BNP Recent Labs  Lab 12/24/18 0543  BNP 951.0*     DDimer No results for input(s): DDIMER in the last 168 hours.   Radiology    No results found.  Telemetry    12/27/2018 AF with rate 80's  - Personally Reviewed  ECG    No new  tracing as of 12/26/2018- Personally Reviewed  Cardiac Studies   Echocardiogram 12/26/2018:  1. The left ventricle has moderately reduced systolic function, with an ejection fraction of 35-40%. The cavity size was normal. There is mild concentric left ventricular hypertrophy. Left ventricular diastolic Doppler parameters are consistent with  pseudonormalization Elevated left ventricular end-diastolic pressure.  2. The right ventricle has mildly reduced systolic function. The cavity was mildly enlarged. There is no increase in right ventricular wall thickness. Right ventricular systolic pressure is mildly elevated with an estimated pressure of 33.9 mmHg.  3. Left atrial size was mildly dilated.  4. Right atrial size was mildly dilated.  5. The mitral valve is normal in structure.  6. The tricuspid valve is normal in structure.  7. The aortic valve is normal in  structure.  8. The pulmonic valve was normal in structure.  SUMMARY   Very limited study quality, however LVEF is moderately decreased estimated at 35-40% with hypokinesis in the entire anterior and anterolateral walls.  FINDINGS  Left Ventricle: The left ventricle has moderately reduced systolic function, with an ejection fraction of 35-40%. The cavity size was normal. There is mild concentric left ventricular hypertrophy. Left ventricular diastolic Doppler parameters are  consistent with pseudonormalization Elevated left ventricular end-diastolic pressure Definity contrast agent was given IV to delineate the left ventricular endocardial borders. Right Ventricle: The right ventricle has mildly reduced systolic function. The cavity was mildly enlarged. There is no increase in right ventricular wall thickness. Right ventricular systolic pressure is mildly elevated with an estimated pressure of 33.9  mmHg. Left Atrium: left atrial size was mildly dilated Right Atrium: right atrial size was mildly dilated. Right atrial pressure is estimated at 10 mmHg. Interatrial Septum: No atrial level shunt detected by color flow Doppler. Pericardium: There is no evidence of pericardial effusion. Mitral Valve: The mitral valve is normal in structure. Mitral valve regurgitation is not visualized by color flow Doppler. Tricuspid Valve: The tricuspid valve is normal in structure. Tricuspid valve regurgitation is mild by color flow Doppler. Aortic Valve: The aortic valve is normal in structure. Aortic valve regurgitation was not visualized by color flow Doppler. Pulmonic Valve: The pulmonic valve was normal in structure. Pulmonic valve regurgitation is not visualized by color flow Doppler. Venous: The inferior vena cava is normal in size with greater than 50% respiratory variability.  Patient Profile     85 y.o. female with past medical history of persistent atrial fibrillation (on Coumadin), chronic diastolic  CHF (EF 55-60% by echo in 12/2015), and HTN who is being evaluated for an NSTEMI at the request of Dr. Manus Gunning.  Assessment & Plan    1. NSTEMI: -Currently pain free, has some complaints of gas pain which could possibly be her anginal equivalent  -Troponin 0.13>5.02>4.81 -Pt refuses cath for further workup>>echo performed 12/26/2018 -Will continue with medical therapy given advanced age and comorbid conditions>>continue IV Heparin given that she is on chronic coumadin  -Echocardiogram performed 12/26/2018 with LVEF of 35-40% with hypokinesis in the entire anterior and anterolateral walls. Last echo 2017 LVEF of 55-60% with hypokinesis of the mid inferior and mid inferolateral myocardium. -Palliative care meeting 12/26/2018>>continue full code status and plans for rehabilitation with palliative care following at discharge -Continue BB, ASA, losartan, statin   2. Confusion: -Intermittent confusion  -Up to chair today   3. Persistent atrial fibrillation: -Stable with rate control, HR in the 80's today  -On IV Heparin -On home Coumadin   4. Chronic diastolic dysfunction: -Stable without  symptoms -Echocardiogram performed 12/26/2018 with LVEF of 35-40% with hypokinesis of  in the entire anterior and anterolateral walls. Last echo 2017 LVEF of 55-60% with hypokinesis of the mid inferior and mid inferolateral myocardium. -Continue lasix, losartan  -Last creatinine 12/26/2018 at 0.81 -Will obtain BMET today    Signed, Georgie Chard NP-C [facility] Pager: [phone] 12/27/2018, 7:42 AM     For questions or updates, please contact   Please consult [email or web address] for contact info under Cardiology/STEMI.

## 2018-12-27 NOTE — Progress Notes (Signed)
Notified by CCMD that at 0030 pts HR dropped to 38 non sustained.  At 0210 pt also had a 2.07 slow ventricular response. Strips saved. Will continue to monitor. Dierdre Highman, RN

## 2018-12-28 DIAGNOSIS — I4819 Other persistent atrial fibrillation: Secondary | ICD-10-CM

## 2018-12-28 LAB — CBC
HCT: 33.8 % — ABNORMAL LOW (ref 36.0–46.0)
Hemoglobin: 11.1 g/dL — ABNORMAL LOW (ref 12.0–15.0)
MCH: 31.3 pg (ref 26.0–34.0)
MCHC: 32.8 g/dL (ref 30.0–36.0)
MCV: 95.2 fL (ref 80.0–100.0)
Platelets: 177 10*3/uL (ref 150–400)
RBC: 3.55 MIL/uL — ABNORMAL LOW (ref 3.87–5.11)
RDW: 13 % (ref 11.5–15.5)
WBC: 3.9 10*3/uL — ABNORMAL LOW (ref 4.0–10.5)
nRBC: 0 % (ref 0.0–0.2)

## 2018-12-28 LAB — PROTIME-INR
INR: 1.6 — ABNORMAL HIGH (ref 0.8–1.2)
Prothrombin Time: 18.9 seconds — ABNORMAL HIGH (ref 11.4–15.2)

## 2018-12-28 LAB — HEPARIN LEVEL (UNFRACTIONATED): Heparin Unfractionated: 0.67 IU/mL (ref 0.30–0.70)

## 2018-12-28 MED ORDER — ASPIRIN 81 MG PO TBEC: 81.0000 mg | DELAYED_RELEASE_TABLET | Freq: Every day | ORAL | Status: AC

## 2018-12-28 MED ORDER — NITROGLYCERIN 0.4 MG SL SUBL: 0.4000 mg | SUBLINGUAL_TABLET | SUBLINGUAL | 0 refills | Status: AC | PRN

## 2018-12-28 MED ORDER — ROSUVASTATIN CALCIUM 40 MG PO TABS: 40.0000 mg | ORAL_TABLET | Freq: Every day | ORAL | 4 refills | Status: AC

## 2018-12-28 MED ORDER — CARVEDILOL 12.5 MG PO TABS: 12.5000 mg | ORAL_TABLET | Freq: Two times a day (BID) | ORAL | 4 refills | Status: DC

## 2018-12-28 MED ORDER — PANTOPRAZOLE SODIUM 40 MG PO TBEC: 40.0000 mg | DELAYED_RELEASE_TABLET | Freq: Every day | ORAL | 4 refills | Status: AC

## 2018-12-28 MED ORDER — ENOXAPARIN SODIUM 80 MG/0.8ML ~~LOC~~ SOLN
80.0000 mg | Freq: Once | SUBCUTANEOUS | Status: AC
  Administered 2018-12-28: 80 mg via SUBCUTANEOUS
  Filled 2018-12-28: qty 0.8

## 2018-12-28 MED ORDER — WARFARIN SODIUM 7.5 MG PO TABS: 7.5000 mg | ORAL_TABLET | Freq: Once | ORAL | Status: DC

## 2018-12-28 MED ORDER — ENOXAPARIN SODIUM 80 MG/0.8ML ~~LOC~~ SOLN: 1.0000 mg/kg | Freq: Two times a day (BID) | SUBCUTANEOUS | 0 refills | Status: AC

## 2018-12-28 MED ORDER — WARFARIN SODIUM 7.5 MG PO TABS
7.5000 mg | ORAL_TABLET | Freq: Once | ORAL | Status: AC
  Administered 2018-12-28: 7.5 mg via ORAL
  Filled 2018-12-28: qty 1

## 2018-12-28 MED ORDER — FUROSEMIDE 40 MG PO TABS: 40.0000 mg | ORAL_TABLET | Freq: Every day | ORAL | 4 refills | Status: AC

## 2018-12-28 MED FILL — ENOXAPARIN 80 MG/0.8 ML SYR: 80 | 2 days supply | Qty: 2 | Fill #0

## 2018-12-28 NOTE — Progress Notes (Signed)
Pt O2 Sat at 98% on room air at rest while sitting in the chair. Pt ambulated to [facility] and O2 Sat maintained at 97% on room air.

## 2018-12-28 NOTE — TOC Initial Note (Signed)
Transition of Care [facility]) - Initial/Assessment Note    Patient Details  Name: [name] MRN: [id number] Date of Birth: 28-Jul-1932  [facility] CM/SW Contact:    Melanie Emms, LCSW Phone Number: 12/28/2018, 10:42 AM  Clinical Narrative:  CSW, SW intern, and RNCM met with patient at bedside. Patient alert and oriented. CSW and CM discussed disposition planning with patient - recommendation for SNF.   Patient adamantly refused SNF and refused home health care. She stated she is going home today. She says she is not comfortable here at the hospital. Patient reports she has walker and other DME at home already. She has an aide that comes to check on her everyday and helps with cooking, cleaning, and grocery shopping. Her daughter, daughter-in-law, and son all also assist with care and support at home. They take her to doctors appointments. Patient states her son will pick her up at 5 pm today. Patient gave permission to call her son, Melanie. CM advised patient does not yet have discharge orders.                    CSW placed call to patient's son. Left voicemail requesting call back.   Expected Discharge Plan: Home/Self Care([facility] Facility) Barriers to Discharge: Continued Medical Work up   Patient Goals and CMS Choice Patient states their goals for this hospitalization and ongoing recovery are:: Patient wants to go back home.  CMS Medicare.gov Compare Post Acute Care list provided to:: Other (Comment Required)(Patient refused SNF and [facility].) Choice offered to / list presented to : NA  Expected Discharge Plan and Services Expected Discharge Plan: Home/Self Care([facility] Living Senior Facility) Discharge Planning Services: NA Post Acute Care Choice: NA Living arrangements for the past 2 months: Apartment                     HH Arranged: Refused HH    Prior Living Arrangements/Services Living arrangements for the past 2 months: Apartment Lives with::  Self Patient language and need for interpreter reviewed:: No Do you feel safe going back to the place where you live?: Yes      Need for Family Participation in Patient Care: Yes (Comment) Care giver support system in place?: Yes (comment) Current home services: Homehealth aide Criminal Activity/Legal Involvement Pertinent to Current Situation/Hospitalization: No - Comment as needed  Activities of Daily Living      Permission Sought/Granted Permission sought to share information with : Family Supports(Son, Melanie Zou) Permission granted to share information with : Yes, Verbal Permission Granted  Share Information with NAME: [name]           Emotional Assessment Appearance:: Appears stated age Attitude/Demeanor/Rapport: Engaged, Gracious, Apprehensive Affect (typically observed): Pleasant, Apprehensive Orientation: : Oriented to Self, Oriented to  Time, Oriented to Place, Oriented to Situation Alcohol / Substance Use: Not Applicable Psych Involvement: No (comment)  Admission diagnosis:  NSTEMI (non-ST elevated myocardial infarction) [facility]) [I21.4] Patient Active Problem List   Diagnosis Date Noted  . Confusion   . Acute systolic heart failure ([place])   . Ischemic cardiomyopathy   . Palliative care encounter   . NSTEMI (non-ST elevated myocardial infarction) ([place]) 12/24/2018  . RBBB 02/24/2018  . Encounter for therapeutic drug monitoring 12/16/2013  . Tachycardia 01/14/2012  . Moderate tricuspid regurgitation by prior echocardiogram   . CHF (congestive heart failure) ([place])   . Hypertension   . Morbid obesity ([place])   . Mitral regurgitation   .  Atrial fibrillation ([place]) 01/31/2011  . Long term (current) use of anticoagulants 01/31/2011   PCP:  Patient, No Pcp Per Pharmacy:   [id number] [place] [id number]: [phone]: [phone]    Social Determinants  of Health (SDOH) Interventions    Readmission Risk Interventions 30 Day Unplanned Readmission Risk Score     ED to Hosp-Admission (Current) [facility]  30 Day Unplanned Readmission Risk Score (%)  14 Filed at 12/28/2018 0801     This score is the patient's risk of an unplanned readmission within 30 days of being discharged (0 -100%). The score is based on dignosis, age, lab data, medications, orders, and past utilization.   Low:  0-14.9   Medium: 15-21.9   High: 22-29.9   Extreme: 30 and above       No flowsheet data found.

## 2018-12-28 NOTE — Progress Notes (Signed)
ANTICOAGULATION CONSULT NOTE   Pharmacy Consult for Heparin, coumadin Indication: NSTEMI   Patient Measurements: Height: 5\' 6"  (167.6 cm) Weight: (refused weight this AM, RN notified) IBW/kg (Calculated) : 59.3    Vital Signs: Temp: 97.7 F (36.5 C) (03/10 1305) Temp Source: Oral (03/10 0823) BP: 134/83 (03/10 1305) Pulse Rate: 63 (03/10 1305)  Labs: Recent Labs    12/26/18 0517  12/27/18 0017 12/27/18 0938 12/28/18 0415  HGB 11.1*  --  10.5*  --  11.1*  HCT 33.5*  --  31.8*  --  33.8*  PLT 177  --  165  --  177  LABPROT  --   --  18.9*  --  18.9*  INR  --   --  1.6*  --  1.6*  HEPARINUNFRC 0.91*   < > 0.96* 0.65 0.67  CREATININE  --   --   --  0.94  --    < > = values in this interval not displayed.    Estimated Creatinine Clearance: 47.9 mL/min (by C-G formula based on SCr of 0.94 mg/dL).   Medical History: Past Medical History:  Diagnosis Date  . Atrial fibrillation (HCC)    a. persistent, on Coumadin for anticoagulation.   . CHF (congestive heart failure) (HCC)    a. EF reduced to 30% by echo in 12/2010 b. EF 50% in 2014 c. improved to 55-60% by repeat imaging in 12/2015  . Chronic airway obstruction, not elsewhere classified   . Esophageal reflux   . Hernia   . Hypertension   . Hyposmolality and/or hyponatremia   . Mitral regurgitation   . Moderate tricuspid regurgitation by prior echocardiogram   . Morbid obesity (HCC)   . Osteoarthrosis, unspecified whether generalized or localized, unspecified site     Medications:  Medications Prior to Admission  Medication Sig Dispense Refill Last Dose  . acetaminophen (TYLENOL) 500 MG tablet Take 500 mg by mouth as needed for mild pain. Taking as needed; only about 2x/week.   Past Week at Unknown time  . [facility] SENNA 8.6 MG tablet TAKE 1 TABLET BY MOUTH EVERY DAY 30 tablet 8 12/23/2018 at Unknown time  . Cyanocobalamin (B-12 PO) Take by mouth daily. Uses dropper to administer. Unsure about qty.   12/23/2018 at Unknown  time  . fluticasone (FLONASE) 50 MCG/ACT nasal spray SPRAY 2 SPRAYS INTO EACH NOSTRIL EVERY DAY 16 g 3 12/23/2018 at Unknown time  . furosemide (LASIX) 20 MG tablet TAKE 1 TABLET BY MOUTH EVERY DAY (Patient taking differently: Take 40 mg by mouth daily. ) 90 tablet 0 12/23/2018 at Unknown time  . losartan (COZAAR) 25 MG tablet Take 25 mg by mouth daily as needed (when blood pressure is not controlled by losartan 50mg ).    Past Month at Unknown time  . losartan (COZAAR) 50 MG tablet TAKE 1 TABLET (50 MG TOTAL) BY MOUTH DAILY. 90 tablet 3 12/23/2018 at Unknown time  . Multiple Vitamins-Minerals (PRESERVISION AREDS 2+MULTI VIT PO) Take 1 capsule by mouth 2 (two) times daily.   12/23/2018 at Unknown time  . neomycin-bacitracin-polymyxin (NEOSPORIN) ointment Apply 1 application topically daily.   12/23/2018 at Unknown time  . polyethylene glycol powder (GLYCOLAX/MIRALAX) powder Take 17 g by mouth daily as needed (for laxative/constipation). 527 g 3 Taking  . Saline (SIMPLY SALINE) 0.9 % AERS Place 1 spray into the nose daily as needed (for congestion).   12/23/2018 at Unknown time  . simethicone (MYLICON) 80 MG chewable tablet Chew 80 mg by  mouth as needed for flatulence (takes 2-3 times daily as needed).     . triamcinolone cream (KENALOG) 0.1 % Apply topically at bedtime. 454 g 3 12/23/2018 at Unknown time  . warfarin (COUMADIN) 5 MG tablet TAKE 1/2 TO 1 TABLET DAILY AS DIRECTED BY COUMADIN CLINIC (Patient taking differently: Take 2.5-5 mg by mouth daily. Take 1 tablet by mouth on Tuesdays and Saturdays. On other days, take 1/2 tablet.) 90 tablet 1 12/23/2018 at 2300    Assessment: Pharmacy consulted to dose heparin in patient with NSTEMI.  Patient is on warfarin PTA. Per noted patient is refusing cath and warfarin to restart today - INR 1.6, HL= 0.67, Hg= 11.1  Home warfarin dose: 2.5mg /day except take 5mg  TT  Spoke to Georgie Chard NP and plans are to send home on lovenox.   Goal of Therapy:  Heparin level  0.3-0.7 units/ml Monitor platelets by anticoagulation protocol: Yes   Plan:  -Lovenox 80mg  [place] now -coumadin 7.5mg  po now -Continue home dose of warfarin starting 3/11 (2.5mg /day except take 5mg  TT) -Cover with lovenox 80mg   q12 hrs until 3/12 (will need # 3 doses) when she will be seen in clinic for an INR check  Harland German, PharmD Clinical Pharmacist **Pharmacist phone directory can now be found on [email or web address] ([email or web address]).  Listed under [facility].

## 2018-12-28 NOTE — Care Management Important Message (Signed)
Important Message  Patient Details  Name: [name] MRN: [id number] Date of Birth: 21-Jul-1932   Medicare Important Message Given:  Yes    [name] 12/28/2018, 5:58 PM

## 2018-12-28 NOTE — Discharge Summary (Signed)
Discharge Summary    Patient ID: [name] MRN: [id number]; DOB: 02-Dec-1931  Admit date: 12/24/2018 Discharge date: 12/28/2018  Primary Care Provider: Patient, No Pcp Per  Primary Cardiologist: Rollene RotundaJames Hochrein, MD   Discharge Diagnoses    Principal Problem:   NSTEMI (non-ST elevated myocardial infarction) [facility](HCC) Active Problems:   Atrial fibrillation (HCC)   Long term (current) use of anticoagulants   Confusion   Acute systolic heart failure (HCC)   Ischemic cardiomyopathy  Allergies  Diagnostic Studies/Procedures    Echocardiogram 12/26/2018:  1. The left ventricle has moderately reduced systolic function, with an ejection fraction of 35-40%. The cavity size was normal. There is mild concentric left ventricular hypertrophy. Left ventricular diastolic Doppler parameters are consistent with  pseudonormalization Elevated left ventricular end-diastolic pressure. 2. The right ventricle has mildly reduced systolic function. The cavity was mildly enlarged. There is no increase in right ventricular wall thickness. Right ventricular systolic pressure is mildly elevated with an estimated pressure of 33.9 mmHg. 3. Left atrial size was mildly dilated. 4. Right atrial size was mildly dilated. 5. The mitral valve is normal in structure. 6. The tricuspid valve is normal in structure. 7. The aortic valve is normal in structure. 8. The pulmonic valve was normal in structure.  SUMMARY  Very limited study quality, however LVEF is moderately decreased estimated at 35-40% with hypokinesis in the entire anterior and anterolateral walls. FINDINGS Left Ventricle: The left ventricle has moderately reduced systolic function, with an ejection fraction of 35-40%. The cavity size was normal. There is mild concentric left ventricular hypertrophy. Left ventricular diastolic Doppler parameters are  consistent with pseudonormalization Elevated left ventricular end-diastolic pressure  Definity contrast agent was given IV to delineate the left ventricular endocardial borders. Right Ventricle: The right ventricle has mildly reduced systolic function. The cavity was mildly enlarged. There is no increase in right ventricular wall thickness. Right ventricular systolic pressure is mildly elevated with an estimated pressure of 33.9 mmHg. Left Atrium: left atrial size was mildly dilated Right Atrium: right atrial size was mildly dilated. Right atrial pressure is estimated at 10 mmHg. Interatrial Septum: No atrial level shunt detected by color flow Doppler. Pericardium: There is no evidence of pericardial effusion. Mitral Valve: The mitral valve is normal in structure. Mitral valve regurgitation is not visualized by color flow Doppler. Tricuspid Valve: The tricuspid valve is normal in structure. Tricuspid valve regurgitation is mild by color flow Doppler. Aortic Valve: The aortic valve is normal in structure. Aortic valve regurgitation was not visualized by color flow Doppler. Pulmonic Valve: The pulmonic valve was normal in structure. Pulmonic valve regurgitation is not visualized by color flow Doppler. Venous: The inferior vena cava is normal in size with greater than 50% respiratory variability.  History of Present Illness     [name] is a [age] y.o. female with past medical history of persistent atrial fibrillation (on Coumadin), chronic diastolic CHF (EF 40-98%55-60% by echo in 12/2015), and HTN who is being evaluated for an NSTEMI at the request of Dr. Manus Gunningancour.    [name] was last seen by Dr. Antoine PocheHochrein in 09/2018 and reported having recently been evaluated in the ED for altered mental status and was found to have a urinary tract infection. She denied any recent chest pain or dyspnea on exertion at the time of her visit. The only change to her medications at that time was Digoxin was discontinued given her slow rates.  She presented to [facility] ED on 12/24/2018 for the  evaluation  of "gas".  On presenting evaluation, she was a very vague historian however said that she had been having worsening dyspnea on exertion over the last few weeks. She initially accredited this to sinus congestion and had not been evaluated for her symptoms as she does not have a PCP. She reported having a history of "gas" for the past several years but over the past 2 days she developed a fullness sensation along her chest. She denied any specific pain but said that she felt uncomfortable. Her symptoms typically would improve with belching but had not over the past few days. She denied  any associated constipation or diarrhea. She has baseline orthopnea and says she always sleeps propped up with several pillows. Also reported lower extremity edema and states that  She takes Lasix on a daily basis. Unsure of her baseline weight but was at 170 lbs during her office visit in 09/2018.  Initial labs showed WBC 8.5, Hgb 12.6, platelets 213, Na+ 134, K+ 3.6, and creatinine 0.78.  Lipase 24.  BNP 951. INR 2.1. Initial troponin 5.02. CXR shows chronic cardiomegaly with trace pleural effusions.  Abdominal x-ray shows nonobstructive bowel gas pattern. EKG with no acute changes.   Hospital Course     Given the above, she continued to refuse further ischemic workup on presentation with echocardiogram as well as cardiac catheterization. INR was elevated to 2.1 (on chronic Coumadin) limiting the ability to perform cath on admission (if that was planned). Eventually, she agreed to echo which showed LVEF of 35-40% withhypokinesis in the entire anterior and anterolateral walls.Last echo 2017 LVEF of 55-60% with hypokinesis of the mid inferior and mid inferolateral myocardium. She follows with Dr. Antoine Poche for all of her care (has no PCP and only sees an opthamologist). She was stating that she would like his option on whether to proceed with such a procedure before making a decision. She has lots of family around  including a son who is involved with her care. We asked palliative care medicine to be involved with her care during her stay. The patient made the decision to be a full DNR and continuously decline further invasive testing despite clinical evidence of NSTEMI. She reports that this may be something that she would be interested in at a later time, but feels that she is not ready at the moment. She really just wants to go home. See below of a review of her hospital problems.    Hospital problems include:  1. NSTEMI: -Has continuous complaints of gas pain which could possibly be her anginal equivalent>>improved from yesterday  -Troponin 0.13>5.02>4.81 -Pt currently refuses cath however is stating that she would like to go home and "think about it" for a week/month -Echocardiogram performed 12/26/2018 with LVEF of 35-40% withhypokinesis in the entire anterior and anterolateral walls.Last echo 2017 LVEF of 55-60% with hypokinesis of the mid inferior and mid inferolateral myocardium. -Will continue with medical therapy given advanced age and comorbid conditions>>pharmacy currently transitioning off IV hep in anticipation of d/c>>INR today 1.6 -Will bridge with Lovenox>>> Home warfarin dose: 2.5mg /day except take  TT -Cover with lovenox  E. Lopez q12 hrs until 3/12 (will need # 3 doses) when she will be seen in clinic for an INR check -Palliative care meeting again on 12/27/2018 with long discussion about continuing with medical therapy versus aggressive means. Pt wishes to be a full DNR and not proceed with further invasive procedures. She/ understands the risk involved and is ok with this  -Pt addiment about  going home today 12/28/2018 -Continue BB, ASA, losartan, statin   Plan:  -Lovenox 80mg  Dillard now. She will give herself SQ Lovenox injection twice tomorrow (3/11 AM and Pm along with her regular Coumadin dose) then will give herslf the AM dose of Lovenox on Thursday 03/12 and will have an  appointment with INR clinic at 3:30pm on 03/12 -Coumadin 7.5mg  po now>>then resume regular schedule  -Continue home dose of warfarin starting 3/11 (2.5mg /day except take 5mg  TT)   2. Confusion: -Improved, had an episode of confusion secondary to medication given for sleep  -Up to chair again today>>reports being very uncomfortable in the bed   3. Persistent atrial fibrillation: -Stable with rate control, HR in the 80's today -On IV Heparin>>bridging back to home coumadin but INR only 1.6  -On home Coumadin  -Will bridge with Lovenox and have her follow closely in the clinic  -CHA2DS2VASc =3  4. Chronic diastolic dysfunction: -Stable without symptoms -Echocardiogram performed 12/26/2018 with LVEF of 35-40% with hypokinesis ofin the entire anterior and anterolateral walls. Last echo 2017 LVEF of 55-60% with hypokinesis of the mid inferior and mid inferolateral myocardium. -Continue lasix, losartan  -Last creatinine 12/27/2018 0.94 -BMET today with pending results   5. Abdominal Discomfort -Reports a history of "chronic gas" with no recent nausea, vomiting, diarrhea, or constipation however symptoms worsened several days prior to presentation  -LFT's and Lipase WNL -Plain abdominal film showing no acute findings -CT Abdomen with no significant abnormalities>>abdominal aneurysm see below    6. HTN -Stable, 139/88,153/94  -Continue PTA Losartan 50mg  daily and Coreg 18.75 mg BID.   7. Abdominal aortic aneurysm measuring 3.8cm: -Found on abdominal CT this admission with recommendations to ultrasound in 2 years for res-assessment    Consultants: Palliative care medicine   The patient has been seen and examined by Dr. Rennis Golden who feels that she is stable and ready for discharge today, 12/28/2018 _____________  Discharge Vitals Blood pressure 134/83, pulse 63, temperature 97.7 F (36.5 C), resp. rate 16, height 5\' 6"  (1.676 m), weight 81.2 kg, SpO2 99 %.  Filed Weights    12/25/18 0615 12/26/18 0544 12/27/18 0407  Weight: 83.6 kg 83.2 kg 81.2 kg   Labs & Radiologic Studies    CBC Recent Labs    12/27/18 0017 12/28/18 0415  WBC 5.4 3.9*  HGB 10.5* 11.1*  HCT 31.8* 33.8*  MCV 94.6 95.2  PLT 165 177   Basic Metabolic Panel Recent Labs    53/79/43 0938  NA 132*  K 3.5  CL 101  CO2 19*  GLUCOSE 101*  BUN 26*  CREATININE 0.94  CALCIUM 7.9*   _____________  Dg Chest Portable 1 View  Result Date: 12/24/2018 CLINICAL DATA:  Shortness of breath EXAM: PORTABLE CHEST 1 VIEW COMPARISON:  06/17/2018 FINDINGS: Chronic cardiomegaly. Cephalized blood flow and interstitial coarsening above prior baseline. Possible trace effusions. No asymmetric opacity or air bronchograms. Chronic large lung volumes. IMPRESSION: Mild CHF. Electronically Signed   By: Marnee Spring M.D.   On: 12/24/2018 07:13   Dg Abd Portable 2 Views  Result Date: 12/24/2018 CLINICAL DATA:  Shortness of breath EXAM: PORTABLE ABDOMEN - 2 VIEW COMPARISON:  None. FINDINGS: Nonobstructive bowel gas pattern. No abnormal stool retention. No concerning calcification or mass effect. Hip osteoarthritis with asymmetric advanced right narrowing. Lumbar levoscoliosis and degeneration. IMPRESSION: Nonobstructive bowel gas pattern. Electronically Signed   By: Marnee Spring M.D.   On: 12/24/2018 07:14   Ct Angio Chest/abd/pel For Dissection W And/or  Wo Contrast  Result Date: 12/24/2018 CLINICAL DATA:  Chest pain with acute aortic syndrome suspected EXAM: CT ANGIOGRAPHY CHEST, ABDOMEN AND PELVIS TECHNIQUE: Multidetector CT imaging through the chest, abdomen and pelvis was performed using the standard protocol during bolus administration of intravenous contrast. Multiplanar reconstructed images and MIPs were obtained and reviewed to evaluate the vascular anatomy. CONTRAST:  OMNIPAQUE IOHEXOL 350 MG/ML SOLN COMPARISON:  01/16/2011 chest CT.  01/22/2011 abdominal CT FINDINGS: CTA CHEST FINDINGS  Cardiovascular: Noncontrast chest CT shows no acute intramural hematoma. There is extensive atherosclerotic calcification of the aorta and coronaries. Cardiomegaly. No pericardial effusion. Non opacified left atrial appendage. Postcontrast imaging shows no acute aortic dissection. There is an irregular atherosclerotic plaque along the posterior wall of the descending aorta without superimposed inflammation. Mediastinum/Nodes: Negative for adenopathy or hematoma. Lungs/Pleura: Interlobular septal thickening at the right base, likely mild edema. Trace right pleural effusion. 6 mm left upper lobe pulmonary nodule that is stable from 2012. Musculoskeletal: Degenerative changes without acute or aggressive finding Review of the MIP images confirms the above findings. CTA ABDOMEN AND PELVIS FINDINGS VASCULAR Aorta: Diffuse atherosclerotic plaque of the aorta with diffuse fusiform dilatation. Just below the renal arteries is maximal aneurysmal enlargement to 3.8 cm. This segment measured 3 cm previously. No dissection or inflammatory wall thickening. No evidence of rupture or impending rupture. Celiac: Atherosclerotic plaque with ostial stenosis that is at least moderate. No acute finding SMA: Atherosclerotic plaque at the origin with moderate to advanced stenosis. No branch occlusion Renals: Advanced atheromatous narrowing of the proximal right renal artery but symmetric renal enhancement. Poor flow in left segmental branch vessel with cortical scarring. IMA: Patent Inflow: Atherosclerotic plaque without aneurysm.Extensive atherosclerotic irregularity and moderate to advanced right more than left narrowing of the superficial femoral arteries. Veins: No emergent finding in the arterial phase Review of the MIP images confirms the above findings. NON-VASCULAR Hepatobiliary: No focal liver abnormality.No evidence of biliary obstruction or stone. Pancreas: Generalized atrophy. Spleen: Unremarkable. Adrenals/Urinary Tract:  Negative adrenals. No hydronephrosis. Advanced left renal cortical scarring, segmental and likely infarct. Unremarkable bladder. Stomach/Bowel:  No obstruction. No evidence of inflammation Lymphatic: No mass or adenopathy. Reproductive:Hysterectomy Other: No ascites or pneumoperitoneum. Musculoskeletal: Advanced lumbar spine degeneration with scoliosis. Severe hip osteoarthritis with protrusio deformity on the right Review of the MIP images confirms the above findings. IMPRESSION: 1. No evidence of acute aortic syndrome. 2. Extensive atherosclerosis. There is moderate to advanced narrowing at the origins of the celiac and SMA. 3. 3.8 cm abdominal aortic aneurysm. If appropriate for comorbidities, recommend followup by ultrasound in 2 years. This recommendation follows ACR consensus guidelines: White Paper of the ACR Incidental Findings Committee II on Vascular Findings. J Am Coll Radiol 2013; 10:789-794. 4. Non opacified left atrial appendage correlating with history of atrial fibrillation. This could be delayed mixing or thrombus. 5. Cardiomegaly with borderline pulmonary edema and trace right pleural effusion. Electronically Signed   By: Marnee Spring M.D.   On: 12/24/2018 09:02   Disposition   Pt is being discharged home today in good condition.  Follow-up Plans & Appointments    Follow-up Information    Jodelle Gross, NP Follow up on 01/05/2019.   Specialties:  Nurse Practitioner, Radiology, Cardiology Why:  Follow up 01/05/19 at 2pm Contact information: [address] [place] [address] [phone]        [facility] Follow up.   Why:  12/30/2018 at 330pm for INR check  Discharge Instructions    Call MD for:  difficulty breathing, headache or visual disturbances   Complete by:  As directed    Call MD for:  extreme fatigue   Complete by:  As directed    Call MD for:  hives   Complete by:  As directed    Call MD for:  persistant dizziness or  light-headedness   Complete by:  As directed    Call MD for:  persistant nausea and vomiting   Complete by:  As directed    Call MD for:  redness, tenderness, or signs of infection (pain, swelling, redness, odor or green/yellow discharge around incision site)   Complete by:  As directed    Call MD for:  severe uncontrolled pain   Complete by:  As directed    Call MD for:  temperature >100.4   Complete by:  As directed    Diet - low sodium heart healthy   Complete by:  As directed    Discharge instructions   Complete by:  As directed    You will take 1 Lovenox injection starting tomorrow a.m. and again tomorrow p.m. along with you regularly dosed Coumadin.  On Thursday he will again do 1 self Lovenox injection and he will follow with Misty Stanley at the INR clinic on 12/30/2018 at 3:30 PM for an INR check.  You will be given 3 doses of subcutaneous Lovenox.  Coumadin 2.5mg  each day except take  opn Tuesdays and Thursdays   Monitor for bleeding.   Increase activity slowly   Complete by:  As directed      Discharge Medications   Allergies as of 12/28/2018      Reactions   Aspirin Other (See Comments)   Upsets stomach and occasional bleeding. (intolerance)   Garlic Rash   Penicillins          Medication List    TAKE these medications   acetaminophen 500 MG tablet Commonly known as:  TYLENOL Take 500 mg by mouth as needed for mild pain. Taking as needed; only about 2x/week.   aspirin 81 MG EC tablet Take 1 tablet (81 mg total) by mouth daily. Start taking on:  December 29, 2018   B-12 PO Take by mouth daily. Uses dropper to administer. Unsure about qty.   carvedilol 12.5 MG tablet Commonly known as:  COREG Take 1 tablet (12.5 mg total) by mouth 2 (two) times daily with a meal. What changed:  See the new instructions.   [facility] Senna 8.6 MG tablet Generic drug:  senna TAKE 1 TABLET BY MOUTH EVERY DAY   enoxaparin 80 MG/0.8ML injection Commonly known as:  LOVENOX Inject 0.8  mLs (80 mg total) into the skin 2 (two) times daily for 3 doses. Start taking on:  December 29, 2018   fluticasone 50 MCG/ACT nasal spray Commonly known as:  FLONASE SPRAY 2 SPRAYS INTO EACH NOSTRIL EVERY DAY   furosemide 40 MG tablet Commonly known as:  LASIX Take 1 tablet (40 mg total) by mouth daily. Start taking on:  December 29, 2018 What changed:    medication strength  how much to take   losartan 50 MG tablet Commonly known as:  COZAAR TAKE 1 TABLET (50 MG TOTAL) BY MOUTH DAILY. What changed:  Another medication with the same name was removed. Continue taking this medication, and follow the directions you see here.   neomycin-bacitracin-polymyxin ointment Commonly known as:  NEOSPORIN Apply 1 application topically daily.   nitroGLYCERIN 0.4 MG  SL tablet Commonly known as:  NITROSTAT Place 1 tablet (0.4 mg total) under the tongue every 5 (five) minutes x 3 doses as needed for chest pain.   pantoprazole 40 MG tablet Commonly known as:  PROTONIX Take 1 tablet (40 mg total) by mouth daily. Start taking on:  December 29, 2018   polyethylene glycol powder powder Commonly known as:  GLYCOLAX/MIRALAX Take 17 g by mouth daily as needed (for laxative/constipation).   PRESERVISION AREDS 2+MULTI VIT PO Take 1 capsule by mouth 2 (two) times daily.   rosuvastatin 40 MG tablet Commonly known as:  CRESTOR Take 1 tablet (40 mg total) by mouth daily at 6 PM.   simethicone 80 MG chewable tablet Commonly known as:  MYLICON Chew 80 mg by mouth as needed for flatulence (takes 2-3 times daily as needed).   Simply Saline 0.9 % Aers Generic drug:  Saline Place 1 spray into the nose daily as needed (for congestion).   triamcinolone cream 0.1 % Commonly known as:  KENALOG Apply topically at bedtime.   warfarin 5 MG tablet Commonly known as:  COUMADIN Take as directed. If you are unsure how to take this medication, talk to your nurse or doctor. Original instructions:  TAKE 1/2 TO 1  TABLET DAILY AS DIRECTED BY COUMADIN CLINIC What changed:  See the new instructions.        Acute coronary syndrome (MI, NSTEMI, STEMI, etc) this admission?:  NSTEMI with no invasive procedure at pt request    Outstanding Labs/Studies   BMET, CBC  Duration of Discharge Encounter   Greater than 30 minutes including physician time.  Signed, Georgie Chard, NP 12/28/2018, 4:53 PM

## 2018-12-28 NOTE — Progress Notes (Signed)
ANTICOAGULATION CONSULT NOTE   Pharmacy Consult for Heparin, coumadin Indication: NSTEMI   Patient Measurements: Height: 5\' 6"  (167.6 cm) Weight: (refused weight this AM, RN notified) IBW/kg (Calculated) : 59.3    Vital Signs: Temp: 98.1 F (36.7 C) (03/10 0823) Temp Source: Oral (03/10 0823) BP: 112/75 (03/10 0823) Pulse Rate: 78 (03/10 0823)  Labs: Recent Labs    12/26/18 0517  12/27/18 0017 12/27/18 0938 12/28/18 0415  HGB 11.1*  --  10.5*  --  11.1*  HCT 33.5*  --  31.8*  --  33.8*  PLT 177  --  165  --  177  LABPROT  --   --  18.9*  --  18.9*  INR  --   --  1.6*  --  1.6*  HEPARINUNFRC 0.91*   < > 0.96* 0.65 0.67  CREATININE  --   --   --  0.94  --    < > = values in this interval not displayed.    Estimated Creatinine Clearance: 47.9 mL/min (by C-G formula based on SCr of 0.94 mg/dL).   Medical History: Past Medical History:  Diagnosis Date  . Atrial fibrillation (HCC)    a. persistent, on Coumadin for anticoagulation.   . CHF (congestive heart failure) (HCC)    a. EF reduced to 30% by echo in 12/2010 b. EF 50% in 2014 c. improved to 55-60% by repeat imaging in 12/2015  . Chronic airway obstruction, not elsewhere classified   . Esophageal reflux   . Hernia   . Hypertension   . Hyposmolality and/or hyponatremia   . Mitral regurgitation   . Moderate tricuspid regurgitation by prior echocardiogram   . Morbid obesity (HCC)   . Osteoarthrosis, unspecified whether generalized or localized, unspecified site     Medications:  Medications Prior to Admission  Medication Sig Dispense Refill Last Dose  . acetaminophen (TYLENOL) 500 MG tablet Take 500 mg by mouth as needed for mild pain. Taking as needed; only about 2x/week.   Past Week at Unknown time  . [facility] SENNA 8.6 MG tablet TAKE 1 TABLET BY MOUTH EVERY DAY 30 tablet 8 12/23/2018 at Unknown time  . Cyanocobalamin (B-12 PO) Take by mouth daily. Uses dropper to administer. Unsure about qty.   12/23/2018 at Unknown  time  . fluticasone (FLONASE) 50 MCG/ACT nasal spray SPRAY 2 SPRAYS INTO EACH NOSTRIL EVERY DAY 16 g 3 12/23/2018 at Unknown time  . furosemide (LASIX) 20 MG tablet TAKE 1 TABLET BY MOUTH EVERY DAY (Patient taking differently: Take 40 mg by mouth daily. ) 90 tablet 0 12/23/2018 at Unknown time  . losartan (COZAAR) 25 MG tablet Take 25 mg by mouth daily as needed (when blood pressure is not controlled by losartan 50mg ).    Past Month at Unknown time  . losartan (COZAAR) 50 MG tablet TAKE 1 TABLET (50 MG TOTAL) BY MOUTH DAILY. 90 tablet 3 12/23/2018 at Unknown time  . Multiple Vitamins-Minerals (PRESERVISION AREDS 2+MULTI VIT PO) Take 1 capsule by mouth 2 (two) times daily.   12/23/2018 at Unknown time  . neomycin-bacitracin-polymyxin (NEOSPORIN) ointment Apply 1 application topically daily.   12/23/2018 at Unknown time  . polyethylene glycol powder (GLYCOLAX/MIRALAX) powder Take 17 g by mouth daily as needed (for laxative/constipation). 527 g 3 Taking  . Saline (SIMPLY SALINE) 0.9 % AERS Place 1 spray into the nose daily as needed (for congestion).   12/23/2018 at Unknown time  . simethicone (MYLICON) 80 MG chewable tablet Chew 80 mg by  mouth as needed for flatulence (takes 2-3 times daily as needed).     . triamcinolone cream (KENALOG) 0.1 % Apply topically at bedtime. 454 g 3 12/23/2018 at Unknown time  . warfarin (COUMADIN) 5 MG tablet TAKE 1/2 TO 1 TABLET DAILY AS DIRECTED BY COUMADIN CLINIC (Patient taking differently: Take 2.5-5 mg by mouth daily. Take 1 tablet by mouth on Tuesdays and Saturdays. On other days, take 1/2 tablet.) 90 tablet 1 12/23/2018 at 2300    Assessment: Pharmacy consulted to dose heparin in patient with NSTEMI.  Patient is on warfarin PTA. Per noted patient is refusing cath and warfarin to restart today - INR 1.6, HL= 0.67, Hg= 11.1  Home warfarin dose: 2.5mg /day except take 5mg  TT  Goal of Therapy:  Heparin level 0.3-0.7 units/ml Monitor platelets by anticoagulation protocol: Yes    Plan:  -No heparin changes needed -Daily CBC and heparin level -Coumadin 7.5mg  po today -Daily PT/INR  Harland German, PharmD Clinical Pharmacist **Pharmacist phone directory can now be found on [email or web address] ([email or web address]).  Listed under [facility].

## 2018-12-28 NOTE — Progress Notes (Signed)
Discharge instructions reviewed with patient and her son Gene. Both verbalize understanding and also stressed importance of Lovenox injections being given in addition to her Coumadin. Pt states she "has someone who can give me my shot". All belongings taken by patients son Gene and was escorted out in wheelchair.

## 2018-12-28 NOTE — Progress Notes (Signed)
Notified by CCMD Pts HR dropped to 38 non sustained. Will continue to monitor. Dierdre Highman, RN

## 2018-12-28 NOTE — Progress Notes (Addendum)
Progress Note  Patient Name: [name] Date of Encounter: 12/28/2018  Primary Cardiologist: Rollene Rotunda, MD  Subjective   Pt states that she is determined to leave today however will needs to be transitioned back to Coumadin, INR today was only 1.6. Currently DNR and states that she would like to "think about the possibility of cath"  Inpatient Medications    Scheduled Meds: . aspirin EC  81 mg Oral Daily  . calcium carbonate  200 mg of elemental calcium Oral BID  . carvedilol  12.5 mg Oral BID WC  . furosemide  40 mg Oral Daily  . losartan  50 mg Oral Daily  . multivitamin   Oral BID  . pantoprazole  40 mg Oral Daily  . rosuvastatin  40 mg Oral q1800  . senna  1 tablet Oral QHS  . Warfarin - Pharmacist Dosing Inpatient   Does not apply q1800   Continuous Infusions: . heparin 900 Units/hr (12/27/18 0723)   PRN Meds: acetaminophen, nitroGLYCERIN, ondansetron (ZOFRAN) IV, ondansetron, simethicone   Vital Signs    Vitals:   12/27/18 1721 12/27/18 1952 12/27/18 2348 12/28/18 0512  BP: (!) 128/99 120/80 123/68 129/84  Pulse: 73 (!) 59 (!) 47 (!) 59  Resp:  16    Temp:  97.7 F (36.5 C) 98 F (36.7 C) 98 F (36.7 C)  TempSrc:  Oral Oral Oral  SpO2:  98% 96% 98%  Weight:      Height:        Intake/Output Summary (Last 24 hours) at 12/28/2018 0749 Last data filed at 12/28/2018 0416 Gross per 24 hour  Intake 762 ml  Output 400 ml  Net 362 ml   Filed Weights   12/25/18 0615 12/26/18 0544 12/27/18 0407  Weight: 83.6 kg 83.2 kg 81.2 kg    Physical Exam   General: Elderly, NAD Skin: Warm, dry, intact  Head: Normocephalic, atraumatic, clear, moist mucus membranes. Neck: Negative for carotid bruits. No JVD Lungs:Clear to ausculation bilaterally. No wheezes, rales, or rhonchi. Breathing is unlabored. Cardiovascular: Irregularly irregular with S1 S2. No murmurs, rubs, gallops, or LV heave appreciated. Abdomen: Soft, non-tender, non-distended with  normoactive bowel sounds. No obvious abdominal masses. MSK: Strength and tone appear normal for age. 5/5 in all extremities Extremities: No edema. No clubbing or cyanosis. DP/PT pulses 2+ bilaterally Neuro: Alert and oriented. No focal deficits. No facial asymmetry. MAE spontaneously. Psych: Responds to questions appropriately with normal affect.    Labs    Chemistry Recent Labs  Lab 12/24/18 0543 12/25/18 0411 12/27/18 0938  NA 134* 134* 132*  K 3.6 4.0 3.5  CL 100 103 101  CO2 24 21* 19*  GLUCOSE 133* 127* 101*  BUN 16 12 26*  CREATININE 0.78 0.81 0.94  CALCIUM 9.3 8.9 7.9*  PROT 8.1  --   --   ALBUMIN 3.9  --   --   AST 37  --   --   ALT 16  --   --   ALKPHOS 67  --   --   BILITOT 1.0  --   --   GFRNONAA >60 >60 56*  GFRAA >60 >60 >60  ANIONGAP 10 10 12      Hematology Recent Labs  Lab 12/26/18 0517 12/27/18 0017 12/28/18 0415  WBC 6.1 5.4 3.9*  RBC 3.53* 3.36* 3.55*  HGB 11.1* 10.5* 11.1*  HCT 33.5* 31.8* 33.8*  MCV 94.9 94.6 95.2  MCH 31.4 31.3 31.3  MCHC 33.1 33.0 32.8  RDW 13.2 13.2 13.0  PLT 177 165 177    Cardiac Enzymes Recent Labs  Lab 12/24/18 0543 12/24/18 1202  TROPONINI 5.02* 4.81*   No results for input(s): TROPIPOC in the last 168 hours.   BNP Recent Labs  Lab 12/24/18 0543  BNP 951.0*     DDimer No results for input(s): DDIMER in the last 168 hours.   Radiology    No results found.  Telemetry    AF with rate control - Personally Reviewed  ECG    No new tracing as of 12/28/2018 - Personally Reviewed  Cardiac Studies   Echocardiogram 12/26/2018:  1. The left ventricle has moderately reduced systolic function, with an ejection fraction of 35-40%. The cavity size was normal. There is mild concentric left ventricular hypertrophy. Left ventricular diastolic Doppler parameters are consistent with  pseudonormalization Elevated left ventricular end-diastolic pressure. 2. The right ventricle has mildly reduced systolic  function. The cavity was mildly enlarged. There is no increase in right ventricular wall thickness. Right ventricular systolic pressure is mildly elevated with an estimated pressure of 33.9 mmHg. 3. Left atrial size was mildly dilated. 4. Right atrial size was mildly dilated. 5. The mitral valve is normal in structure. 6. The tricuspid valve is normal in structure. 7. The aortic valve is normal in structure. 8. The pulmonic valve was normal in structure.  SUMMARY  Very limited study quality, however LVEF is moderately decreased estimated at 35-40% with hypokinesis in the entire anterior and anterolateral walls. FINDINGS Left Ventricle: The left ventricle has moderately reduced systolic function, with an ejection fraction of 35-40%. The cavity size was normal. There is mild concentric left ventricular hypertrophy. Left ventricular diastolic Doppler parameters are  consistent with pseudonormalization Elevated left ventricular end-diastolic pressure Definity contrast agent was given IV to delineate the left ventricular endocardial borders. Right Ventricle: The right ventricle has mildly reduced systolic function. The cavity was mildly enlarged. There is no increase in right ventricular wall thickness. Right ventricular systolic pressure is mildly elevated with an estimated pressure of 33.9 mmHg. Left Atrium: left atrial size was mildly dilated Right Atrium: right atrial size was mildly dilated. Right atrial pressure is estimated at 10 mmHg. Interatrial Septum: No atrial level shunt detected by color flow Doppler. Pericardium: There is no evidence of pericardial effusion. Mitral Valve: The mitral valve is normal in structure. Mitral valve regurgitation is not visualized by color flow Doppler. Tricuspid Valve: The tricuspid valve is normal in structure. Tricuspid valve regurgitation is mild by color flow Doppler. Aortic Valve: The aortic valve is normal in structure. Aortic valve  regurgitation was not visualized by color flow Doppler. Pulmonic Valve: The pulmonic valve was normal in structure. Pulmonic valve regurgitation is not visualized by color flow Doppler. Venous: The inferior vena cava is normal in size with greater than 50% respiratory variability.  Patient Profile     85 y.o. female with past medical history of persistent atrial fibrillation (on Coumadin), chronic diastolic CHF (EF 72-82% by echo in 12/2015), and HTN who is being evaluated for an NSTEMI at the request of Dr. Manus Gunning.  Assessment & Plan    1. NSTEMI: -Currently pain free, has some complaints of gas pain which could possibly be her anginal equivalent>>improved from yesterday  -Troponin 0.13>5.02>4.81 -Pt currently refuses cath however is stating that she would like to go home and "think about it" for a week/month>>echo performed 12/26/2018 -Will continue with medical therapy given advanced age and comorbid conditions>>continue IV Heparin given that she  is on chronic coumadin>>pharmacy currently transitioning in anticipation of d/c>>INR today 1.6 -Echocardiogram performed 12/26/2018 with LVEF of 35-40% with hypokinesis in the entire anterior and anterolateral walls. Last echo 2017 LVEF of 55-60% with hypokinesis of the mid inferior and mid inferolateral myocardium. -Palliative care meeting again on 12/27/2018 with long discussion about continuing with medical therapy versus aggressive means. Pt wishes to be a full DNR and not proceed with further invasive procedures. She understands the risk involved and is ok with this  -Pt addiment about going home today    -Continue BB, ASA, losartan, statin   2. Confusion: -Improved -Up to chair again today   3. Persistent atrial fibrillation: -Stable with rate control, HR in the 80's today  -On IV Heparin>>bridging back to home coumadin but INR only 1.6  -On home Coumadin   4. Chronic diastolic dysfunction: -Stable without symptoms -Echocardiogram  performed 12/26/2018 with LVEF of 35-40% with hypokinesis of  in the entire anterior and anterolateral walls. Last echo 2017 LVEF of 55-60% with hypokinesis of the mid inferior and mid inferolateral myocardium. -Continue lasix, losartan  -Last creatinine 12/27/2018 0.94 -BMET today with pending results    Signed, Georgie Chard NP-C [facility] Pager: [phone] 12/28/2018, 7:49 AM     For questions or updates, please contact   Please consult [email or web address] for contact info under Cardiology/STEMI. \

## 2018-12-30 NOTE — Telephone Encounter (Signed)
Medical necessity form faxed

## 2019-01-03 DIAGNOSIS — R52 Pain, unspecified: Secondary | ICD-10-CM | POA: Diagnosis not present

## 2019-01-04 DIAGNOSIS — I4891 Unspecified atrial fibrillation: Secondary | ICD-10-CM | POA: Diagnosis not present

## 2019-01-04 DIAGNOSIS — R0600 Dyspnea, unspecified: Secondary | ICD-10-CM | POA: Diagnosis not present

## 2019-01-04 DIAGNOSIS — I509 Heart failure, unspecified: Secondary | ICD-10-CM | POA: Diagnosis not present

## 2019-01-04 DIAGNOSIS — I482 Chronic atrial fibrillation, unspecified: Secondary | ICD-10-CM | POA: Diagnosis not present

## 2019-01-04 DIAGNOSIS — N390 Urinary tract infection, site not specified: Secondary | ICD-10-CM | POA: Diagnosis not present

## 2019-01-04 DIAGNOSIS — Z66 Do not resuscitate: Secondary | ICD-10-CM | POA: Diagnosis not present

## 2019-01-04 DIAGNOSIS — Z0289 Encounter for other administrative examinations: Secondary | ICD-10-CM | POA: Diagnosis not present

## 2019-01-04 DIAGNOSIS — D689 Coagulation defect, unspecified: Secondary | ICD-10-CM | POA: Diagnosis not present

## 2019-01-04 DIAGNOSIS — I214 Non-ST elevation (NSTEMI) myocardial infarction: Secondary | ICD-10-CM | POA: Diagnosis not present

## 2019-01-04 DIAGNOSIS — D688 Other specified coagulation defects: Secondary | ICD-10-CM | POA: Diagnosis not present

## 2019-01-04 DIAGNOSIS — E875 Hyperkalemia: Secondary | ICD-10-CM | POA: Diagnosis not present

## 2019-01-04 DIAGNOSIS — I5022 Chronic systolic (congestive) heart failure: Secondary | ICD-10-CM | POA: Diagnosis not present

## 2019-01-04 DIAGNOSIS — I714 Abdominal aortic aneurysm, without rupture: Secondary | ICD-10-CM | POA: Diagnosis not present

## 2019-01-04 DIAGNOSIS — E876 Hypokalemia: Secondary | ICD-10-CM | POA: Diagnosis not present

## 2019-01-05 ENCOUNTER — Ambulatory Visit: Payer: Medicare Other | Admitting: Adult Health

## 2019-01-05 DIAGNOSIS — I110 Hypertensive heart disease with heart failure: Secondary | ICD-10-CM | POA: Diagnosis present

## 2019-01-05 DIAGNOSIS — Z66 Do not resuscitate: Secondary | ICD-10-CM | POA: Diagnosis not present

## 2019-01-05 DIAGNOSIS — N390 Urinary tract infection, site not specified: Secondary | ICD-10-CM | POA: Diagnosis present

## 2019-01-05 DIAGNOSIS — M1990 Unspecified osteoarthritis, unspecified site: Secondary | ICD-10-CM | POA: Diagnosis present

## 2019-01-05 DIAGNOSIS — I509 Heart failure, unspecified: Secondary | ICD-10-CM | POA: Diagnosis not present

## 2019-01-05 DIAGNOSIS — E876 Hypokalemia: Secondary | ICD-10-CM | POA: Diagnosis present

## 2019-01-05 DIAGNOSIS — I714 Abdominal aortic aneurysm, without rupture: Secondary | ICD-10-CM | POA: Diagnosis not present

## 2019-01-05 DIAGNOSIS — D689 Coagulation defect, unspecified: Secondary | ICD-10-CM | POA: Diagnosis not present

## 2019-01-05 DIAGNOSIS — I5022 Chronic systolic (congestive) heart failure: Secondary | ICD-10-CM | POA: Diagnosis present

## 2019-01-05 DIAGNOSIS — B952 Enterococcus as the cause of diseases classified elsewhere: Secondary | ICD-10-CM | POA: Diagnosis present

## 2019-01-05 DIAGNOSIS — I214 Non-ST elevation (NSTEMI) myocardial infarction: Secondary | ICD-10-CM | POA: Diagnosis not present

## 2019-01-05 DIAGNOSIS — E875 Hyperkalemia: Secondary | ICD-10-CM | POA: Diagnosis not present

## 2019-01-05 DIAGNOSIS — Z7901 Long term (current) use of anticoagulants: Secondary | ICD-10-CM | POA: Diagnosis not present

## 2019-01-05 DIAGNOSIS — I2510 Atherosclerotic heart disease of native coronary artery without angina pectoris: Secondary | ICD-10-CM | POA: Diagnosis present

## 2019-01-05 DIAGNOSIS — I482 Chronic atrial fibrillation, unspecified: Secondary | ICD-10-CM | POA: Diagnosis present

## 2019-01-05 DIAGNOSIS — Z880 Allergy status to penicillin: Secondary | ICD-10-CM | POA: Diagnosis not present

## 2019-01-05 DIAGNOSIS — Z79899 Other long term (current) drug therapy: Secondary | ICD-10-CM | POA: Diagnosis not present

## 2019-01-05 DIAGNOSIS — D688 Other specified coagulation defects: Secondary | ICD-10-CM | POA: Diagnosis present

## 2019-01-05 DIAGNOSIS — I4891 Unspecified atrial fibrillation: Secondary | ICD-10-CM | POA: Diagnosis not present

## 2019-01-06 NOTE — Telephone Encounter (Signed)
Lelon Mast called back today. She said that Dr. Antoine Poche needs to initian and date the changes made to the signature date. She will be re-faxing over the form

## 2019-01-08 DIAGNOSIS — Z7901 Long term (current) use of anticoagulants: Secondary | ICD-10-CM | POA: Diagnosis not present

## 2019-01-08 DIAGNOSIS — R791 Abnormal coagulation profile: Secondary | ICD-10-CM | POA: Diagnosis not present

## 2019-01-08 DIAGNOSIS — I5020 Unspecified systolic (congestive) heart failure: Secondary | ICD-10-CM | POA: Diagnosis not present

## 2019-01-08 DIAGNOSIS — Z5181 Encounter for therapeutic drug level monitoring: Secondary | ICD-10-CM | POA: Diagnosis not present

## 2019-01-08 DIAGNOSIS — I110 Hypertensive heart disease with heart failure: Secondary | ICD-10-CM | POA: Diagnosis not present

## 2019-01-08 DIAGNOSIS — I222 Subsequent non-ST elevation (NSTEMI) myocardial infarction: Secondary | ICD-10-CM | POA: Diagnosis not present

## 2019-01-08 DIAGNOSIS — L89890 Pressure ulcer of other site, unstageable: Secondary | ICD-10-CM | POA: Diagnosis not present

## 2019-01-08 DIAGNOSIS — I4891 Unspecified atrial fibrillation: Secondary | ICD-10-CM | POA: Diagnosis not present

## 2019-01-10 ENCOUNTER — Ambulatory Visit: Payer: Self-pay | Admitting: *Deleted

## 2019-01-10 ENCOUNTER — Telehealth: Payer: Self-pay | Admitting: Pharmacist

## 2019-01-10 DIAGNOSIS — I222 Subsequent non-ST elevation (NSTEMI) myocardial infarction: Secondary | ICD-10-CM | POA: Diagnosis not present

## 2019-01-10 DIAGNOSIS — I4891 Unspecified atrial fibrillation: Secondary | ICD-10-CM | POA: Diagnosis not present

## 2019-01-10 DIAGNOSIS — R791 Abnormal coagulation profile: Secondary | ICD-10-CM | POA: Diagnosis not present

## 2019-01-10 DIAGNOSIS — Z7901 Long term (current) use of anticoagulants: Secondary | ICD-10-CM | POA: Diagnosis not present

## 2019-01-10 DIAGNOSIS — N390 Urinary tract infection, site not specified: Secondary | ICD-10-CM | POA: Diagnosis not present

## 2019-01-10 DIAGNOSIS — L89890 Pressure ulcer of other site, unstageable: Secondary | ICD-10-CM | POA: Diagnosis not present

## 2019-01-10 DIAGNOSIS — I5020 Unspecified systolic (congestive) heart failure: Secondary | ICD-10-CM | POA: Diagnosis not present

## 2019-01-10 DIAGNOSIS — L89622 Pressure ulcer of left heel, stage 2: Secondary | ICD-10-CM | POA: Diagnosis not present

## 2019-01-10 DIAGNOSIS — I110 Hypertensive heart disease with heart failure: Secondary | ICD-10-CM | POA: Diagnosis not present

## 2019-01-10 DIAGNOSIS — Z09 Encounter for follow-up examination after completed treatment for conditions other than malignant neoplasm: Secondary | ICD-10-CM | POA: Diagnosis not present

## 2019-01-10 DIAGNOSIS — I2510 Atherosclerotic heart disease of native coronary artery without angina pectoris: Secondary | ICD-10-CM | POA: Diagnosis not present

## 2019-01-10 NOTE — Telephone Encounter (Signed)
Called pt to prescreen COVID questions for INR check tomorrow. Pt stated that she is now seeing a new cardiologist closer to her home in [place]. They will be monitoring her warfarin as well. Will route to [facility] as an [place]. INR appt for tomorrow has been canceled per pt request.

## 2019-01-11 DIAGNOSIS — I4891 Unspecified atrial fibrillation: Secondary | ICD-10-CM | POA: Diagnosis not present

## 2019-01-11 DIAGNOSIS — I110 Hypertensive heart disease with heart failure: Secondary | ICD-10-CM | POA: Diagnosis not present

## 2019-01-11 DIAGNOSIS — L89890 Pressure ulcer of other site, unstageable: Secondary | ICD-10-CM | POA: Diagnosis not present

## 2019-01-11 DIAGNOSIS — I5020 Unspecified systolic (congestive) heart failure: Secondary | ICD-10-CM | POA: Diagnosis not present

## 2019-01-11 DIAGNOSIS — R791 Abnormal coagulation profile: Secondary | ICD-10-CM | POA: Diagnosis not present

## 2019-01-11 DIAGNOSIS — I222 Subsequent non-ST elevation (NSTEMI) myocardial infarction: Secondary | ICD-10-CM | POA: Diagnosis not present

## 2019-01-13 DIAGNOSIS — I222 Subsequent non-ST elevation (NSTEMI) myocardial infarction: Secondary | ICD-10-CM | POA: Diagnosis not present

## 2019-01-13 DIAGNOSIS — R791 Abnormal coagulation profile: Secondary | ICD-10-CM | POA: Diagnosis not present

## 2019-01-13 DIAGNOSIS — L89890 Pressure ulcer of other site, unstageable: Secondary | ICD-10-CM | POA: Diagnosis not present

## 2019-01-13 DIAGNOSIS — I5020 Unspecified systolic (congestive) heart failure: Secondary | ICD-10-CM | POA: Diagnosis not present

## 2019-01-13 DIAGNOSIS — I4891 Unspecified atrial fibrillation: Secondary | ICD-10-CM | POA: Diagnosis not present

## 2019-01-13 DIAGNOSIS — I110 Hypertensive heart disease with heart failure: Secondary | ICD-10-CM | POA: Diagnosis not present

## 2019-01-14 DIAGNOSIS — I4891 Unspecified atrial fibrillation: Secondary | ICD-10-CM | POA: Diagnosis not present

## 2019-01-14 DIAGNOSIS — I5020 Unspecified systolic (congestive) heart failure: Secondary | ICD-10-CM | POA: Diagnosis not present

## 2019-01-14 DIAGNOSIS — R791 Abnormal coagulation profile: Secondary | ICD-10-CM | POA: Diagnosis not present

## 2019-01-14 DIAGNOSIS — L89890 Pressure ulcer of other site, unstageable: Secondary | ICD-10-CM | POA: Diagnosis not present

## 2019-01-14 DIAGNOSIS — I222 Subsequent non-ST elevation (NSTEMI) myocardial infarction: Secondary | ICD-10-CM | POA: Diagnosis not present

## 2019-01-14 DIAGNOSIS — I110 Hypertensive heart disease with heart failure: Secondary | ICD-10-CM | POA: Diagnosis not present

## 2019-01-16 ENCOUNTER — Other Ambulatory Visit: Payer: Self-pay | Admitting: Cardiology

## 2019-01-17 DIAGNOSIS — I5020 Unspecified systolic (congestive) heart failure: Secondary | ICD-10-CM | POA: Diagnosis not present

## 2019-01-17 DIAGNOSIS — L89890 Pressure ulcer of other site, unstageable: Secondary | ICD-10-CM | POA: Diagnosis not present

## 2019-01-17 DIAGNOSIS — I110 Hypertensive heart disease with heart failure: Secondary | ICD-10-CM | POA: Diagnosis not present

## 2019-01-17 DIAGNOSIS — R791 Abnormal coagulation profile: Secondary | ICD-10-CM | POA: Diagnosis not present

## 2019-01-17 DIAGNOSIS — I4891 Unspecified atrial fibrillation: Secondary | ICD-10-CM | POA: Diagnosis not present

## 2019-01-17 DIAGNOSIS — I222 Subsequent non-ST elevation (NSTEMI) myocardial infarction: Secondary | ICD-10-CM | POA: Diagnosis not present

## 2019-01-18 DIAGNOSIS — I222 Subsequent non-ST elevation (NSTEMI) myocardial infarction: Secondary | ICD-10-CM | POA: Diagnosis not present

## 2019-01-18 DIAGNOSIS — I5020 Unspecified systolic (congestive) heart failure: Secondary | ICD-10-CM | POA: Diagnosis not present

## 2019-01-18 DIAGNOSIS — I4891 Unspecified atrial fibrillation: Secondary | ICD-10-CM | POA: Diagnosis not present

## 2019-01-18 DIAGNOSIS — L89890 Pressure ulcer of other site, unstageable: Secondary | ICD-10-CM | POA: Diagnosis not present

## 2019-01-18 DIAGNOSIS — R791 Abnormal coagulation profile: Secondary | ICD-10-CM | POA: Diagnosis not present

## 2019-01-18 DIAGNOSIS — I110 Hypertensive heart disease with heart failure: Secondary | ICD-10-CM | POA: Diagnosis not present

## 2019-01-19 DIAGNOSIS — L89890 Pressure ulcer of other site, unstageable: Secondary | ICD-10-CM | POA: Diagnosis not present

## 2019-01-19 DIAGNOSIS — I4891 Unspecified atrial fibrillation: Secondary | ICD-10-CM | POA: Diagnosis not present

## 2019-01-19 DIAGNOSIS — I5020 Unspecified systolic (congestive) heart failure: Secondary | ICD-10-CM | POA: Diagnosis not present

## 2019-01-19 DIAGNOSIS — I110 Hypertensive heart disease with heart failure: Secondary | ICD-10-CM | POA: Diagnosis not present

## 2019-01-19 DIAGNOSIS — I222 Subsequent non-ST elevation (NSTEMI) myocardial infarction: Secondary | ICD-10-CM | POA: Diagnosis not present

## 2019-01-19 DIAGNOSIS — R791 Abnormal coagulation profile: Secondary | ICD-10-CM | POA: Diagnosis not present

## 2019-01-24 DIAGNOSIS — I222 Subsequent non-ST elevation (NSTEMI) myocardial infarction: Secondary | ICD-10-CM | POA: Diagnosis not present

## 2019-01-24 DIAGNOSIS — I4891 Unspecified atrial fibrillation: Secondary | ICD-10-CM | POA: Diagnosis not present

## 2019-01-24 DIAGNOSIS — R791 Abnormal coagulation profile: Secondary | ICD-10-CM | POA: Diagnosis not present

## 2019-01-24 DIAGNOSIS — I5020 Unspecified systolic (congestive) heart failure: Secondary | ICD-10-CM | POA: Diagnosis not present

## 2019-01-24 DIAGNOSIS — I110 Hypertensive heart disease with heart failure: Secondary | ICD-10-CM | POA: Diagnosis not present

## 2019-01-24 DIAGNOSIS — L89890 Pressure ulcer of other site, unstageable: Secondary | ICD-10-CM | POA: Diagnosis not present

## 2019-01-27 DIAGNOSIS — I222 Subsequent non-ST elevation (NSTEMI) myocardial infarction: Secondary | ICD-10-CM | POA: Diagnosis not present

## 2019-01-27 DIAGNOSIS — L89890 Pressure ulcer of other site, unstageable: Secondary | ICD-10-CM | POA: Diagnosis not present

## 2019-01-27 DIAGNOSIS — I5020 Unspecified systolic (congestive) heart failure: Secondary | ICD-10-CM | POA: Diagnosis not present

## 2019-01-27 DIAGNOSIS — R791 Abnormal coagulation profile: Secondary | ICD-10-CM | POA: Diagnosis not present

## 2019-01-27 DIAGNOSIS — I4891 Unspecified atrial fibrillation: Secondary | ICD-10-CM | POA: Diagnosis not present

## 2019-01-27 DIAGNOSIS — I110 Hypertensive heart disease with heart failure: Secondary | ICD-10-CM | POA: Diagnosis not present

## 2019-01-28 ENCOUNTER — Telehealth: Payer: Self-pay | Admitting: Cardiology

## 2019-01-28 NOTE — Telephone Encounter (Signed)
New Message    [name] is calling and needs a certificate of medical necessities re faxed  Fax number [phone]

## 2019-01-28 NOTE — Telephone Encounter (Signed)
Requested form re faxed to below number .cy

## 2019-02-02 DIAGNOSIS — I222 Subsequent non-ST elevation (NSTEMI) myocardial infarction: Secondary | ICD-10-CM | POA: Diagnosis not present

## 2019-02-02 DIAGNOSIS — I4891 Unspecified atrial fibrillation: Secondary | ICD-10-CM | POA: Diagnosis not present

## 2019-02-02 DIAGNOSIS — I5020 Unspecified systolic (congestive) heart failure: Secondary | ICD-10-CM | POA: Diagnosis not present

## 2019-02-02 DIAGNOSIS — L89890 Pressure ulcer of other site, unstageable: Secondary | ICD-10-CM | POA: Diagnosis not present

## 2019-02-02 DIAGNOSIS — I110 Hypertensive heart disease with heart failure: Secondary | ICD-10-CM | POA: Diagnosis not present

## 2019-02-02 DIAGNOSIS — R791 Abnormal coagulation profile: Secondary | ICD-10-CM | POA: Diagnosis not present

## 2019-02-03 ENCOUNTER — Telehealth: Payer: Self-pay | Admitting: Cardiology

## 2019-02-03 DIAGNOSIS — I110 Hypertensive heart disease with heart failure: Secondary | ICD-10-CM | POA: Diagnosis not present

## 2019-02-03 DIAGNOSIS — R791 Abnormal coagulation profile: Secondary | ICD-10-CM | POA: Diagnosis not present

## 2019-02-03 DIAGNOSIS — L89890 Pressure ulcer of other site, unstageable: Secondary | ICD-10-CM | POA: Diagnosis not present

## 2019-02-03 DIAGNOSIS — I4891 Unspecified atrial fibrillation: Secondary | ICD-10-CM | POA: Diagnosis not present

## 2019-02-03 DIAGNOSIS — I5020 Unspecified systolic (congestive) heart failure: Secondary | ICD-10-CM | POA: Diagnosis not present

## 2019-02-03 DIAGNOSIS — I222 Subsequent non-ST elevation (NSTEMI) myocardial infarction: Secondary | ICD-10-CM | POA: Diagnosis not present

## 2019-02-03 NOTE — Telephone Encounter (Signed)
New Message            Home care delivered [facility]) is calling to see if  A fax was received for the patient to see when patient was seen last. Pls call to advise.

## 2019-02-03 NOTE — Telephone Encounter (Signed)
Date needed of last office visit ( 10/06/18) on medical necessity this was added and re faxed .Zack Seal

## 2019-02-05 ENCOUNTER — Other Ambulatory Visit: Payer: Self-pay | Admitting: Cardiology

## 2019-02-07 DIAGNOSIS — I5020 Unspecified systolic (congestive) heart failure: Secondary | ICD-10-CM | POA: Diagnosis not present

## 2019-02-07 DIAGNOSIS — Z7901 Long term (current) use of anticoagulants: Secondary | ICD-10-CM | POA: Diagnosis not present

## 2019-02-07 DIAGNOSIS — R791 Abnormal coagulation profile: Secondary | ICD-10-CM | POA: Diagnosis not present

## 2019-02-07 DIAGNOSIS — I222 Subsequent non-ST elevation (NSTEMI) myocardial infarction: Secondary | ICD-10-CM | POA: Diagnosis not present

## 2019-02-07 DIAGNOSIS — Z5181 Encounter for therapeutic drug level monitoring: Secondary | ICD-10-CM | POA: Diagnosis not present

## 2019-02-07 DIAGNOSIS — L89890 Pressure ulcer of other site, unstageable: Secondary | ICD-10-CM | POA: Diagnosis not present

## 2019-02-07 DIAGNOSIS — I4891 Unspecified atrial fibrillation: Secondary | ICD-10-CM | POA: Diagnosis not present

## 2019-02-07 DIAGNOSIS — I110 Hypertensive heart disease with heart failure: Secondary | ICD-10-CM | POA: Diagnosis not present

## 2019-02-10 DIAGNOSIS — I4891 Unspecified atrial fibrillation: Secondary | ICD-10-CM | POA: Diagnosis not present

## 2019-02-10 DIAGNOSIS — R791 Abnormal coagulation profile: Secondary | ICD-10-CM | POA: Diagnosis not present

## 2019-02-10 DIAGNOSIS — I5020 Unspecified systolic (congestive) heart failure: Secondary | ICD-10-CM | POA: Diagnosis not present

## 2019-02-10 DIAGNOSIS — I110 Hypertensive heart disease with heart failure: Secondary | ICD-10-CM | POA: Diagnosis not present

## 2019-02-10 DIAGNOSIS — L89890 Pressure ulcer of other site, unstageable: Secondary | ICD-10-CM | POA: Diagnosis not present

## 2019-02-10 DIAGNOSIS — I222 Subsequent non-ST elevation (NSTEMI) myocardial infarction: Secondary | ICD-10-CM | POA: Diagnosis not present

## 2019-03-03 DIAGNOSIS — I482 Chronic atrial fibrillation, unspecified: Secondary | ICD-10-CM | POA: Diagnosis not present

## 2019-03-03 DIAGNOSIS — I509 Heart failure, unspecified: Secondary | ICD-10-CM | POA: Diagnosis not present

## 2019-03-03 DIAGNOSIS — I214 Non-ST elevation (NSTEMI) myocardial infarction: Secondary | ICD-10-CM | POA: Diagnosis not present

## 2019-03-03 DIAGNOSIS — R791 Abnormal coagulation profile: Secondary | ICD-10-CM | POA: Diagnosis not present

## 2019-03-09 ENCOUNTER — Ambulatory Visit: Payer: Medicare Other | Admitting: Cardiology

## 2019-03-25 DIAGNOSIS — I509 Heart failure, unspecified: Secondary | ICD-10-CM | POA: Diagnosis not present

## 2019-03-28 DIAGNOSIS — I482 Chronic atrial fibrillation, unspecified: Secondary | ICD-10-CM | POA: Diagnosis not present

## 2019-03-28 DIAGNOSIS — I509 Heart failure, unspecified: Secondary | ICD-10-CM | POA: Diagnosis not present

## 2019-03-28 DIAGNOSIS — R791 Abnormal coagulation profile: Secondary | ICD-10-CM | POA: Diagnosis not present

## 2019-03-28 DIAGNOSIS — I214 Non-ST elevation (NSTEMI) myocardial infarction: Secondary | ICD-10-CM | POA: Diagnosis not present

## 2019-04-04 ENCOUNTER — Other Ambulatory Visit: Payer: Self-pay | Admitting: Cardiology

## 2019-04-19 DIAGNOSIS — I739 Peripheral vascular disease, unspecified: Secondary | ICD-10-CM | POA: Diagnosis not present

## 2019-04-19 DIAGNOSIS — L89623 Pressure ulcer of left heel, stage 3: Secondary | ICD-10-CM | POA: Diagnosis not present

## 2019-04-22 DIAGNOSIS — Z7901 Long term (current) use of anticoagulants: Secondary | ICD-10-CM | POA: Diagnosis not present

## 2019-04-22 DIAGNOSIS — I739 Peripheral vascular disease, unspecified: Secondary | ICD-10-CM | POA: Diagnosis not present

## 2019-04-22 DIAGNOSIS — I509 Heart failure, unspecified: Secondary | ICD-10-CM | POA: Diagnosis not present

## 2019-04-22 DIAGNOSIS — I4891 Unspecified atrial fibrillation: Secondary | ICD-10-CM | POA: Diagnosis not present

## 2019-04-22 DIAGNOSIS — L89623 Pressure ulcer of left heel, stage 3: Secondary | ICD-10-CM | POA: Diagnosis not present

## 2019-04-22 DIAGNOSIS — Z7982 Long term (current) use of aspirin: Secondary | ICD-10-CM | POA: Diagnosis not present

## 2019-04-22 DIAGNOSIS — I714 Abdominal aortic aneurysm, without rupture: Secondary | ICD-10-CM | POA: Diagnosis not present

## 2019-04-26 ENCOUNTER — Other Ambulatory Visit: Payer: Self-pay | Admitting: Cardiology

## 2019-04-26 DIAGNOSIS — I714 Abdominal aortic aneurysm, without rupture: Secondary | ICD-10-CM | POA: Diagnosis not present

## 2019-04-26 DIAGNOSIS — I509 Heart failure, unspecified: Secondary | ICD-10-CM | POA: Diagnosis not present

## 2019-04-26 DIAGNOSIS — Z7982 Long term (current) use of aspirin: Secondary | ICD-10-CM | POA: Diagnosis not present

## 2019-04-26 DIAGNOSIS — L89623 Pressure ulcer of left heel, stage 3: Secondary | ICD-10-CM | POA: Diagnosis not present

## 2019-04-26 DIAGNOSIS — I4891 Unspecified atrial fibrillation: Secondary | ICD-10-CM | POA: Diagnosis not present

## 2019-04-26 DIAGNOSIS — I739 Peripheral vascular disease, unspecified: Secondary | ICD-10-CM | POA: Diagnosis not present

## 2019-04-29 DIAGNOSIS — I4891 Unspecified atrial fibrillation: Secondary | ICD-10-CM | POA: Diagnosis not present

## 2019-04-29 DIAGNOSIS — I714 Abdominal aortic aneurysm, without rupture: Secondary | ICD-10-CM | POA: Diagnosis not present

## 2019-04-29 DIAGNOSIS — I509 Heart failure, unspecified: Secondary | ICD-10-CM | POA: Diagnosis not present

## 2019-04-29 DIAGNOSIS — Z7982 Long term (current) use of aspirin: Secondary | ICD-10-CM | POA: Diagnosis not present

## 2019-04-29 DIAGNOSIS — L89623 Pressure ulcer of left heel, stage 3: Secondary | ICD-10-CM | POA: Diagnosis not present

## 2019-04-29 DIAGNOSIS — I739 Peripheral vascular disease, unspecified: Secondary | ICD-10-CM | POA: Diagnosis not present

## 2019-05-02 ENCOUNTER — Other Ambulatory Visit: Payer: Self-pay | Admitting: Cardiology

## 2019-05-03 DIAGNOSIS — I714 Abdominal aortic aneurysm, without rupture: Secondary | ICD-10-CM | POA: Diagnosis not present

## 2019-05-03 DIAGNOSIS — I509 Heart failure, unspecified: Secondary | ICD-10-CM | POA: Diagnosis not present

## 2019-05-03 DIAGNOSIS — I739 Peripheral vascular disease, unspecified: Secondary | ICD-10-CM | POA: Diagnosis not present

## 2019-05-03 DIAGNOSIS — Z7982 Long term (current) use of aspirin: Secondary | ICD-10-CM | POA: Diagnosis not present

## 2019-05-03 DIAGNOSIS — L89623 Pressure ulcer of left heel, stage 3: Secondary | ICD-10-CM | POA: Diagnosis not present

## 2019-05-03 DIAGNOSIS — I4891 Unspecified atrial fibrillation: Secondary | ICD-10-CM | POA: Diagnosis not present

## 2019-05-04 DIAGNOSIS — L89623 Pressure ulcer of left heel, stage 3: Secondary | ICD-10-CM | POA: Diagnosis not present

## 2019-05-04 DIAGNOSIS — I739 Peripheral vascular disease, unspecified: Secondary | ICD-10-CM | POA: Diagnosis not present

## 2019-05-06 DIAGNOSIS — I739 Peripheral vascular disease, unspecified: Secondary | ICD-10-CM | POA: Diagnosis not present

## 2019-05-06 DIAGNOSIS — Z7982 Long term (current) use of aspirin: Secondary | ICD-10-CM | POA: Diagnosis not present

## 2019-05-06 DIAGNOSIS — I4891 Unspecified atrial fibrillation: Secondary | ICD-10-CM | POA: Diagnosis not present

## 2019-05-06 DIAGNOSIS — I509 Heart failure, unspecified: Secondary | ICD-10-CM | POA: Diagnosis not present

## 2019-05-06 DIAGNOSIS — L89623 Pressure ulcer of left heel, stage 3: Secondary | ICD-10-CM | POA: Diagnosis not present

## 2019-05-06 DIAGNOSIS — I714 Abdominal aortic aneurysm, without rupture: Secondary | ICD-10-CM | POA: Diagnosis not present

## 2019-05-10 DIAGNOSIS — Z7982 Long term (current) use of aspirin: Secondary | ICD-10-CM | POA: Diagnosis not present

## 2019-05-10 DIAGNOSIS — I4891 Unspecified atrial fibrillation: Secondary | ICD-10-CM | POA: Diagnosis not present

## 2019-05-10 DIAGNOSIS — I509 Heart failure, unspecified: Secondary | ICD-10-CM | POA: Diagnosis not present

## 2019-05-10 DIAGNOSIS — L89623 Pressure ulcer of left heel, stage 3: Secondary | ICD-10-CM | POA: Diagnosis not present

## 2019-05-10 DIAGNOSIS — I739 Peripheral vascular disease, unspecified: Secondary | ICD-10-CM | POA: Diagnosis not present

## 2019-05-10 DIAGNOSIS — I714 Abdominal aortic aneurysm, without rupture: Secondary | ICD-10-CM | POA: Diagnosis not present

## 2019-05-12 ENCOUNTER — Other Ambulatory Visit: Payer: Self-pay | Admitting: Cardiology

## 2019-05-13 DIAGNOSIS — I714 Abdominal aortic aneurysm, without rupture: Secondary | ICD-10-CM | POA: Diagnosis not present

## 2019-05-13 DIAGNOSIS — I509 Heart failure, unspecified: Secondary | ICD-10-CM | POA: Diagnosis not present

## 2019-05-13 DIAGNOSIS — I4891 Unspecified atrial fibrillation: Secondary | ICD-10-CM | POA: Diagnosis not present

## 2019-05-13 DIAGNOSIS — L89623 Pressure ulcer of left heel, stage 3: Secondary | ICD-10-CM | POA: Diagnosis not present

## 2019-05-13 DIAGNOSIS — Z7982 Long term (current) use of aspirin: Secondary | ICD-10-CM | POA: Diagnosis not present

## 2019-05-13 DIAGNOSIS — I739 Peripheral vascular disease, unspecified: Secondary | ICD-10-CM | POA: Diagnosis not present

## 2019-05-17 DIAGNOSIS — I714 Abdominal aortic aneurysm, without rupture: Secondary | ICD-10-CM | POA: Diagnosis not present

## 2019-05-17 DIAGNOSIS — I739 Peripheral vascular disease, unspecified: Secondary | ICD-10-CM | POA: Diagnosis not present

## 2019-05-17 DIAGNOSIS — L89623 Pressure ulcer of left heel, stage 3: Secondary | ICD-10-CM | POA: Diagnosis not present

## 2019-05-17 DIAGNOSIS — Z7982 Long term (current) use of aspirin: Secondary | ICD-10-CM | POA: Diagnosis not present

## 2019-05-17 DIAGNOSIS — I509 Heart failure, unspecified: Secondary | ICD-10-CM | POA: Diagnosis not present

## 2019-05-17 DIAGNOSIS — I4891 Unspecified atrial fibrillation: Secondary | ICD-10-CM | POA: Diagnosis not present

## 2019-05-20 DIAGNOSIS — I509 Heart failure, unspecified: Secondary | ICD-10-CM | POA: Diagnosis not present

## 2019-05-20 DIAGNOSIS — L89623 Pressure ulcer of left heel, stage 3: Secondary | ICD-10-CM | POA: Diagnosis not present

## 2019-05-20 DIAGNOSIS — I4891 Unspecified atrial fibrillation: Secondary | ICD-10-CM | POA: Diagnosis not present

## 2019-05-20 DIAGNOSIS — Z7982 Long term (current) use of aspirin: Secondary | ICD-10-CM | POA: Diagnosis not present

## 2019-05-20 DIAGNOSIS — I739 Peripheral vascular disease, unspecified: Secondary | ICD-10-CM | POA: Diagnosis not present

## 2019-05-20 DIAGNOSIS — I714 Abdominal aortic aneurysm, without rupture: Secondary | ICD-10-CM | POA: Diagnosis not present

## 2019-05-22 DIAGNOSIS — I714 Abdominal aortic aneurysm, without rupture: Secondary | ICD-10-CM | POA: Diagnosis not present

## 2019-05-22 DIAGNOSIS — L89623 Pressure ulcer of left heel, stage 3: Secondary | ICD-10-CM | POA: Diagnosis not present

## 2019-05-22 DIAGNOSIS — Z7901 Long term (current) use of anticoagulants: Secondary | ICD-10-CM | POA: Diagnosis not present

## 2019-05-22 DIAGNOSIS — I4891 Unspecified atrial fibrillation: Secondary | ICD-10-CM | POA: Diagnosis not present

## 2019-05-22 DIAGNOSIS — Z7982 Long term (current) use of aspirin: Secondary | ICD-10-CM | POA: Diagnosis not present

## 2019-05-22 DIAGNOSIS — I739 Peripheral vascular disease, unspecified: Secondary | ICD-10-CM | POA: Diagnosis not present

## 2019-05-22 DIAGNOSIS — I509 Heart failure, unspecified: Secondary | ICD-10-CM | POA: Diagnosis not present

## 2019-05-24 DIAGNOSIS — Z7982 Long term (current) use of aspirin: Secondary | ICD-10-CM | POA: Diagnosis not present

## 2019-05-24 DIAGNOSIS — I4891 Unspecified atrial fibrillation: Secondary | ICD-10-CM | POA: Diagnosis not present

## 2019-05-24 DIAGNOSIS — L89623 Pressure ulcer of left heel, stage 3: Secondary | ICD-10-CM | POA: Diagnosis not present

## 2019-05-24 DIAGNOSIS — I714 Abdominal aortic aneurysm, without rupture: Secondary | ICD-10-CM | POA: Diagnosis not present

## 2019-05-24 DIAGNOSIS — I509 Heart failure, unspecified: Secondary | ICD-10-CM | POA: Diagnosis not present

## 2019-05-24 DIAGNOSIS — I739 Peripheral vascular disease, unspecified: Secondary | ICD-10-CM | POA: Diagnosis not present

## 2019-05-27 DIAGNOSIS — L89623 Pressure ulcer of left heel, stage 3: Secondary | ICD-10-CM | POA: Diagnosis not present

## 2019-05-27 DIAGNOSIS — I714 Abdominal aortic aneurysm, without rupture: Secondary | ICD-10-CM | POA: Diagnosis not present

## 2019-05-27 DIAGNOSIS — I4891 Unspecified atrial fibrillation: Secondary | ICD-10-CM | POA: Diagnosis not present

## 2019-05-27 DIAGNOSIS — I739 Peripheral vascular disease, unspecified: Secondary | ICD-10-CM | POA: Diagnosis not present

## 2019-05-27 DIAGNOSIS — Z7982 Long term (current) use of aspirin: Secondary | ICD-10-CM | POA: Diagnosis not present

## 2019-05-27 DIAGNOSIS — I509 Heart failure, unspecified: Secondary | ICD-10-CM | POA: Diagnosis not present

## 2019-05-31 DIAGNOSIS — I4891 Unspecified atrial fibrillation: Secondary | ICD-10-CM | POA: Diagnosis not present

## 2019-05-31 DIAGNOSIS — I509 Heart failure, unspecified: Secondary | ICD-10-CM | POA: Diagnosis not present

## 2019-05-31 DIAGNOSIS — I739 Peripheral vascular disease, unspecified: Secondary | ICD-10-CM | POA: Diagnosis not present

## 2019-05-31 DIAGNOSIS — Z7982 Long term (current) use of aspirin: Secondary | ICD-10-CM | POA: Diagnosis not present

## 2019-05-31 DIAGNOSIS — I714 Abdominal aortic aneurysm, without rupture: Secondary | ICD-10-CM | POA: Diagnosis not present

## 2019-05-31 DIAGNOSIS — L89623 Pressure ulcer of left heel, stage 3: Secondary | ICD-10-CM | POA: Diagnosis not present

## 2019-06-03 DIAGNOSIS — Z7982 Long term (current) use of aspirin: Secondary | ICD-10-CM | POA: Diagnosis not present

## 2019-06-03 DIAGNOSIS — I739 Peripheral vascular disease, unspecified: Secondary | ICD-10-CM | POA: Diagnosis not present

## 2019-06-03 DIAGNOSIS — I4891 Unspecified atrial fibrillation: Secondary | ICD-10-CM | POA: Diagnosis not present

## 2019-06-03 DIAGNOSIS — I509 Heart failure, unspecified: Secondary | ICD-10-CM | POA: Diagnosis not present

## 2019-06-03 DIAGNOSIS — I714 Abdominal aortic aneurysm, without rupture: Secondary | ICD-10-CM | POA: Diagnosis not present

## 2019-06-03 DIAGNOSIS — L89623 Pressure ulcer of left heel, stage 3: Secondary | ICD-10-CM | POA: Diagnosis not present

## 2019-06-07 DIAGNOSIS — L89623 Pressure ulcer of left heel, stage 3: Secondary | ICD-10-CM | POA: Diagnosis not present

## 2019-06-07 DIAGNOSIS — I739 Peripheral vascular disease, unspecified: Secondary | ICD-10-CM | POA: Diagnosis not present

## 2019-06-07 DIAGNOSIS — I714 Abdominal aortic aneurysm, without rupture: Secondary | ICD-10-CM | POA: Diagnosis not present

## 2019-06-07 DIAGNOSIS — I4891 Unspecified atrial fibrillation: Secondary | ICD-10-CM | POA: Diagnosis not present

## 2019-06-07 DIAGNOSIS — Z7982 Long term (current) use of aspirin: Secondary | ICD-10-CM | POA: Diagnosis not present

## 2019-06-07 DIAGNOSIS — I509 Heart failure, unspecified: Secondary | ICD-10-CM | POA: Diagnosis not present

## 2019-06-08 DIAGNOSIS — I739 Peripheral vascular disease, unspecified: Secondary | ICD-10-CM | POA: Diagnosis not present

## 2019-06-08 DIAGNOSIS — L89623 Pressure ulcer of left heel, stage 3: Secondary | ICD-10-CM | POA: Diagnosis not present

## 2019-06-09 ENCOUNTER — Other Ambulatory Visit: Payer: Self-pay | Admitting: Cardiology

## 2019-06-10 DIAGNOSIS — I509 Heart failure, unspecified: Secondary | ICD-10-CM | POA: Diagnosis not present

## 2019-06-10 DIAGNOSIS — Z7982 Long term (current) use of aspirin: Secondary | ICD-10-CM | POA: Diagnosis not present

## 2019-06-10 DIAGNOSIS — I739 Peripheral vascular disease, unspecified: Secondary | ICD-10-CM | POA: Diagnosis not present

## 2019-06-10 DIAGNOSIS — I4891 Unspecified atrial fibrillation: Secondary | ICD-10-CM | POA: Diagnosis not present

## 2019-06-10 DIAGNOSIS — L89623 Pressure ulcer of left heel, stage 3: Secondary | ICD-10-CM | POA: Diagnosis not present

## 2019-06-10 DIAGNOSIS — I714 Abdominal aortic aneurysm, without rupture: Secondary | ICD-10-CM | POA: Diagnosis not present

## 2019-06-14 DIAGNOSIS — I739 Peripheral vascular disease, unspecified: Secondary | ICD-10-CM | POA: Diagnosis not present

## 2019-06-14 DIAGNOSIS — I4891 Unspecified atrial fibrillation: Secondary | ICD-10-CM | POA: Diagnosis not present

## 2019-06-14 DIAGNOSIS — Z7982 Long term (current) use of aspirin: Secondary | ICD-10-CM | POA: Diagnosis not present

## 2019-06-14 DIAGNOSIS — I509 Heart failure, unspecified: Secondary | ICD-10-CM | POA: Diagnosis not present

## 2019-06-14 DIAGNOSIS — L89623 Pressure ulcer of left heel, stage 3: Secondary | ICD-10-CM | POA: Diagnosis not present

## 2019-06-14 DIAGNOSIS — I714 Abdominal aortic aneurysm, without rupture: Secondary | ICD-10-CM | POA: Diagnosis not present

## 2019-06-17 DIAGNOSIS — I714 Abdominal aortic aneurysm, without rupture: Secondary | ICD-10-CM | POA: Diagnosis not present

## 2019-06-17 DIAGNOSIS — I4891 Unspecified atrial fibrillation: Secondary | ICD-10-CM | POA: Diagnosis not present

## 2019-06-17 DIAGNOSIS — L89623 Pressure ulcer of left heel, stage 3: Secondary | ICD-10-CM | POA: Diagnosis not present

## 2019-06-17 DIAGNOSIS — I509 Heart failure, unspecified: Secondary | ICD-10-CM | POA: Diagnosis not present

## 2019-06-17 DIAGNOSIS — Z7982 Long term (current) use of aspirin: Secondary | ICD-10-CM | POA: Diagnosis not present

## 2019-06-17 DIAGNOSIS — I739 Peripheral vascular disease, unspecified: Secondary | ICD-10-CM | POA: Diagnosis not present

## 2019-06-21 DIAGNOSIS — I714 Abdominal aortic aneurysm, without rupture: Secondary | ICD-10-CM | POA: Diagnosis not present

## 2019-06-21 DIAGNOSIS — L89623 Pressure ulcer of left heel, stage 3: Secondary | ICD-10-CM | POA: Diagnosis not present

## 2019-06-21 DIAGNOSIS — I509 Heart failure, unspecified: Secondary | ICD-10-CM | POA: Diagnosis not present

## 2019-06-21 DIAGNOSIS — I4891 Unspecified atrial fibrillation: Secondary | ICD-10-CM | POA: Diagnosis not present

## 2019-06-21 DIAGNOSIS — I739 Peripheral vascular disease, unspecified: Secondary | ICD-10-CM | POA: Diagnosis not present

## 2019-06-21 DIAGNOSIS — Z7901 Long term (current) use of anticoagulants: Secondary | ICD-10-CM | POA: Diagnosis not present

## 2019-06-27 ENCOUNTER — Other Ambulatory Visit: Payer: Self-pay | Admitting: Cardiology

## 2019-06-28 DIAGNOSIS — L89623 Pressure ulcer of left heel, stage 3: Secondary | ICD-10-CM | POA: Diagnosis not present

## 2019-06-28 DIAGNOSIS — I4891 Unspecified atrial fibrillation: Secondary | ICD-10-CM | POA: Diagnosis not present

## 2019-06-28 DIAGNOSIS — I509 Heart failure, unspecified: Secondary | ICD-10-CM | POA: Diagnosis not present

## 2019-06-28 DIAGNOSIS — Z7901 Long term (current) use of anticoagulants: Secondary | ICD-10-CM | POA: Diagnosis not present

## 2019-06-28 DIAGNOSIS — I714 Abdominal aortic aneurysm, without rupture: Secondary | ICD-10-CM | POA: Diagnosis not present

## 2019-06-28 DIAGNOSIS — I739 Peripheral vascular disease, unspecified: Secondary | ICD-10-CM | POA: Diagnosis not present

## 2019-06-28 NOTE — Telephone Encounter (Signed)
Please advise if OK to refill nasal spray. Thank you!

## 2019-07-01 DIAGNOSIS — Z7901 Long term (current) use of anticoagulants: Secondary | ICD-10-CM | POA: Diagnosis not present

## 2019-07-01 DIAGNOSIS — L89623 Pressure ulcer of left heel, stage 3: Secondary | ICD-10-CM | POA: Diagnosis not present

## 2019-07-01 DIAGNOSIS — I714 Abdominal aortic aneurysm, without rupture: Secondary | ICD-10-CM | POA: Diagnosis not present

## 2019-07-01 DIAGNOSIS — I4891 Unspecified atrial fibrillation: Secondary | ICD-10-CM | POA: Diagnosis not present

## 2019-07-01 DIAGNOSIS — I509 Heart failure, unspecified: Secondary | ICD-10-CM | POA: Diagnosis not present

## 2019-07-01 DIAGNOSIS — I739 Peripheral vascular disease, unspecified: Secondary | ICD-10-CM | POA: Diagnosis not present

## 2019-07-05 DIAGNOSIS — I714 Abdominal aortic aneurysm, without rupture: Secondary | ICD-10-CM | POA: Diagnosis not present

## 2019-07-05 DIAGNOSIS — I739 Peripheral vascular disease, unspecified: Secondary | ICD-10-CM | POA: Diagnosis not present

## 2019-07-05 DIAGNOSIS — I509 Heart failure, unspecified: Secondary | ICD-10-CM | POA: Diagnosis not present

## 2019-07-05 DIAGNOSIS — Z7901 Long term (current) use of anticoagulants: Secondary | ICD-10-CM | POA: Diagnosis not present

## 2019-07-05 DIAGNOSIS — L89623 Pressure ulcer of left heel, stage 3: Secondary | ICD-10-CM | POA: Diagnosis not present

## 2019-07-05 DIAGNOSIS — I4891 Unspecified atrial fibrillation: Secondary | ICD-10-CM | POA: Diagnosis not present

## 2019-07-07 DIAGNOSIS — Z7901 Long term (current) use of anticoagulants: Secondary | ICD-10-CM | POA: Diagnosis not present

## 2019-07-07 DIAGNOSIS — I714 Abdominal aortic aneurysm, without rupture: Secondary | ICD-10-CM | POA: Diagnosis not present

## 2019-07-07 DIAGNOSIS — I739 Peripheral vascular disease, unspecified: Secondary | ICD-10-CM | POA: Diagnosis not present

## 2019-07-07 DIAGNOSIS — L89623 Pressure ulcer of left heel, stage 3: Secondary | ICD-10-CM | POA: Diagnosis not present

## 2019-07-07 DIAGNOSIS — I4891 Unspecified atrial fibrillation: Secondary | ICD-10-CM | POA: Diagnosis not present

## 2019-07-07 DIAGNOSIS — I509 Heart failure, unspecified: Secondary | ICD-10-CM | POA: Diagnosis not present

## 2019-07-11 DIAGNOSIS — I509 Heart failure, unspecified: Secondary | ICD-10-CM | POA: Diagnosis not present

## 2019-07-11 DIAGNOSIS — I340 Nonrheumatic mitral (valve) insufficiency: Secondary | ICD-10-CM | POA: Diagnosis not present

## 2019-07-12 DIAGNOSIS — I4891 Unspecified atrial fibrillation: Secondary | ICD-10-CM | POA: Diagnosis not present

## 2019-07-12 DIAGNOSIS — I509 Heart failure, unspecified: Secondary | ICD-10-CM | POA: Diagnosis not present

## 2019-07-12 DIAGNOSIS — I739 Peripheral vascular disease, unspecified: Secondary | ICD-10-CM | POA: Diagnosis not present

## 2019-07-12 DIAGNOSIS — Z7901 Long term (current) use of anticoagulants: Secondary | ICD-10-CM | POA: Diagnosis not present

## 2019-07-12 DIAGNOSIS — I714 Abdominal aortic aneurysm, without rupture: Secondary | ICD-10-CM | POA: Diagnosis not present

## 2019-07-12 DIAGNOSIS — L89623 Pressure ulcer of left heel, stage 3: Secondary | ICD-10-CM | POA: Diagnosis not present

## 2019-07-15 DIAGNOSIS — Z7901 Long term (current) use of anticoagulants: Secondary | ICD-10-CM | POA: Diagnosis not present

## 2019-07-15 DIAGNOSIS — I509 Heart failure, unspecified: Secondary | ICD-10-CM | POA: Diagnosis not present

## 2019-07-15 DIAGNOSIS — I4891 Unspecified atrial fibrillation: Secondary | ICD-10-CM | POA: Diagnosis not present

## 2019-07-15 DIAGNOSIS — I714 Abdominal aortic aneurysm, without rupture: Secondary | ICD-10-CM | POA: Diagnosis not present

## 2019-07-15 DIAGNOSIS — L89623 Pressure ulcer of left heel, stage 3: Secondary | ICD-10-CM | POA: Diagnosis not present

## 2019-07-15 DIAGNOSIS — I739 Peripheral vascular disease, unspecified: Secondary | ICD-10-CM | POA: Diagnosis not present

## 2019-07-19 DIAGNOSIS — Z7901 Long term (current) use of anticoagulants: Secondary | ICD-10-CM | POA: Diagnosis not present

## 2019-07-19 DIAGNOSIS — I509 Heart failure, unspecified: Secondary | ICD-10-CM | POA: Diagnosis not present

## 2019-07-19 DIAGNOSIS — L89623 Pressure ulcer of left heel, stage 3: Secondary | ICD-10-CM | POA: Diagnosis not present

## 2019-07-19 DIAGNOSIS — I739 Peripheral vascular disease, unspecified: Secondary | ICD-10-CM | POA: Diagnosis not present

## 2019-07-19 DIAGNOSIS — I4891 Unspecified atrial fibrillation: Secondary | ICD-10-CM | POA: Diagnosis not present

## 2019-07-19 DIAGNOSIS — I714 Abdominal aortic aneurysm, without rupture: Secondary | ICD-10-CM | POA: Diagnosis not present

## 2019-07-20 DIAGNOSIS — R600 Localized edema: Secondary | ICD-10-CM | POA: Diagnosis not present

## 2019-07-20 DIAGNOSIS — I739 Peripheral vascular disease, unspecified: Secondary | ICD-10-CM | POA: Diagnosis not present

## 2019-07-20 DIAGNOSIS — L89623 Pressure ulcer of left heel, stage 3: Secondary | ICD-10-CM | POA: Diagnosis not present

## 2019-07-21 DIAGNOSIS — I4891 Unspecified atrial fibrillation: Secondary | ICD-10-CM | POA: Diagnosis not present

## 2019-07-21 DIAGNOSIS — Z7901 Long term (current) use of anticoagulants: Secondary | ICD-10-CM | POA: Diagnosis not present

## 2019-07-21 DIAGNOSIS — L89623 Pressure ulcer of left heel, stage 3: Secondary | ICD-10-CM | POA: Diagnosis not present

## 2019-07-21 DIAGNOSIS — I739 Peripheral vascular disease, unspecified: Secondary | ICD-10-CM | POA: Diagnosis not present

## 2019-07-21 DIAGNOSIS — I509 Heart failure, unspecified: Secondary | ICD-10-CM | POA: Diagnosis not present

## 2019-07-21 DIAGNOSIS — I714 Abdominal aortic aneurysm, without rupture: Secondary | ICD-10-CM | POA: Diagnosis not present

## 2019-07-26 DIAGNOSIS — I509 Heart failure, unspecified: Secondary | ICD-10-CM | POA: Diagnosis not present

## 2019-07-26 DIAGNOSIS — R791 Abnormal coagulation profile: Secondary | ICD-10-CM | POA: Diagnosis not present

## 2019-07-26 DIAGNOSIS — I482 Chronic atrial fibrillation, unspecified: Secondary | ICD-10-CM | POA: Diagnosis not present

## 2019-07-26 DIAGNOSIS — I214 Non-ST elevation (NSTEMI) myocardial infarction: Secondary | ICD-10-CM | POA: Diagnosis not present

## 2019-07-27 DIAGNOSIS — L89623 Pressure ulcer of left heel, stage 3: Secondary | ICD-10-CM | POA: Diagnosis not present

## 2019-07-27 DIAGNOSIS — I4891 Unspecified atrial fibrillation: Secondary | ICD-10-CM | POA: Diagnosis not present

## 2019-07-27 DIAGNOSIS — Z7901 Long term (current) use of anticoagulants: Secondary | ICD-10-CM | POA: Diagnosis not present

## 2019-07-27 DIAGNOSIS — I509 Heart failure, unspecified: Secondary | ICD-10-CM | POA: Diagnosis not present

## 2019-07-27 DIAGNOSIS — I739 Peripheral vascular disease, unspecified: Secondary | ICD-10-CM | POA: Diagnosis not present

## 2019-07-27 DIAGNOSIS — I714 Abdominal aortic aneurysm, without rupture: Secondary | ICD-10-CM | POA: Diagnosis not present

## 2019-08-01 DIAGNOSIS — L89623 Pressure ulcer of left heel, stage 3: Secondary | ICD-10-CM | POA: Diagnosis not present

## 2019-08-01 DIAGNOSIS — I714 Abdominal aortic aneurysm, without rupture: Secondary | ICD-10-CM | POA: Diagnosis not present

## 2019-08-01 DIAGNOSIS — Z7901 Long term (current) use of anticoagulants: Secondary | ICD-10-CM | POA: Diagnosis not present

## 2019-08-01 DIAGNOSIS — I4891 Unspecified atrial fibrillation: Secondary | ICD-10-CM | POA: Diagnosis not present

## 2019-08-01 DIAGNOSIS — I739 Peripheral vascular disease, unspecified: Secondary | ICD-10-CM | POA: Diagnosis not present

## 2019-08-01 DIAGNOSIS — I509 Heart failure, unspecified: Secondary | ICD-10-CM | POA: Diagnosis not present

## 2019-08-12 DIAGNOSIS — I4891 Unspecified atrial fibrillation: Secondary | ICD-10-CM | POA: Diagnosis not present

## 2019-08-12 DIAGNOSIS — Z7901 Long term (current) use of anticoagulants: Secondary | ICD-10-CM | POA: Diagnosis not present

## 2019-08-12 DIAGNOSIS — I714 Abdominal aortic aneurysm, without rupture: Secondary | ICD-10-CM | POA: Diagnosis not present

## 2019-08-12 DIAGNOSIS — L89623 Pressure ulcer of left heel, stage 3: Secondary | ICD-10-CM | POA: Diagnosis not present

## 2019-08-12 DIAGNOSIS — I509 Heart failure, unspecified: Secondary | ICD-10-CM | POA: Diagnosis not present

## 2019-08-12 DIAGNOSIS — I739 Peripheral vascular disease, unspecified: Secondary | ICD-10-CM | POA: Diagnosis not present

## 2019-08-14 ENCOUNTER — Other Ambulatory Visit: Payer: Self-pay | Admitting: Cardiology

## 2019-08-15 NOTE — Telephone Encounter (Signed)
Senna refill refused. Defer to PCP

## 2019-08-19 DIAGNOSIS — L89623 Pressure ulcer of left heel, stage 3: Secondary | ICD-10-CM | POA: Diagnosis not present

## 2019-08-19 DIAGNOSIS — I509 Heart failure, unspecified: Secondary | ICD-10-CM | POA: Diagnosis not present

## 2019-08-19 DIAGNOSIS — I714 Abdominal aortic aneurysm, without rupture: Secondary | ICD-10-CM | POA: Diagnosis not present

## 2019-08-19 DIAGNOSIS — I4891 Unspecified atrial fibrillation: Secondary | ICD-10-CM | POA: Diagnosis not present

## 2019-08-19 DIAGNOSIS — I739 Peripheral vascular disease, unspecified: Secondary | ICD-10-CM | POA: Diagnosis not present

## 2019-08-19 DIAGNOSIS — Z7901 Long term (current) use of anticoagulants: Secondary | ICD-10-CM | POA: Diagnosis not present

## 2019-08-20 DIAGNOSIS — I4891 Unspecified atrial fibrillation: Secondary | ICD-10-CM | POA: Diagnosis not present

## 2019-08-20 DIAGNOSIS — I714 Abdominal aortic aneurysm, without rupture: Secondary | ICD-10-CM | POA: Diagnosis not present

## 2019-08-20 DIAGNOSIS — S81811D Laceration without foreign body, right lower leg, subsequent encounter: Secondary | ICD-10-CM | POA: Diagnosis not present

## 2019-08-20 DIAGNOSIS — Z7901 Long term (current) use of anticoagulants: Secondary | ICD-10-CM | POA: Diagnosis not present

## 2019-08-20 DIAGNOSIS — I509 Heart failure, unspecified: Secondary | ICD-10-CM | POA: Diagnosis not present

## 2019-08-20 DIAGNOSIS — I739 Peripheral vascular disease, unspecified: Secondary | ICD-10-CM | POA: Diagnosis not present

## 2019-08-24 DIAGNOSIS — I739 Peripheral vascular disease, unspecified: Secondary | ICD-10-CM | POA: Diagnosis not present

## 2019-08-24 DIAGNOSIS — I509 Heart failure, unspecified: Secondary | ICD-10-CM | POA: Diagnosis not present

## 2019-08-24 DIAGNOSIS — Z7901 Long term (current) use of anticoagulants: Secondary | ICD-10-CM | POA: Diagnosis not present

## 2019-08-24 DIAGNOSIS — S81811D Laceration without foreign body, right lower leg, subsequent encounter: Secondary | ICD-10-CM | POA: Diagnosis not present

## 2019-08-24 DIAGNOSIS — I4891 Unspecified atrial fibrillation: Secondary | ICD-10-CM | POA: Diagnosis not present

## 2019-08-24 DIAGNOSIS — I714 Abdominal aortic aneurysm, without rupture: Secondary | ICD-10-CM | POA: Diagnosis not present

## 2019-08-30 DIAGNOSIS — R269 Unspecified abnormalities of gait and mobility: Secondary | ICD-10-CM | POA: Diagnosis not present

## 2019-08-30 DIAGNOSIS — Z7901 Long term (current) use of anticoagulants: Secondary | ICD-10-CM | POA: Diagnosis not present

## 2019-08-30 DIAGNOSIS — I4891 Unspecified atrial fibrillation: Secondary | ICD-10-CM | POA: Diagnosis not present

## 2019-08-30 DIAGNOSIS — I2510 Atherosclerotic heart disease of native coronary artery without angina pectoris: Secondary | ICD-10-CM | POA: Diagnosis not present

## 2019-09-01 DIAGNOSIS — Z7901 Long term (current) use of anticoagulants: Secondary | ICD-10-CM | POA: Diagnosis not present

## 2019-09-01 DIAGNOSIS — I509 Heart failure, unspecified: Secondary | ICD-10-CM | POA: Diagnosis not present

## 2019-09-01 DIAGNOSIS — I739 Peripheral vascular disease, unspecified: Secondary | ICD-10-CM | POA: Diagnosis not present

## 2019-09-01 DIAGNOSIS — I714 Abdominal aortic aneurysm, without rupture: Secondary | ICD-10-CM | POA: Diagnosis not present

## 2019-09-01 DIAGNOSIS — S81811D Laceration without foreign body, right lower leg, subsequent encounter: Secondary | ICD-10-CM | POA: Diagnosis not present

## 2019-09-01 DIAGNOSIS — I4891 Unspecified atrial fibrillation: Secondary | ICD-10-CM | POA: Diagnosis not present

## 2019-09-04 ENCOUNTER — Other Ambulatory Visit: Payer: Self-pay | Admitting: Cardiology

## 2019-09-07 DIAGNOSIS — Z7901 Long term (current) use of anticoagulants: Secondary | ICD-10-CM | POA: Diagnosis not present

## 2019-09-07 DIAGNOSIS — I714 Abdominal aortic aneurysm, without rupture: Secondary | ICD-10-CM | POA: Diagnosis not present

## 2019-09-07 DIAGNOSIS — I739 Peripheral vascular disease, unspecified: Secondary | ICD-10-CM | POA: Diagnosis not present

## 2019-09-07 DIAGNOSIS — I4891 Unspecified atrial fibrillation: Secondary | ICD-10-CM | POA: Diagnosis not present

## 2019-09-07 DIAGNOSIS — S81811D Laceration without foreign body, right lower leg, subsequent encounter: Secondary | ICD-10-CM | POA: Diagnosis not present

## 2019-09-07 DIAGNOSIS — I509 Heart failure, unspecified: Secondary | ICD-10-CM | POA: Diagnosis not present

## 2019-09-07 NOTE — Telephone Encounter (Signed)
Rx(s) sent to pharmacy electronically.  

## 2019-09-14 DIAGNOSIS — I739 Peripheral vascular disease, unspecified: Secondary | ICD-10-CM | POA: Diagnosis not present

## 2019-09-14 DIAGNOSIS — I4891 Unspecified atrial fibrillation: Secondary | ICD-10-CM | POA: Diagnosis not present

## 2019-09-14 DIAGNOSIS — I509 Heart failure, unspecified: Secondary | ICD-10-CM | POA: Diagnosis not present

## 2019-09-14 DIAGNOSIS — S81811D Laceration without foreign body, right lower leg, subsequent encounter: Secondary | ICD-10-CM | POA: Diagnosis not present

## 2019-09-14 DIAGNOSIS — I714 Abdominal aortic aneurysm, without rupture: Secondary | ICD-10-CM | POA: Diagnosis not present

## 2019-09-14 DIAGNOSIS — Z7901 Long term (current) use of anticoagulants: Secondary | ICD-10-CM | POA: Diagnosis not present

## 2019-09-19 DIAGNOSIS — I509 Heart failure, unspecified: Secondary | ICD-10-CM | POA: Diagnosis not present

## 2019-09-19 DIAGNOSIS — I4891 Unspecified atrial fibrillation: Secondary | ICD-10-CM | POA: Diagnosis not present

## 2019-09-19 DIAGNOSIS — Z7901 Long term (current) use of anticoagulants: Secondary | ICD-10-CM | POA: Diagnosis not present

## 2019-09-19 DIAGNOSIS — I714 Abdominal aortic aneurysm, without rupture: Secondary | ICD-10-CM | POA: Diagnosis not present

## 2019-09-19 DIAGNOSIS — I739 Peripheral vascular disease, unspecified: Secondary | ICD-10-CM | POA: Diagnosis not present

## 2019-09-19 DIAGNOSIS — S81811D Laceration without foreign body, right lower leg, subsequent encounter: Secondary | ICD-10-CM | POA: Diagnosis not present

## 2019-09-21 DIAGNOSIS — I509 Heart failure, unspecified: Secondary | ICD-10-CM | POA: Diagnosis not present

## 2019-09-21 DIAGNOSIS — I714 Abdominal aortic aneurysm, without rupture: Secondary | ICD-10-CM | POA: Diagnosis not present

## 2019-09-21 DIAGNOSIS — I739 Peripheral vascular disease, unspecified: Secondary | ICD-10-CM | POA: Diagnosis not present

## 2019-09-21 DIAGNOSIS — Z7901 Long term (current) use of anticoagulants: Secondary | ICD-10-CM | POA: Diagnosis not present

## 2019-09-21 DIAGNOSIS — I4891 Unspecified atrial fibrillation: Secondary | ICD-10-CM | POA: Diagnosis not present

## 2019-09-21 DIAGNOSIS — S81811D Laceration without foreign body, right lower leg, subsequent encounter: Secondary | ICD-10-CM | POA: Diagnosis not present
